# Patient Record
Sex: Female | Born: 1960
Health system: Southern US, Community
[De-identification: ages and names within clinical notes are randomized; demographics above are authoritative.]

## PROBLEM LIST (undated history)

## (undated) DIAGNOSIS — J310 Chronic rhinitis: Secondary | ICD-10-CM

## (undated) DIAGNOSIS — J329 Chronic sinusitis, unspecified: Secondary | ICD-10-CM

## (undated) DIAGNOSIS — R911 Solitary pulmonary nodule: Secondary | ICD-10-CM

## (undated) DIAGNOSIS — Z8719 Personal history of other diseases of the digestive system: Secondary | ICD-10-CM

## (undated) DIAGNOSIS — N201 Calculus of ureter: Secondary | ICD-10-CM

## (undated) DIAGNOSIS — J45909 Unspecified asthma, uncomplicated: Secondary | ICD-10-CM

## (undated) DIAGNOSIS — G4733 Obstructive sleep apnea (adult) (pediatric): Secondary | ICD-10-CM

## (undated) DIAGNOSIS — Z8632 Personal history of gestational diabetes: Secondary | ICD-10-CM

## (undated) DIAGNOSIS — K219 Gastro-esophageal reflux disease without esophagitis: Secondary | ICD-10-CM

## (undated) HISTORY — PX: WISDOM TOOTH EXTRACTION: SHX21

## (undated) HISTORY — PX: TONSILLECTOMY: SUR1361

## (undated) HISTORY — DX: Unspecified asthma, uncomplicated: J45.909

## (undated) HISTORY — DX: Gastro-esophageal reflux disease without esophagitis: K21.9

---

## 2001-05-06 ENCOUNTER — Other Ambulatory Visit: Admission: RE | Admit: 2001-05-06 | Discharge: 2001-05-06 | Payer: Self-pay | Admitting: Family Medicine

## 2005-10-23 ENCOUNTER — Ambulatory Visit: Payer: Self-pay | Admitting: Family Medicine

## 2007-06-15 ENCOUNTER — Ambulatory Visit: Payer: Self-pay | Admitting: Family Medicine

## 2008-11-17 DIAGNOSIS — F419 Anxiety disorder, unspecified: Secondary | ICD-10-CM | POA: Insufficient documentation

## 2008-11-17 DIAGNOSIS — M545 Low back pain, unspecified: Secondary | ICD-10-CM | POA: Insufficient documentation

## 2008-11-17 DIAGNOSIS — K589 Irritable bowel syndrome without diarrhea: Secondary | ICD-10-CM | POA: Insufficient documentation

## 2008-12-06 DIAGNOSIS — E669 Obesity, unspecified: Secondary | ICD-10-CM | POA: Insufficient documentation

## 2008-12-08 DIAGNOSIS — D179 Benign lipomatous neoplasm, unspecified: Secondary | ICD-10-CM | POA: Insufficient documentation

## 2008-12-20 ENCOUNTER — Ambulatory Visit: Payer: Self-pay | Admitting: Family Medicine

## 2009-05-26 ENCOUNTER — Ambulatory Visit: Payer: Self-pay | Admitting: Family Medicine

## 2009-06-30 DIAGNOSIS — J45991 Cough variant asthma: Secondary | ICD-10-CM | POA: Insufficient documentation

## 2010-04-26 DIAGNOSIS — Z86018 Personal history of other benign neoplasm: Secondary | ICD-10-CM

## 2010-04-26 HISTORY — DX: Personal history of other benign neoplasm: Z86.018

## 2010-10-08 ENCOUNTER — Ambulatory Visit: Payer: Self-pay | Admitting: Family Medicine

## 2010-11-19 ENCOUNTER — Ambulatory Visit: Payer: Self-pay | Admitting: Allergy and Immunology

## 2010-11-29 ENCOUNTER — Ambulatory Visit: Payer: Self-pay

## 2011-08-20 ENCOUNTER — Ambulatory Visit: Payer: Self-pay | Admitting: Family Medicine

## 2012-06-03 ENCOUNTER — Ambulatory Visit: Payer: Self-pay | Admitting: Family Medicine

## 2013-03-04 LAB — CBC AND DIFFERENTIAL
HEMATOCRIT: 36 % (ref 36–46)
HEMOGLOBIN: 11 g/dL — AB (ref 12.0–16.0)
NEUTROS ABS: 59 /uL
Platelets: 525 10*3/uL — AB (ref 150–399)
WBC: 11.2 10^3/mL

## 2013-09-03 ENCOUNTER — Other Ambulatory Visit (INDEPENDENT_AMBULATORY_CARE_PROVIDER_SITE_OTHER): Payer: Self-pay | Admitting: Otolaryngology

## 2013-09-03 DIAGNOSIS — J329 Chronic sinusitis, unspecified: Secondary | ICD-10-CM

## 2013-09-08 ENCOUNTER — Encounter: Payer: Self-pay | Admitting: *Deleted

## 2013-09-08 ENCOUNTER — Ambulatory Visit
Admission: RE | Admit: 2013-09-08 | Discharge: 2013-09-08 | Disposition: A | Discharge status: Home / Self Care | Payer: BC Managed Care – PPO | Source: Ambulatory Visit | Attending: Otolaryngology | Admitting: Otolaryngology

## 2013-09-08 DIAGNOSIS — J329 Chronic sinusitis, unspecified: Secondary | ICD-10-CM

## 2013-09-09 ENCOUNTER — Encounter: Payer: Self-pay | Admitting: Internal Medicine

## 2013-09-09 ENCOUNTER — Ambulatory Visit (INDEPENDENT_AMBULATORY_CARE_PROVIDER_SITE_OTHER): Payer: BC Managed Care – PPO | Admitting: Internal Medicine

## 2013-09-09 VITALS — BP 130/84 | HR 74 | Ht 70.0 in | Wt 214.0 lb

## 2013-09-09 DIAGNOSIS — J849 Interstitial pulmonary disease, unspecified: Secondary | ICD-10-CM

## 2013-09-09 DIAGNOSIS — R053 Chronic cough: Secondary | ICD-10-CM

## 2013-09-09 DIAGNOSIS — R05 Cough: Secondary | ICD-10-CM

## 2013-09-09 DIAGNOSIS — R059 Cough, unspecified: Secondary | ICD-10-CM

## 2013-09-09 NOTE — Progress Notes (Signed)
Subjective:    Patient ID: Angela Reyes, female    DOB: 05/10/1961, 52 y.o.   MRN: [id number] PCP No primary provider on file. Referred by Dr Aris Georgia _ allergist HPI   IOV 09/09/2013   Chief Complaint  Patient presents with  . Pulmonary Consult    referred by Dr. Gary Fleet for Asthma    52 year old pleasant female. Referred by Dr. Gary Fleet for recurrent episodes of wheezing. And bronchitis. She reports that 3-4 years ago she presented to Dr. Gary Fleet at that time with chronic cough and shortness of breath. At that time this was in the setting of frequent episodes of bronchitis and wheezing. Apparently spirometry was normal. CT scan of the sinus was normal in 2012. Allergy testing 2-4 years ago at initial intake with Dr. Gary Fleet was normal per her history and per Dr. Gary Fleet e-mail. She started on inhaled corticosteroids but despite this she says that she gets at least 6 episodes of acute bronchitis with wheezing each year requiring antibiotics and prednisone burst.. In between these episodes are not fully clear whether she is fully normal but it appears that she might have some baseline exertional dyspnea and chronic hoarseness of voice. I'm not fully clear if there is chronic cough in between acute episodes because of her variable responses from her. HHowver, looking at RSI cough score between episodes she is having mild cough - COugh score 10 but during episodes cough score si34 and very severe  For  2014 she's had 6 episodes requiring prednisone and antibiotics. Some of this is documented on a piece of paper that she brought along. She gets anywhere from 6-12 days of prednisone and different antibiotics typically Z-Pak and Levaquin. Most recently this is her third episode since August. She is currently finishing a prednisone course.    Other relevant cough history =- Sinus she sees and acid reflux issues present   Relevant investigation history  She has never had a exam nitric oxide because  the machine is in [place] not [place].  She recollects pansinusitis on CT scan of the sinus in 2012. She's had another CT scan of the sinus yesterday in [place] but results are not available  Allergy testing with Dr. Gary Fleet negative a few years ago per history  Normal immunological testing and Dr. Shon Baton office per Dr. Gary Fleet e-mail  Walk test on room air today 185 feet x3 laps. Resting pulse ox 100% with a resting heart rate 71. Peak pulse ox 97% and peak heart rate of 125 suggesting physical deconditioning but no desaturations  Spirometry today: Normal spirometry with FEV1 2.76 L/86%. FEC 3.6 L/89%. Ratio 77/99%.   Dr Gretta Cool Reflux Symptom Index (> 13-15 suggestive of LPR cough) 09/09/2013 bwteen episodes 09/09/2013 During episodes  Hoarseness of problem with voice 2 3  Clearing  Of Throat 2 4  Excess throat mucus or feeling of post nasal drip 1 4  Difficulty swallowing food, liquid or tablets 0 2  Cough after eating or lying down 1 5  Breathing difficulties or choking episodes 1 5  Troublesome or annoying cough 1 5  Sensation of something sticking in throat or lump in throat 1 4  Heartburn, chest pain, indigestion, or stomach acid coming up 1 2  TOTAL 10 34      Past Medical History  Diagnosis Date  . Persistent cough   . GERD (gastroesophageal reflux disease)   . Chronic sinusitis   . Asthma      Family History  Problem Relation Age  of Onset  . Allergies      siblings     History   Social History  . Marital Status: Single    Spouse Name: N/A    Number of Children: N/A  . Years of Education: N/A   Occupational History  . Not on file.   Social History Main Topics  . Smoking status: Never Smoker   . Smokeless tobacco: Not on file  . Alcohol Use: No  . Drug Use: No  . Sexual Activity: Not on file   Other Topics Concern  . Not on file   Social History Narrative  . No narrative on file     No Known Allergies   No outpatient  prescriptions prior to visit.   No facility-administered medications prior to visit.       Review of Systems  Constitutional: Negative for fever, chills, diaphoresis, activity change, appetite change, fatigue and unexpected weight change.  HENT: Positive for congestion and sneezing. Negative for dental problem, ear discharge, ear pain, facial swelling, hearing loss, mouth sores, nosebleeds, postnasal drip, rhinorrhea, sinus pressure, sore throat, tinnitus, trouble swallowing and voice change.   Eyes: Negative for photophobia, discharge, itching and visual disturbance.  Respiratory: Positive for cough and shortness of breath. Negative for apnea, choking, chest tightness, wheezing and stridor.   Cardiovascular: Negative for chest pain, palpitations and leg swelling.  Gastrointestinal: Negative for nausea, vomiting, abdominal pain, constipation, blood in stool and abdominal distention.  Genitourinary: Negative for dysuria, urgency, frequency, hematuria, flank pain, decreased urine volume and difficulty urinating.  Musculoskeletal: Negative for arthralgias, back pain, gait problem, joint swelling, myalgias, neck pain and neck stiffness.  Skin: Negative for color change, pallor and rash.  Neurological: Positive for headaches. Negative for dizziness, tremors, seizures, syncope, speech difficulty, weakness, light-headedness and numbness.  Hematological: Negative for adenopathy. Does not bruise/bleed easily.  Psychiatric/Behavioral: Negative for confusion, sleep disturbance and agitation. The patient is nervous/anxious.        Objective:   Physical Exam  Vitals reviewed. Constitutional: She is oriented to person, place, and time. She appears well-developed and well-nourished. No distress.  Body mass index is 30.71 kg/(m^2).   HENT:  Head: Normocephalic and atraumatic.  Right Ear: External ear normal.  Left Ear: External ear normal.  Mouth/Throat: Oropharynx is clear and moist. No  oropharyngeal exudate.  Chronic hoarse voice + Cleared throat once or twice during exam +  Eyes: Conjunctivae and EOM are normal. Pupils are equal, round, and reactive to light. Right eye exhibits no discharge. Left eye exhibits no discharge. No scleral icterus.  Neck: Normal range of motion. Neck supple. No JVD present. No tracheal deviation present. No thyromegaly present.  Cardiovascular: Normal rate, regular rhythm, normal heart sounds and intact distal pulses.  Exam reveals no gallop and no friction rub.   No murmur heard. Pulmonary/Chest: Effort normal and breath sounds normal. No respiratory distress. She has no wheezes. She has no rales. She exhibits no tenderness.  Abdominal: Soft. Bowel sounds are normal. She exhibits no distension and no mass. There is no tenderness. There is no rebound and no guarding.  Musculoskeletal: Normal range of motion. She exhibits no edema and no tenderness.  Lymphadenopathy:    She has no cervical adenopathy.  Neurological: She is alert and oriented to person, place, and time. She has normal reflexes. No cranial nerve deficit. She exhibits normal muscle tone. Coordination normal.  Skin: Skin is warm and dry. No rash noted. She is not diaphoretic. No erythema. No  pallor.  Psychiatric: She has a normal mood and affect. Her behavior is normal. Judgment and thought content normal.  [place]          Assessment & Plan:

## 2013-09-09 NOTE — Patient Instructions (Addendum)
Unclear cause for your symptoms  I will currently work this up  in frame work of chronic cough and/or recurrent bronchitis/asthma exacerbation  Do full PFT breathing test  Do HRCT chest   High Resolution CT chest without contrast on ILD protocol. Only  Dr Melinda Blietz or Dr. Daniel Entrikin to read  REturn to see me after above mid-dec 2014 

## 2013-09-12 ENCOUNTER — Telehealth: Payer: Self-pay | Admitting: Internal Medicine

## 2013-09-12 DIAGNOSIS — R05 Cough: Secondary | ICD-10-CM | POA: Insufficient documentation

## 2013-09-12 DIAGNOSIS — R053 Chronic cough: Secondary | ICD-10-CM | POA: Insufficient documentation

## 2013-09-12 NOTE — Telephone Encounter (Signed)
Angela Reyes  Please remove ILD as reason for CT chest. REason for CT chest is chronic cough buit it has to be done on HRCT High Resolution CT chest without contrast on ILD protocol. Only  Dr Leanna Battles or Dr. Trudie Reed to read  Indication for test is different from protocol   Thanks  Dr. Kalman Shan, M.D., [facility].C.P Pulmonary and Critical Care Medicine Staff Physician [place] [facility] [place] [facility] Pager: [phone], If no answer or between  15:00h - 7:00h: call [phone]  09/12/2013 2:38 PM

## 2013-09-12 NOTE — Assessment & Plan Note (Signed)
Unclear cause for your symptoms  I will currently work this up  in frame work of chronic cough and/or recurrent bronchitis/asthma exacerbation  Do full PFT breathing test  Do HRCT chest   High Resolution CT chest without contrast on ILD protocol. Only  Dr Leanna Battles or Dr. Trudie Reed to read  REturn to see me after above mid-dec 2014

## 2013-09-14 ENCOUNTER — Ambulatory Visit (INDEPENDENT_AMBULATORY_CARE_PROVIDER_SITE_OTHER)
Admission: RE | Admit: 2013-09-14 | Discharge: 2013-09-14 | Disposition: A | Discharge status: Home / Self Care | Payer: BC Managed Care – PPO | Source: Ambulatory Visit | Attending: Internal Medicine | Admitting: Internal Medicine

## 2013-09-14 ENCOUNTER — Telehealth: Payer: Self-pay | Admitting: Internal Medicine

## 2013-09-14 DIAGNOSIS — J8410 Pulmonary fibrosis, unspecified: Secondary | ICD-10-CM

## 2013-09-14 DIAGNOSIS — J849 Interstitial pulmonary disease, unspecified: Secondary | ICD-10-CM

## 2013-09-14 NOTE — Telephone Encounter (Signed)
Ok np  Dr. Kalman Shan, M.D., F.C.C.P Pulmonary and Critical Care Medicine Staff Physician [place] [facility] [place] [facility] Pager: [phone], If no answer or between  15:00h - 7:00h: call [phone]  09/14/2013 10:41 PM

## 2013-09-14 NOTE — Telephone Encounter (Signed)
Ct without clear cut evidence of reason for her symptoms but good news no fibrosis  One small nodule in RML  reports that she has never smoked. She does not have any smokeless tobacco history on file. Needs fu CT chest in 9 months to rule out cancer but odds are miniscule  Ensure fu to see me aftr PFT; need to see her mid-dec 2014   Dr. Kalman Shan, M.D., [facility].C.P Pulmonary and Critical Care Medicine Staff Physician [place] [facility] [place] [facility] Pager: [phone], If no answer or between  15:00h - 7:00h: call [phone]  09/14/2013 11:30 PM        Ct Chest High Resolution  09/14/2013   CLINICAL DATA:  Newly diagnosed asthma. Recurrent upper respiratory tract infections. Sinusitis. Evaluate for interstitial lung disease.  EXAM: CHEST CT WITHOUT CONTRAST  TECHNIQUE: Multidetector CT imaging of the chest was performed following the standard protocol without intravenous contrast. High resolution imaging of the lungs, as well as inspiratory and expiratory imaging, was performed.  COMPARISON:  No priors.  FINDINGS: Mediastinum: Heart size is normal. There is no significant pericardial fluid, thickening or pericardial calcification. No pathologically enlarged mediastinal or hilar lymph nodes. Please note that accurate exclusion of hilar adenopathy is limited on noncontrast CT scans. Esophagus is unremarkable in appearance.  Lungs/Pleura: 5 mm nodule in the lateral segment of the right middle lobe (image 30 of series 5). 4 mm subpleural nodule in the posterior aspect of the right lower lobe (image 25 of series 5). No other larger more suspicious appearing pulmonary nodules or masses are otherwise noted. No acute consolidative airspace disease. No pleural effusions. High-resolution images demonstrate no areas of significant ground-glass attenuation, subpleural reticulation, parenchymal banding or traction bronchiectasis. Inspiratory and expiratory imaging  demonstrates mild diffuse mosaicism on the expiratory phase, suggesting air trapping related to small airways disease.  Upper Abdomen: Unremarkable.  Musculoskeletal: There are no aggressive appearing lytic or blastic lesions noted in the visualized portions of the skeleton.  IMPRESSION: 1. No findings to suggest an underlying interstitial lung disease. 2. Extensive air trapping indicative of small airways disease. 3. Nonspecific small pulmonary nodules in the right lung, largest of which measures 5 mm in the lateral segment of the right middle lobe. If the patient is at high risk for bronchogenic carcinoma, follow-up chest CT at 6-12 months is recommended. If the patient is at low risk for bronchogenic carcinoma, follow-up chest CT at 12 months is recommended. This recommendation follows the consensus statement: Guidelines for Management of Small Pulmonary Nodules Detected on CT Scans: A Statement from the Fleischner Society as published in Radiology 2005;237:395-400.   Electronically Signed   By: Trudie Reed M.D.   On: 09/14/2013 14:52

## 2013-09-14 NOTE — Telephone Encounter (Signed)
I cannot remove this since CT has already been done. Carron Curie, CMA

## 2013-09-15 NOTE — Telephone Encounter (Signed)
I spoke with the pt and she has an appt for 10-05-13 for PFT at 4pm and then a f/u with MR on 10-11-13 at 10:30. It looks like what happened is the wrong pt was placed in both these slots. A Dr. Maple Hudson pt was scheduled in these slots instead of this pt. I have fixed the schedule and advised the pt to keep the dates she has. Nothing further needed.Carron Curie, CMA

## 2013-09-15 NOTE — Telephone Encounter (Signed)
I spoke with the pt and advised of results. I do not see a PFT scheduled but the pt states she has a date and time for PFT in her calender book. She states she will check this and call me back. The pt needs to be set for a PFT and follow-up with MR after PFT sometime in Mid-Dec 2014, ok to set PFT at [facility] if needed. Will await pt call back. Carron Curie, CMA

## 2013-09-20 ENCOUNTER — Telehealth: Payer: Self-pay | Admitting: Internal Medicine

## 2013-09-20 NOTE — Telephone Encounter (Signed)
Outside spirometry reviewed. Performed 08/24/2013. FEV1 1.9 L/59%. FEC 2.5 L/62% ratio 75/96%. I am not sure that she did a full seconds 6 seconds respiratory maneuver.  Similar restrictive readings on 08/17/2013 with FEV1 1.7 L or 54%. FEC 2.18 L/54%. Pressure of 80/100%. Again it appears that she had her do a whole 6 second maneuver  Dr. Kalman Shan, M.D., [facility].C.P Pulmonary and Critical Care Medicine Staff Physician [place] [facility] [place] [facility] and Critical Care Pager: [phone], If no answer or between  15:00h - 7:00h: call [phone]  09/20/2013 10:22 PM

## 2013-09-20 NOTE — Telephone Encounter (Signed)
This is a self reminder to test the patient for possible vasculitis given findings of CT scan of the sinuses in 09/08/2013 showing acute sinusitis in the sphenoid sinus and sinonasal polyposis in the left maxilla and ethmoid sinus

## 2013-10-05 ENCOUNTER — Ambulatory Visit (INDEPENDENT_AMBULATORY_CARE_PROVIDER_SITE_OTHER): Payer: BC Managed Care – PPO | Admitting: Internal Medicine

## 2013-10-05 DIAGNOSIS — J849 Interstitial pulmonary disease, unspecified: Secondary | ICD-10-CM

## 2013-10-05 DIAGNOSIS — J8410 Pulmonary fibrosis, unspecified: Secondary | ICD-10-CM

## 2013-10-05 LAB — PULMONARY FUNCTION TEST
DL/VA % pred: 91 %
DL/VA: 4.93 ml/min/mmHg/L
FEF 25-75 Pre: 2.75 L/sec
FEF2575-%Change-Post: 16 %
FEF2575-%Pred-Post: 104 %
FEF2575-%Pred-Pre: 89 %
FEV1-%Change-Post: 3 %
FEV1-%Pred-Pre: 83 %
FEV1-Post: 2.9 L
FEV1-Pre: 2.8 L
FEV1FVC-%Change-Post: 3 %
FEV1FVC-%Pred-Pre: 99 %
FEV6-%Change-Post: 0 %
FEV6-%Pred-Post: 84 %
FEV6-%Pred-Pre: 84 %
FVC-Post: 3.5 L
FVC-Pre: 3.51 L
Post FEV1/FVC ratio: 83 %
Post FEV6/FVC ratio: 100 %
Pre FEV6/FVC Ratio: 100 %
RV % pred: 88 %

## 2013-10-05 NOTE — Progress Notes (Signed)
PFT done today. 

## 2013-10-11 ENCOUNTER — Ambulatory Visit (INDEPENDENT_AMBULATORY_CARE_PROVIDER_SITE_OTHER): Payer: BC Managed Care – PPO | Admitting: Internal Medicine

## 2013-10-11 ENCOUNTER — Encounter: Payer: Self-pay | Admitting: Internal Medicine

## 2013-10-11 VITALS — BP 120/80 | HR 75 | Ht 70.0 in | Wt 217.0 lb

## 2013-10-11 DIAGNOSIS — R053 Chronic cough: Secondary | ICD-10-CM

## 2013-10-11 DIAGNOSIS — R05 Cough: Secondary | ICD-10-CM

## 2013-10-11 DIAGNOSIS — R911 Solitary pulmonary nodule: Secondary | ICD-10-CM

## 2013-10-11 DIAGNOSIS — R059 Cough, unspecified: Secondary | ICD-10-CM

## 2013-10-11 NOTE — Patient Instructions (Addendum)
#  Chronic cough  I think you have chronic rhinosinusitis with neurogenic/cyclical cough (also called irritable larynx or LPR cough)  You asthma, depending on control or presence, could be contributing to your cough    For sinus  - continue nasal steroid  - start netti pot nightly with bottled or distilled water  For refulux  - cotninue  nexium  For irritable larynx or cyclical cough  - do voice rest for 2 day  - attend neuro rehab speech therapy by  Mr Sundra Aland  For possible asthma  - come off inhaler advair and qvar  for 2 weeks, singulair for 1week  And then do methacholine challenge test with [place] and Exhaled Nitric Oxide with Dr Gary Fleet  #Lung nodule 5mm RML - very low risk for lung cancer due to small size and non-smokng status  - do CT chest wo contrast in 1 year  #FOllowup   - after methacholine challenge test; approx in 2-4 weeks

## 2013-10-11 NOTE — Progress Notes (Signed)
Subjective:    Patient ID: Angela Reyes, female    DOB: Aug 31, 1961, 52 y.o.   MRN: [id number]  HPI  PCP No primary provider on file. Referred by Dr Hardie Pulley _ allergist HPI   IOV 09/09/2013   Chief Complaint  Patient presents with  . Pulmonary Consult    referred by Dr. Remus Blake for Asthma    52 year old pleasant female. Referred by Dr. Remus Blake for recurrent episodes of wheezing. And bronchitis. She reports that 3-4 years ago she presented to Dr. Remus Blake at that time with chronic cough and shortness of breath. At that time this was in the setting of frequent episodes of bronchitis and wheezing. Apparently spirometry was normal. CT scan of the sinus was normal in 2012. Allergy testing 2-4 years ago at initial intake with Dr. Remus Blake was normal per her history and per Dr. Remus Blake e-mail. She started on inhaled corticosteroids but despite this she says that she gets at least 6 episodes of acute bronchitis with wheezing each year requiring antibiotics and prednisone burst.. In between these episodes are not fully clear whether she is fully normal but it appears that she might have some baseline exertional dyspnea and chronic hoarseness of voice. I'm not fully clear if there is chronic cough in between acute episodes because of her variable responses from her. HHowver, looking at RSI cough score between episodes she is having mild cough - COugh score 10 but during episodes cough score si34 and very severe  For  2014 she's had 6 episodes requiring prednisone and antibiotics. Some of this is documented on a piece of paper that she brought along. She gets anywhere from 6-12 days of prednisone and different antibiotics typically Z-Pak and Levaquin. Most recently this is her third episode since August. She is currently finishing a prednisone course.    Other relevant cough history =- Sinus she sees and acid reflux issues present   Relevant investigation history  She has never had a exhalednitric  oxide because the machine is in [place].  She recollects pansinusitis on CT scan of the sinus in 2012. She's had another CT scan of the sinus yesterday in [place] but results are not available  Allergy testing with Dr. Remus Blake negative a few years ago per history  Normal immunological testing and Dr. Lilli Few office per Dr. Remus Blake e-mail  Walk test on room air today 185 feet x3 laps. Resting pulse ox 100% with a resting heart rate 71. Peak pulse ox 97% and peak heart rate of 125 suggesting physical deconditioning but no desaturations  Spirometry today: Normal spirometry with FEV1 2.76 L/86%. FEC 3.6 L/89%. Ratio 77/99%.   REC Unclear cause for your symptoms I will currently work this up  in frame work of chronic cough and/or recurrent bronchitis/asthma exacerbation Do full PFT breathing test Do HRCT chest   High Resolution CT chest without contrast on ILD protocol. Only  Dr Lorin Picket or Dr. Vinnie Langton to read REturn to see me after above mid-dec 2014  OV 10/11/2013  Chief Complaint  Patient presents with  . Follow-up    to review CT and PFT. Pt states cough is improved.    Ct without clear cut evidence of reason for her symptoms but good news no fibrosis  One small nodule in RML 41mml  reports that she has never smoked. She does not have any smokeless tobacco history on file. Needs fu CT chest in 9 months to rule out cancer but odds are miniscule  Outside spirometry reviewed. Performed 08/24/2013. FEV1 1.9 L/59%. FEC 2.5 L/62% ratio 75/96%. I am not sure that she did a full seconds 6 seconds respiratory maneuver. Similar restrictive readings on 08/17/2013 with FEV1 1.7 L or 54%. FEC 2.18 L/54%. Pressure of 80/100%. Again it appears that she had her do a whole 6 second maneuver  CT scan of the sinuses in 09/08/2013 showing acute sinusitis in the sphenoid sinus and sinonasal polyposis in the left maxilla and ethmoid sinus  Full PFT 10/05/13 -0 Normal  except DLCO borderline low normal at 77%  Dr Gretta Cool Reflux Symptom Index (> 13-15 suggestive of LPR cough)   09/09/2013 bwteen episodes 09/09/2013 During episodes 10/11/2013 Feels baseline and well but sniffing nose  Hoarseness of problem with voice 2 3 3   Clearing  Of Throat 2 4 1   Excess throat mucus or feeling of post nasal drip 1 4 1   Difficulty swallowing food, liquid or tablets 0 2 0  Cough after eating or lying down 1 5 0  Breathing difficulties or choking episodes 1 5 0  Troublesome or annoying cough 1 5 0  Sensation of something sticking in throat or lump in throat 1 4 0  Heartburn, chest pain, indigestion, or stomach acid coming up 1 2 1   TOTAL 10 34 6     Review of Systems  Constitutional: Negative for fever and unexpected weight change.  HENT: Negative for congestion, dental problem, ear pain, nosebleeds, postnasal drip, rhinorrhea, sinus pressure, sneezing, sore throat and trouble swallowing.   Eyes: Negative for redness and itching.  Respiratory: Negative for cough, chest tightness, shortness of breath and wheezing.   Cardiovascular: Negative for palpitations and leg swelling.  Gastrointestinal: Negative for nausea and vomiting.  Genitourinary: Negative for dysuria.  Musculoskeletal: Negative for joint swelling.  Skin: Negative for rash.  Neurological: Negative for headaches.  Hematological: Does not bruise/bleed easily.  Psychiatric/Behavioral: Negative for dysphoric mood. The patient is not nervous/anxious.    Current outpatient prescriptions:albuterol (PROAIR HFA) 108 (90 BASE) MCG/ACT inhaler, Inhale 2 puffs into the lungs every 6 (six) hours as needed for wheezing or shortness of breath., Disp: , Rfl: ;  albuterol (PROVENTIL) (2.5 MG/3ML) 0.083% nebulizer solution, Take 2.5 mg by nebulization every 6 (six) hours as needed for wheezing or shortness of breath., Disp: , Rfl:  beclomethasone (QVAR) 80 MCG/ACT inhaler, Inhale 2 puffs into the lungs 2 (two) times  daily., Disp: , Rfl: ;  cetirizine (ZYRTEC) 10 MG tablet, Take 10 mg by mouth daily., Disp: , Rfl: ;  esomeprazole (NEXIUM) 40 MG capsule, Take 40 mg by mouth daily at 12 noon., Disp: , Rfl: ;  fluticasone-salmeterol (ADVAIR HFA) 230-21 MCG/ACT inhaler, Inhale 2 puffs into the lungs 2 (two) times daily., Disp: , Rfl:  mometasone (NASONEX) 50 MCG/ACT nasal spray, Place 2 sprays into the nose daily., Disp: , Rfl: ;  montelukast (SINGULAIR) 10 MG tablet, Take 10 mg by mouth at bedtime., Disp: , Rfl:      Objective:   Physical Exam  disucssion only visit Filed Vitals:   10/11/13 1024  Height: 5\' 10"  (1.778 m)  Weight: 217 lb (98.431 kg)   Denies chronic cough but constantly sniffiging and occ clearing throat      Assessment & Plan:

## 2013-10-14 DIAGNOSIS — R911 Solitary pulmonary nodule: Secondary | ICD-10-CM | POA: Insufficient documentation

## 2013-10-14 NOTE — Assessment & Plan Note (Signed)
#  Lung nodule 5mm RML - very low risk for lung cancer due to small size and non-smokng status  - do CT chest wo contrast in 1 year

## 2013-10-14 NOTE — Assessment & Plan Note (Addendum)
#  Chronic cough  I think you have chronic rhinosinusitis with neurogenic/cyclical cough (also called irritable larynx or LPR cough)  You asthma, depending on control or presence, could be contributing to your cough    For sinus  - continue nasal steroid  - start netti pot nightly with bottled or distilled water  For refulux  - cotninue  nexium  For irritable larynx or cyclical cough  - do voice rest for 2 day  - attend neuro rehab speech therapy by  Mr Sundra Aland  For possible asthma  - come off inhaler advair and qvar  for 2 weeks, singulair for 1week  And then do methacholine challenge test with [place] and Exhaled Nitric Oxide with Dr Gary Fleet   #FOllowup   - after methacholine challenge test; approx in 2-4 weeks   (> 50% of this 15 min visit spent in face to face counseling)

## 2013-10-26 ENCOUNTER — Encounter (HOSPITAL_COMMUNITY): Payer: BC Managed Care – PPO

## 2013-10-27 ENCOUNTER — Ambulatory Visit (HOSPITAL_COMMUNITY)
Admission: RE | Admit: 2013-10-27 | Discharge: 2013-10-27 | Disposition: A | Discharge status: Home / Self Care | Payer: BC Managed Care – PPO | Source: Ambulatory Visit | Attending: Internal Medicine | Admitting: Internal Medicine

## 2013-10-27 DIAGNOSIS — R059 Cough, unspecified: Secondary | ICD-10-CM | POA: Insufficient documentation

## 2013-10-27 DIAGNOSIS — R053 Chronic cough: Secondary | ICD-10-CM

## 2013-10-27 DIAGNOSIS — R062 Wheezing: Secondary | ICD-10-CM | POA: Insufficient documentation

## 2013-10-27 DIAGNOSIS — R05 Cough: Secondary | ICD-10-CM | POA: Insufficient documentation

## 2013-10-27 LAB — PULMONARY FUNCTION TEST
FEF 25-75 POST: 1.69 L/s
FEF 25-75 Pre: 1.58 L/sec
FEF2575-%CHANGE-POST: 6 %
FEF2575-%PRED-POST: 57 %
FEF2575-%Pred-Pre: 53 %
FEV1-%CHANGE-POST: 2 %
FEV1-%PRED-POST: 71 %
FEV1-%Pred-Pre: 70 %
FEV1-POST: 2.28 L
FEV1-PRE: 2.24 L
FEV1FVC-%CHANGE-POST: 0 %
FEV1FVC-%PRED-PRE: 90 %
FEV6-%Change-Post: 1 %
FEV6-%Pred-Post: 80 %
FEV6-%Pred-Pre: 78 %
FEV6-Post: 3.13 L
FEV6-Pre: 3.07 L
FEV6FVC-%Change-Post: 0 %
FEV6FVC-%PRED-PRE: 101 %
FEV6FVC-%Pred-Post: 102 %
FVC-%Change-Post: 1 %
FVC-%Pred-Post: 78 %
FVC-%Pred-Pre: 77 %
FVC-Post: 3.15 L
FVC-Pre: 3.1 L
POST FEV1/FVC RATIO: 73 %
PRE FEV1/FVC RATIO: 72 %
PRE FEV6/FVC RATIO: 99 %
Post FEV6/FVC ratio: 99 %

## 2013-10-27 MED ORDER — METHACHOLINE 1 MG/ML NEB SOLN
2.0000 mL | Freq: Once | RESPIRATORY_TRACT | Status: AC
  Administered 2013-10-27: 2 mg via RESPIRATORY_TRACT

## 2013-10-27 MED ORDER — METHACHOLINE 0.25 MG/ML NEB SOLN
2.0000 mL | Freq: Once | RESPIRATORY_TRACT | Status: AC
  Administered 2013-10-27: 0.5 mg via RESPIRATORY_TRACT

## 2013-10-27 MED ORDER — METHACHOLINE 4 MG/ML NEB SOLN: 2.0000 mL | Freq: Once | RESPIRATORY_TRACT | Status: DC

## 2013-10-27 MED ORDER — SODIUM CHLORIDE 0.9 % IN NEBU
3.0000 mL | INHALATION_SOLUTION | Freq: Once | RESPIRATORY_TRACT | Status: AC
  Administered 2013-10-27: 3 mL via RESPIRATORY_TRACT

## 2013-10-27 MED ORDER — METHACHOLINE 0.0625 MG/ML NEB SOLN
2.0000 mL | Freq: Once | RESPIRATORY_TRACT | Status: AC
  Administered 2013-10-27: 0.125 mg via RESPIRATORY_TRACT

## 2013-10-27 MED ORDER — ALBUTEROL SULFATE (2.5 MG/3ML) 0.083% IN NEBU
2.5000 mg | INHALATION_SOLUTION | Freq: Once | RESPIRATORY_TRACT | Status: AC
  Administered 2013-10-27: 2.5 mg via RESPIRATORY_TRACT

## 2013-10-27 MED ORDER — METHACHOLINE 16 MG/ML NEB SOLN: 2.0000 mL | Freq: Once | RESPIRATORY_TRACT | Status: DC

## 2013-11-03 ENCOUNTER — Ambulatory Visit: Payer: BC Managed Care – PPO

## 2013-11-09 ENCOUNTER — Encounter: Payer: Self-pay | Admitting: Internal Medicine

## 2013-11-09 ENCOUNTER — Ambulatory Visit (INDEPENDENT_AMBULATORY_CARE_PROVIDER_SITE_OTHER): Payer: BC Managed Care – PPO | Admitting: Internal Medicine

## 2013-11-09 VITALS — BP 132/82 | HR 80 | Ht 70.0 in | Wt 217.2 lb

## 2013-11-09 DIAGNOSIS — R059 Cough, unspecified: Secondary | ICD-10-CM

## 2013-11-09 DIAGNOSIS — R911 Solitary pulmonary nodule: Secondary | ICD-10-CM

## 2013-11-09 DIAGNOSIS — R05 Cough: Secondary | ICD-10-CM

## 2013-11-09 DIAGNOSIS — R053 Chronic cough: Secondary | ICD-10-CM

## 2013-11-09 NOTE — Assessment & Plan Note (Signed)
21mm RML lung nodule end 2014. Will do fu CT end 2015 in this non-smoker

## 2013-11-09 NOTE — Assessment & Plan Note (Signed)
#  Chronic cough /ecurrent flare ups  I think her pprimary problems is non-allergic astha and chronic sinusitis resulting in recurrent flare ups. Interesting question is how well her asthma is controlled on advair + Qvar. She has normal baseline spirometry. Therfore, monitoring her with exhaled nitric oxidie would be most optimal in judging asthma control. If while on inhalers her exhaled NO is high, then she could benefit from a change in inhaler strategy. On other hand, if asthma is well controlled through FeNO measurement, then reducing her inhalers could potentially benefit her from reduced steroid load on the vocal cord. Only Dr Remus Blake has FeNO testing, so she best served by regular followup with her  My instructions to her   - this is due to chronic rhino-sinusitis and definite asthma  -optimal control of both is important in preventing flare ups -  in addition, long terms inhaled steroid use could potentially be contributing to some throat and voice issues - For sinus: follow ENT advice - For asthma: I would highly recommend asthma monitoring and management through monitoring of your Exhaled Nitric Oxide - goal <25 and this will allow you to make sure you are on optimal controller inhalers. This can be done by Dr Remus Blake

## 2013-11-09 NOTE — Progress Notes (Signed)
Subjective:    Patient ID: Angela Reyes, female    DOB: Aug 31, 1961, 53 y.o.   MRN: [id number]  HPI  PCP No primary provider on file. Referred by Dr Hardie Pulley _ allergist HPI   IOV 09/09/2013   Chief Complaint  Patient presents with  . Pulmonary Consult    referred by Dr. Remus Blake for Asthma    53 year old pleasant female. Referred by Dr. Remus Blake for recurrent episodes of wheezing. And bronchitis. She reports that 3-4 years ago she presented to Dr. Remus Blake at that time with chronic cough and shortness of breath. At that time this was in the setting of frequent episodes of bronchitis and wheezing. Apparently spirometry was normal. CT scan of the sinus was normal in 2012. Allergy testing 2-4 years ago at initial intake with Dr. Remus Blake was normal per her history and per Dr. Remus Blake e-mail. She started on inhaled corticosteroids but despite this she says that she gets at least 6 episodes of acute bronchitis with wheezing each year requiring antibiotics and prednisone burst.. In between these episodes are not fully clear whether she is fully normal but it appears that she might have some baseline exertional dyspnea and chronic hoarseness of voice. I'm not fully clear if there is chronic cough in between acute episodes because of her variable responses from her. HHowver, looking at RSI cough score between episodes she is having mild cough - COugh score 10 but during episodes cough score si34 and very severe  For  2014 she's had 6 episodes requiring prednisone and antibiotics. Some of this is documented on a piece of paper that she brought along. She gets anywhere from 6-12 days of prednisone and different antibiotics typically Z-Pak and Levaquin. Most recently this is her third episode since August. She is currently finishing a prednisone course.    Other relevant cough history =- Sinus she sees and acid reflux issues present   Relevant investigation history  She has never had a exhalednitric  oxide because the machine is in [place].  She recollects pansinusitis on CT scan of the sinus in 2012. She's had another CT scan of the sinus yesterday in [place] but results are not available  Allergy testing with Dr. Remus Blake negative a few years ago per history  Normal immunological testing and Dr. Lilli Few office per Dr. Remus Blake e-mail  Walk test on room air today 185 feet x3 laps. Resting pulse ox 100% with a resting heart rate 71. Peak pulse ox 97% and peak heart rate of 125 suggesting physical deconditioning but no desaturations  Spirometry today: Normal spirometry with FEV1 2.76 L/86%. FEC 3.6 L/89%. Ratio 77/99%.   REC Unclear cause for your symptoms I will currently work this up  in frame work of chronic cough and/or recurrent bronchitis/asthma exacerbation Do full PFT breathing test Do HRCT chest   High Resolution CT chest without contrast on ILD protocol. Only  Dr Lorin Picket or Dr. Vinnie Langton to read REturn to see me after above mid-dec 2014  OV 10/11/2013  Chief Complaint  Patient presents with  . Follow-up    to review CT and PFT. Pt states cough is improved.    Ct without clear cut evidence of reason for her symptoms but good news no fibrosis  One small nodule in RML 41mml  reports that she has never smoked. She does not have any smokeless tobacco history on file. Needs fu CT chest in 9 months to rule out cancer but odds are miniscule  Outside spirometry reviewed. Performed 08/24/2013. FEV1 1.9 L/59%. FEC 2.5 L/62% ratio 75/96%. I am not sure that she did a full seconds 6 seconds respiratory maneuver. Similar restrictive readings on 08/17/2013 with FEV1 1.7 L or 54%. FEC 2.18 L/54%. Pressure of 80/100%. Again it appears that she had her do a whole 6 second maneuver  CT scan of the sinuses in 09/08/2013 showing acute sinusitis in the sphenoid sinus and sinonasal polyposis in the left maxilla and ethmoid sinus  Full PFT 10/05/13 -0 Normal  except DLCO borderline low normal at 77%  REC #Chronic cough  I think you have chronic rhinosinusitis with neurogenic/cyclical cough (also called irritable larynx or LPR cough)  You asthma, depending on control or presence, could be contributing to your cough    For sinus  - continue nasal steroid  - start netti pot nightly with bottled or distilled water  For refulux  - cotninue  nexium  For irritable larynx or cyclical cough  - do voice rest for 2 day  - attend neuro rehab speech therapy by  Mr Valentino Saxon  For possible asthma  - come off inhaler advair and qvar  for 2 weeks, singulair for 1week  And then do methacholine challenge test with [place] and Exhaled Nitric Oxide with Dr Remus Blake  #Lung nodule 37mm RML - very low risk for lung cancer due to small size and non-smokng status  - do CT chest wo contrast in 1 year  #FOllowup   - after methacholine challenge test; approx in 2-4 weeks   OV 11/09/2013  Chief Complaint  Patient presents with  . Cough    follow-up after methacholine. Pt states she has a scratchy feeling i nher throat that causes her to cough more.  Nitric Oxide was 104.    FU recurrent episodes of wheezing. To followup tests for asthma: Got email from Dr Remus Blake - her exhaled NO was 104 when off advair, qvar and singular. Methacholine challenge test 10/27/13 when off all these mediations was POSITIVE FOR ASTHMA at < 0.3 dose. She decided not to attend speech RX due to cost after spending lots of money during holidays. Of note, when off mdi she felt her chest to be very tight but now back on it and currently feels well and baseline. No new issues. Reviewing all her data, I am not sure anymore that she has the problem of chronic cough but she does have chronic hoarseness (has been on inhaled steroids for years)  Dr Lorenza Cambridge Reflux Symptom Index (> 13-15 suggestive of LPR cough)   09/09/2013 bwteen episodes 09/09/2013 During episodes 10/11/2013 Feels baseline and well but  sniffing nose  Hoarseness of problem with voice 2 3 3   Clearing  Of Throat 2 4 1   Excess throat mucus or feeling of post nasal drip 1 4 1   Difficulty swallowing food, liquid or tablets 0 2 0  Cough after eating or lying down 1 5 0  Breathing difficulties or choking episodes 1 5 0  Troublesome or annoying cough 1 5 0  Sensation of something sticking in throat or lump in throat 1 4 0  Heartburn, chest pain, indigestion, or stomach acid coming up 1 2 1   TOTAL 10 34 6    Past, Family, Social reviewed: no change since last visit   Review of Systems  Constitutional: Negative for fever and unexpected weight change.  HENT: Negative for congestion, dental problem, ear pain, nosebleeds, postnasal drip, rhinorrhea, sinus pressure, sneezing, sore throat and trouble swallowing.  Eyes: Negative for redness and itching.  Respiratory: Positive for cough. Negative for chest tightness, shortness of breath and wheezing.   Cardiovascular: Negative for palpitations and leg swelling.  Gastrointestinal: Negative for nausea and vomiting.  Genitourinary: Negative for dysuria.  Musculoskeletal: Negative for joint swelling.  Skin: Negative for rash.  Neurological: Negative for headaches.  Hematological: Does not bruise/bleed easily.  Psychiatric/Behavioral: Negative for dysphoric mood. The patient is not nervous/anxious.    Current outpatient prescriptions:albuterol (PROAIR HFA) 108 (90 BASE) MCG/ACT inhaler, Inhale 2 puffs into the lungs every 6 (six) hours as needed for wheezing or shortness of breath., Disp: , Rfl: ;  albuterol (PROVENTIL) (2.5 MG/3ML) 0.083% nebulizer solution, Take 2.5 mg by nebulization every 6 (six) hours as needed for wheezing or shortness of breath., Disp: , Rfl:  beclomethasone (QVAR) 80 MCG/ACT inhaler, Inhale 2 puffs into the lungs 2 (two) times daily., Disp: , Rfl: ;  cetirizine (ZYRTEC) 10 MG tablet, Take 10 mg by mouth daily., Disp: , Rfl: ;  esomeprazole (NEXIUM) 40 MG capsule,  Take 40 mg by mouth daily at 12 noon., Disp: , Rfl: ;  fluticasone-salmeterol (ADVAIR HFA) 230-21 MCG/ACT inhaler, Inhale 2 puffs into the lungs 2 (two) times daily., Disp: , Rfl:  mometasone (NASONEX) 50 MCG/ACT nasal spray, Place 2 sprays into the nose daily., Disp: , Rfl: ;  montelukast (SINGULAIR) 10 MG tablet, Take 10 mg by mouth at bedtime., Disp: , Rfl:      Objective:   Physical Exam  Vitals reviewed. Constitutional: She is oriented to person, place, and time. She appears well-developed and well-nourished. No distress.  HENT:  Head: Normocephalic and atraumatic.  Right Ear: External ear normal.  Left Ear: External ear normal.  Mouth/Throat: Oropharynx is clear and moist. No oropharyngeal exudate.  Eyes: Conjunctivae and EOM are normal. Pupils are equal, round, and reactive to light. Right eye exhibits no discharge. Left eye exhibits no discharge. No scleral icterus.  Neck: Normal range of motion. Neck supple. No JVD present. No tracheal deviation present. No thyromegaly present.  Cardiovascular: Normal rate, regular rhythm, normal heart sounds and intact distal pulses.  Exam reveals no gallop and no friction rub.   No murmur heard. Pulmonary/Chest: Effort normal. No respiratory distress. She has no wheezes. She exhibits no tenderness.  Abdominal: Soft. Bowel sounds are normal. She exhibits no distension and no mass. There is no tenderness. There is no rebound and no guarding.  Musculoskeletal: Normal range of motion. She exhibits no edema and no tenderness.  Lymphadenopathy:    She has no cervical adenopathy.  Neurological: She is alert and oriented to person, place, and time. She has normal reflexes. No cranial nerve deficit. She exhibits normal muscle tone. Coordination normal.  Skin: Skin is warm and dry. No rash noted. She is not diaphoretic. No erythema. No pallor.  Psychiatric: She has a normal mood and affect. Her behavior is normal. Judgment and thought content normal.           Assessment & Plan:  #Chronic cough /ecurrent flare ups  I think her pprimary problems is non-allergic astha and chronic sinusitis resulting in recurrent flare ups. Interesting question is how well her asthma is controlled on advair + Qvar. She has normal baseline spirometry. Therfore, monitoring her with exhaled nitric oxidie would be most optimal in judging asthma control. If while on inhalers her exhaled NO is high, then she could benefit from a change in inhaler strategy. On other hand, if asthma is well controlled through  FeNO measurement, then reducing her inhalers could potentially benefit her from reduced steroid load on the vocal cord. Only Dr Remus Blake has FeNO testing, so she best served by regular followup with her  My instructions to her   - this is due to chronic rhino-sinusitis and definite asthma  -optimal control of both is important in preventing flare ups -  in addition, long terms inhaled steroid use could potentially be contributing to some throat and voice issues - For sinus: follow ENT advice - For asthma: I would highly recommend asthma monitoring and management through monitoring of your Exhaled Nitric Oxide - goal <25 and this will allow you to make sure you are on optimal controller inhalers. This can be done by Dr Remus Blake  #22mm RML lung nodule   - fu CT end 2015 and ROV with Dr Chase Caller  > 50% of this > 25 min visit spent in face to face counseling

## 2013-11-09 NOTE — Patient Instructions (Addendum)
#  Lung nodule 41mm RML - very low risk for lung cancer due to small size and non-smokng status  - do CT chest wo contrast in 1 year; already ordered  #Chronic cough /ecurrent flare ups  - this is due to chronic rhino-sinusitis and definite asthma  -optimal control of both is important in preventing flare ups -  in addition, long terms inhaled steroid use could potentially be contributing to some throat and voice issues - For sinus: follow ENT advice - For asthma: I would highly recommend asthma monitoring and management through monitoring of your Exhaled Nitric Oxide - goal <25 and this will allow you to make sure you are on optimal controller inhalers. This can be done by Dr Remus Blake   #FOllowup  - Dr Remus Blake per her schedule for asthma  - 1 year with me for CT Scan for nodule

## 2014-01-27 DIAGNOSIS — J45909 Unspecified asthma, uncomplicated: Secondary | ICD-10-CM | POA: Insufficient documentation

## 2014-01-27 DIAGNOSIS — J4550 Severe persistent asthma, uncomplicated: Secondary | ICD-10-CM | POA: Insufficient documentation

## 2014-04-21 ENCOUNTER — Ambulatory Visit: Payer: Self-pay | Admitting: Family Medicine

## 2014-04-28 ENCOUNTER — Ambulatory Visit: Payer: Self-pay | Admitting: Family Medicine

## 2014-09-26 ENCOUNTER — Inpatient Hospital Stay: Admission: RE | Admit: 2014-09-26 | Payer: BC Managed Care – PPO | Source: Ambulatory Visit

## 2014-11-21 LAB — HM PAP SMEAR: HM PAP: NORMAL

## 2015-03-06 DIAGNOSIS — D649 Anemia, unspecified: Secondary | ICD-10-CM | POA: Insufficient documentation

## 2015-03-06 DIAGNOSIS — Z7952 Long term (current) use of systemic steroids: Secondary | ICD-10-CM | POA: Insufficient documentation

## 2015-03-06 DIAGNOSIS — N926 Irregular menstruation, unspecified: Secondary | ICD-10-CM | POA: Insufficient documentation

## 2015-03-06 DIAGNOSIS — Z9189 Other specified personal risk factors, not elsewhere classified: Secondary | ICD-10-CM | POA: Insufficient documentation

## 2015-03-06 DIAGNOSIS — K219 Gastro-esophageal reflux disease without esophagitis: Secondary | ICD-10-CM | POA: Insufficient documentation

## 2015-03-06 DIAGNOSIS — K644 Residual hemorrhoidal skin tags: Secondary | ICD-10-CM | POA: Insufficient documentation

## 2015-03-06 DIAGNOSIS — M722 Plantar fascial fibromatosis: Secondary | ICD-10-CM | POA: Insufficient documentation

## 2015-05-01 ENCOUNTER — Other Ambulatory Visit: Payer: Self-pay | Admitting: Family Medicine

## 2015-05-01 DIAGNOSIS — J309 Allergic rhinitis, unspecified: Secondary | ICD-10-CM | POA: Insufficient documentation

## 2015-05-01 MED ORDER — MOMETASONE FUROATE 50 MCG/ACT NA SUSP: 2.0000 | Freq: Every day | NASAL | Status: DC

## 2015-05-01 MED ORDER — MONTELUKAST SODIUM 10 MG PO TABS: 10.0000 mg | ORAL_TABLET | Freq: Every day | ORAL | Status: DC

## 2015-05-01 NOTE — Telephone Encounter (Signed)
Pt contacted office for refill request on the following medications:  esomeprazole (NEXIUM) 40 MG capsule and montelukast (SINGULAIR) 10 MG tablet.  [facility].  CB#[phone]/[id number]

## 2015-05-03 ENCOUNTER — Other Ambulatory Visit: Payer: Self-pay

## 2015-05-03 DIAGNOSIS — K219 Gastro-esophageal reflux disease without esophagitis: Secondary | ICD-10-CM

## 2015-05-03 MED ORDER — ESOMEPRAZOLE MAGNESIUM 40 MG PO CPDR: 40.0000 mg | DELAYED_RELEASE_CAPSULE | Freq: Every day | ORAL | Status: DC

## 2015-05-09 ENCOUNTER — Emergency Department: Payer: BLUE CROSS/BLUE SHIELD

## 2015-05-09 ENCOUNTER — Emergency Department
Admission: EM | Admit: 2015-05-09 | Discharge: 2015-05-09 | Disposition: A | Discharge status: Home / Self Care | Payer: BLUE CROSS/BLUE SHIELD | Attending: Emergency Medicine | Admitting: Emergency Medicine

## 2015-05-09 ENCOUNTER — Encounter: Payer: Self-pay | Admitting: Medical Oncology

## 2015-05-09 DIAGNOSIS — R109 Unspecified abdominal pain: Secondary | ICD-10-CM

## 2015-05-09 DIAGNOSIS — Z79899 Other long term (current) drug therapy: Secondary | ICD-10-CM | POA: Diagnosis not present

## 2015-05-09 DIAGNOSIS — Z7951 Long term (current) use of inhaled steroids: Secondary | ICD-10-CM | POA: Insufficient documentation

## 2015-05-09 DIAGNOSIS — N200 Calculus of kidney: Secondary | ICD-10-CM | POA: Diagnosis not present

## 2015-05-09 LAB — CBC
HCT: 37 % (ref 35.0–47.0)
HEMOGLOBIN: 11.8 g/dL — AB (ref 12.0–16.0)
MCH: 24.6 pg — ABNORMAL LOW (ref 26.0–34.0)
MCHC: 32 g/dL (ref 32.0–36.0)
MCV: 76.8 fL — ABNORMAL LOW (ref 80.0–100.0)
PLATELETS: 309 10*3/uL (ref 150–440)
RBC: 4.82 MIL/uL (ref 3.80–5.20)
RDW: 17.7 % — ABNORMAL HIGH (ref 11.5–14.5)
WBC: 9 10*3/uL (ref 3.6–11.0)

## 2015-05-09 LAB — COMPREHENSIVE METABOLIC PANEL
ALT: 15 U/L (ref 14–54)
AST: 19 U/L (ref 15–41)
Albumin: 3.8 g/dL (ref 3.5–5.0)
Alkaline Phosphatase: 95 U/L (ref 38–126)
Anion gap: 6 (ref 5–15)
BUN: 9 mg/dL (ref 6–20)
CO2: 23 mmol/L (ref 22–32)
CREATININE: 0.75 mg/dL (ref 0.44–1.00)
Calcium: 8.5 mg/dL — ABNORMAL LOW (ref 8.9–10.3)
Chloride: 108 mmol/L (ref 101–111)
GFR calc Af Amer: >60 mL/min (ref >60)
Glucose, Bld: 92 mg/dL (ref 65–99)
POTASSIUM: 3.7 mmol/L (ref 3.5–5.1)
SODIUM: 137 mmol/L (ref 135–145)
Total Bilirubin: 0.3 mg/dL (ref 0.3–1.2)
Total Protein: 7.1 g/dL (ref 6.5–8.1)

## 2015-05-09 LAB — URINALYSIS COMPLETE WITH MICROSCOPIC (ARMC ONLY)
Bilirubin Urine: NEGATIVE
Glucose, UA: NEGATIVE mg/dL
Ketones, ur: NEGATIVE mg/dL
Nitrite: NEGATIVE
Protein, ur: NEGATIVE mg/dL
SPECIFIC GRAVITY, URINE: 1.006 (ref 1.005–1.030)
pH: 7 (ref 5.0–8.0)

## 2015-05-09 LAB — LIPASE, BLOOD: Lipase: 17 U/L — ABNORMAL LOW (ref 22–51)

## 2015-05-09 MED ORDER — SODIUM CHLORIDE 0.9 % IV BOLUS (SEPSIS)
1000.0000 mL | Freq: Once | INTRAVENOUS | Status: AC
  Administered 2015-05-09: 1000 mL via INTRAVENOUS

## 2015-05-09 MED ORDER — TAMSULOSIN HCL 0.4 MG PO CAPS: 0.4000 mg | ORAL_CAPSULE | Freq: Every day | ORAL | Status: DC

## 2015-05-09 MED ORDER — KETOROLAC TROMETHAMINE 30 MG/ML IJ SOLN
15.0000 mg | Freq: Once | INTRAMUSCULAR | Status: AC
  Administered 2015-05-09: 15 mg via INTRAVENOUS
  Filled 2015-05-09: qty 1

## 2015-05-09 MED ORDER — ONDANSETRON 4 MG PO TBDP: 4.0000 mg | ORAL_TABLET | Freq: Four times a day (QID) | ORAL | Status: DC | PRN

## 2015-05-09 MED ORDER — CEFTRIAXONE SODIUM IN DEXTROSE 20 MG/ML IV SOLN: 1.0000 g | Freq: Once | INTRAVENOUS | Status: DC

## 2015-05-09 MED ORDER — HYDROCODONE-ACETAMINOPHEN 5-325 MG PO TABS: 1.0000 | ORAL_TABLET | Freq: Four times a day (QID) | ORAL | Status: DC | PRN

## 2015-05-09 MED ORDER — CEPHALEXIN 500 MG PO CAPS: 500.0000 mg | ORAL_CAPSULE | Freq: Two times a day (BID) | ORAL | Status: DC

## 2015-05-09 MED ORDER — DEXTROSE 5 % IV SOLN
1.0000 g | Freq: Once | INTRAVENOUS | Status: AC
  Administered 2015-05-09: 1 g via INTRAVENOUS
  Filled 2015-05-09: qty 10

## 2015-05-09 MED ORDER — ONDANSETRON HCL 4 MG/2ML IJ SOLN
4.0000 mg | INTRAMUSCULAR | Status: AC
  Administered 2015-05-09: 4 mg via INTRAVENOUS
  Filled 2015-05-09: qty 2

## 2015-05-09 NOTE — ED Notes (Signed)
Pt reports waking up this am around 0430 with severe rt lower abd pain that has now began radiating into rt flank. Pt also reports nausea and vomiting.

## 2015-05-09 NOTE — Discharge Instructions (Signed)
Kidney Stones  These follow-up with urology as we discussed by the end of this week.  Kidney stones (urolithiasis) are deposits that form inside your kidneys. The intense pain is caused by the stone moving through the urinary tract. When the stone moves, the ureter goes into spasm around the stone. The stone is usually passed in the urine.  CAUSES   A disorder that makes certain neck glands produce too much parathyroid hormone (primary hyperparathyroidism).  A buildup of uric acid crystals, similar to gout in your joints.  Narrowing (stricture) of the ureter.  A kidney obstruction present at birth (congenital obstruction).  Previous surgery on the kidney or ureters.  Numerous kidney infections. SYMPTOMS   Feeling sick to your stomach (nauseous).  Throwing up (vomiting).  Blood in the urine (hematuria).  Pain that usually spreads (radiates) to the groin.  Frequency or urgency of urination. DIAGNOSIS   Taking a history and physical exam.  Blood or urine tests.  CT scan.  Occasionally, an examination of the inside of the urinary bladder (cystoscopy) is performed. TREATMENT   Observation.  Increasing your fluid intake.  Extracorporeal shock wave lithotripsy--This is a noninvasive procedure that uses shock waves to break up kidney stones.  Surgery may be needed if you have severe pain or persistent obstruction. There are various surgical procedures. Most of the procedures are performed with the use of small instruments. Only small incisions are needed to accommodate these instruments, so recovery time is minimized. The size, location, and chemical composition are all important variables that will determine the proper choice of action for you. Talk to your health care provider to better understand your situation so that you will minimize the risk of injury to yourself and your kidney.  HOME CARE INSTRUCTIONS   Drink enough water and fluids to keep your urine clear or pale  yellow. This will help you to pass the stone or stone fragments.  Strain all urine through the provided strainer. Keep all particulate matter and stones for your health care provider to see. The stone causing the pain may be as small as a grain of salt. It is very important to use the strainer each and every time you pass your urine. The collection of your stone will allow your health care provider to analyze it and verify that a stone has actually passed. The stone analysis will often identify what you can do to reduce the incidence of recurrences.  Only take over-the-counter or prescription medicines for pain, discomfort, or fever as directed by your health care provider.  Make a follow-up appointment with your health care provider as directed.  Get follow-up X-rays if required. The absence of pain does not always mean that the stone has passed. It may have only stopped moving. If the urine remains completely obstructed, it can cause loss of kidney function or even complete destruction of the kidney. It is your responsibility to make sure X-rays and follow-ups are completed. Ultrasounds of the kidney can show blockages and the status of the kidney. Ultrasounds are not associated with any radiation and can be performed easily in a matter of minutes. SEEK MEDICAL CARE IF:  You experience pain that is progressive and unresponsive to any pain medicine you have been prescribed. SEEK IMMEDIATE MEDICAL CARE IF:   Pain cannot be controlled with the prescribed medicine.  You have a fever or shaking chills.  The severity or intensity of pain increases over 18 hours and is not relieved by pain medicine.  You develop a new onset of abdominal pain.  You feel faint or pass out.  You are unable to urinate. MAKE SURE YOU:   Understand these instructions.  Will watch your condition.  Will get help right away if you are not doing well or get worse. Document Released: 10/07/2005 Document Revised:  06/09/2013 Document Reviewed: 03/10/2013 [facility] Patient Information 2015 [place], [place]. This information is not intended to replace advice given to you by your health care provider. Make sure you discuss any questions you have with your health care provider.

## 2015-05-09 NOTE — ED Provider Notes (Signed)
[facility] Emergency Department Provider Note  ____________________________________________  Time seen: Approximately 8:33 AM  I have reviewed the triage vital signs and the nursing notes.   HISTORY  Chief Complaint Flank Pain and Abdominal Pain    HPI Angela Reyes is a 54 y.o. female with a history of asthma who woke up at about 4:30 this morning with severe right flank pain. This pain then seemed to move a little bit down towards her right lower abdomen and is now shooting from the right lower flank into her right "kidney". She describes a severe pain that was so bad it made her throw up once. She denies any fevers or chills. She was in her normal state of health yesterday. No chest pain or trouble breathing.  She notes that she is going through menopause presently. She has not had a bleeding.   Past Medical History  Diagnosis Date  . Persistent cough   . GERD (gastroesophageal reflux disease)   . Chronic sinusitis   . Asthma     Patient Active Problem List   Diagnosis Date Noted  . Allergic rhinitis 05/01/2015  . Absolute anemia 03/06/2015  . At risk for osteoporosis 03/06/2015  . Long term current use of systemic steroids 03/06/2015  . Acid reflux 03/06/2015  . External hemorrhoid 03/06/2015  . Irregular bleeding 03/06/2015  . Plantar fasciitis 03/06/2015  . Lung nodule 10/14/2013  . Chronic cough 09/12/2013  . Asthma, cough variant 06/30/2009  . Fatty tumor 12/08/2008  . Adiposity 12/06/2008  . Anxiety 11/17/2008  . Adaptive colitis 11/17/2008  . LBP (low back pain) 11/17/2008    Past Surgical History  Procedure Laterality Date  . No past surgeries    . Spot removed off back in 2011  2011    Current Outpatient Rx  Name  Route  Sig  Dispense  Refill  . cetirizine (ZYRTEC) 10 MG tablet   Oral   Take 10 mg by mouth daily.         Marland Kitchen esomeprazole (NEXIUM) 40 MG capsule   Oral   Take 1 capsule (40 mg total) by mouth daily at  12 noon.   30 capsule   5   . Fluticasone Furoate-Vilanterol 200-25 MCG/INH AEPB   Inhalation   Inhale 1 puff into the lungs daily.         . mometasone (NASONEX) 50 MCG/ACT nasal spray   Nasal   Place 2 sprays into the nose daily.   51 g   5   . montelukast (SINGULAIR) 10 MG tablet   Oral   Take 1 tablet (10 mg total) by mouth at bedtime.   90 tablet   3   . cephALEXin (KEFLEX) 500 MG capsule   Oral   Take 1 capsule (500 mg total) by mouth 2 (two) times daily.   20 capsule   0   . HYDROcodone-acetaminophen (NORCO/VICODIN) 5-325 MG per tablet   Oral   Take 1 tablet by mouth every 6 (six) hours as needed for moderate pain.   15 tablet   0   . ondansetron (ZOFRAN ODT) 4 MG disintegrating tablet   Oral   Take 1 tablet (4 mg total) by mouth every 6 (six) hours as needed for nausea or vomiting.   20 tablet   0   . tamsulosin (FLOMAX) 0.4 MG CAPS capsule   Oral   Take 1 capsule (0.4 mg total) by mouth daily.   7 capsule   0  Allergies Review of patient's allergies indicates no known allergies.  Family History  Problem Relation Age of Onset  . Allergies      siblings  . Hypertension Mother   . Hypertension Father   . Kidney disease Brother   . Hypertension Maternal Grandmother   . Cancer Paternal Grandfather     Social History History  Substance Use Topics  . Smoking status: Never Smoker   . Smokeless tobacco: Not on file  . Alcohol Use: No    Review of Systems Constitutional: No fever/chills Eyes: No visual changes. ENT: No sore throat. Cardiovascular: Denies chest pain. Respiratory: Denies shortness of breath. Gastrointestinal: See history of present illness No diarrhea.  No constipation. Genitourinary: Negative for dysuria. Musculoskeletal: Negative for back pain. Skin: Negative for rash. Neurological: Negative for headaches, focal weakness or numbness.  10-point ROS otherwise  negative.  ____________________________________________   PHYSICAL EXAM:  VITAL SIGNS: ED Triage Vitals  Enc Vitals Group     BP 05/09/15 0806 179/96 mmHg     Pulse Rate 05/09/15 0806 77     Resp 05/09/15 0806 18     Temp 05/09/15 0806 97.7 F (36.5 C)     Temp Source 05/09/15 0806 Oral     SpO2 05/09/15 0806 97 %     Weight 05/09/15 0806 190 lb (86.183 kg)     Height 05/09/15 0806 5\' 10"  (1.778 m)     Head Cir --      Peak Flow --      Pain Score 05/09/15 0807 10     Pain Loc --      Pain Edu? --      Excl. in [place]? --     Constitutional: Alert and oriented. Well appearing and very amicable, but does appear to be in pain was sharp stabbing pain in the right back. Eyes: Conjunctivae are normal. PERRL. EOMI. Head: Atraumatic. Nose: No congestion/rhinnorhea. Mouth/Throat: Mucous membranes are moist.  Oropharynx non-erythematous. Neck: No stridor.   Cardiovascular: Normal rate, regular rhythm. Grossly normal heart sounds.  Good peripheral circulation. Respiratory: Normal respiratory effort.  No retractions. Lungs CTAB. Gastrointestinal: Patient has mild tenderness in the right flank and right mid abdomen. No distention. No abdominal bruits. Severe right CVA tenderness. Musculoskeletal: No lower extremity tenderness nor edema.  No joint effusions. Neurologic:  Normal speech and language. No gross focal neurologic deficits are appreciated.  Skin:  Skin is warm, dry and intact. No rash noted. Psychiatric: Mood and affect are normal. Speech and behavior are normal.  ____________________________________________   LABS (all labs ordered are listed, but only abnormal results are displayed)  Labs Reviewed  URINALYSIS COMPLETEWITH MICROSCOPIC ([facility] ONLY) - Abnormal; Notable for the following:    Color, Urine YELLOW (*)    APPearance HAZY (*)    Hgb urine dipstick 3+ (*)    Leukocytes, UA TRACE (*)    Bacteria, UA RARE (*)    Squamous Epithelial / LPF 6-30 (*)    All other  components within normal limits  CBC - Abnormal; Notable for the following:    Hemoglobin 11.8 (*)    MCV 76.8 (*)    MCH 24.6 (*)    RDW 17.7 (*)    All other components within normal limits  COMPREHENSIVE METABOLIC PANEL - Abnormal; Notable for the following:    Calcium 8.5 (*)    All other components within normal limits  LIPASE, BLOOD - Abnormal; Notable for the following:    Lipase 17 (*)  All other components within normal limits  URINE CULTURE   ____________________________________________  EKG   ____________________________________________  RADIOLOGY  CLINICAL DATA: Right flank pain, hematuria starting 4:30 a.m.  EXAM: CT ABDOMEN AND PELVIS WITHOUT CONTRAST  TECHNIQUE: Multidetector CT imaging of the abdomen and pelvis was performed following the standard protocol without IV contrast.  COMPARISON: None.  FINDINGS: Lung bases shows patchy atelectasis or infiltrate in lingula. Sagittal images of the spine are unremarkable.  Unenhanced liver shows no biliary ductal dilatation. Pancreas, spleen and adrenal glands are unremarkable. There is mild right hydronephrosis. Axial image 39 there is 6.8 mm calcified obstructive calculus in right UPJ. Bilateral distal ureter is unremarkable. No calcified calculi are noted within under distended urinary bladder.  Retroflexed uterus. No adnexal mass.  Moderate stool noted in right colon. No pericecal inflammation. Normal appendix.  No small bowel obstruction. No ascites or free air. No adenopathy.  IMPRESSION: 1. There is mild right hydronephrosis. There is 6.8 mm calcified obstructive calculus in right UPJ. 2. Normal appendix. No pericecal inflammation. 3. Moderate stool in right colon. 4. No small bowel obstruction. 5. Retroflexed uterus. ____________________________________________   PROCEDURES  Procedure(s) performed: None  Critical Care performed:  No  ____________________________________________   INITIAL IMPRESSION / ASSESSMENT AND PLAN / ED COURSE  Pertinent labs & imaging results that were available during my care of the patient were reviewed by me and considered in my medical decision making (see chart for details).  Sudden severe right lower abdominal and back pain. Sister he is most suspicious for possible kidney stone, but other considerations would include appendicitis, perforation, torsion though he shouldn't is in menopause making this less likely, cholecystitis/cholelithiasis, choledocho or other acute intra-abdominal process.  ____________________________________________ ----------------------------------------- 9:36 AM on 05/09/2015 -----------------------------------------  Patient reports pain is much improved. Appears comfortable. Await urinalysis, should there be significant hematuria then I recommend CT for stone if no blood then likely CT with contrast.   CT shows moderate sized R sided stone with hydro, explains symptoms.  Discussed with Dr. Noah Delaine of urology who has agreed to follow up the patient this week in clinic. He reviewed CT scan imaging with me as well as labs and urinalysis.  Discussed and reviewed return precautions and treatment plan as well as follow-up this week with urology with the patient who is agreeable.  I will prescribe the patient a narcotic pain medicine due to their condition which I anticipate will cause at least moderate pain short term. I discussed with the patient safe use of narcotic pain medicines, and that they are not to drive, work in dangerous areas, or ever take more than prescribed (no more than 1 pill every 6 hours). We discussed that this is the type of medication that "Alfonse Spruce" may have overdosed on and the risks of this type of medicine. Patient is very agreeable to only use as prescribed and to never use more than prescribed.   FINAL CLINICAL IMPRESSION(S) / ED  DIAGNOSES  Final diagnoses:  Acute right flank pain  Right kidney stone      Delman Kitten, MD 05/09/15 1318

## 2015-05-09 NOTE — ED Notes (Signed)
Patient transported to CT 

## 2015-05-09 NOTE — ED Notes (Signed)
Early am developed right flank pain with some n/v

## 2015-05-11 ENCOUNTER — Other Ambulatory Visit: Payer: Self-pay | Admitting: Urology

## 2015-05-11 LAB — URINE CULTURE

## 2015-05-12 ENCOUNTER — Encounter (HOSPITAL_COMMUNITY): Payer: Self-pay | Admitting: *Deleted

## 2015-05-12 NOTE — H&P (Signed)
  Chief Complaint Chief Complaints   1. Flank Pain  History of Present Illness  Angela Reyes is a 54yo seen in consultation for ureteral calculi. She is referred by Dr. 2 days ago she had right flank pain.   Past Medical History Problems   1. History of Anxiety (F41.9)  2. History of asthma (Z87.09)  3. History of esophageal reflux (Z87.19)  4. History of gestational diabetes mellitus (GDM) (Z86.32)  5. History of sleep apnea (Z87.09)  Surgical History Problems   1. History of Dental Surgery  2. History of Tonsillectomy  Current Meds  1. Breo Ellipta 100-25 MCG/INH Inhalation Aerosol Powder Breath Activated;  Therapy: (Recorded:21Jul2016) to Recorded  2. Cephalexin 500 MG Oral Capsule;  Therapy: (Recorded:21Jul2016) to Recorded  3. Fluticasone Propionate 50 MCG/ACT Nasal Suspension;  Therapy: (Recorded:21Jul2016) to Recorded  4. Hydrocodone-Acetaminophen 5-325 MG Oral Tablet;  Therapy: (Recorded:21Jul2016) to Recorded  5. NexIUM 40 MG Oral Capsule Delayed Release;  Therapy: (Recorded:21Jul2016) to Recorded  6. Ondansetron HCl - 4 MG Oral Tablet;  Therapy: (Recorded:21Jul2016) to Recorded  7. ProAir HFA 108 (90 Base) MCG/ACT Inhalation Aerosol Solution;  Therapy: (Recorded:21Jul2016) to Recorded  8. Singulair 10 MG Oral Tablet;  Therapy: (Recorded:21Jul2016) to Recorded  9. Tamsulosin HCl - 0.4 MG Oral Capsule;  Therapy: (Recorded:21Jul2016) to Recorded  10. Zyrtec TABS;   Therapy: (Recorded:21Jul2016) to Recorded  Allergies Medication   1. No Known Drug Allergies  Family History Problems   1. Family history of hypertension (Z82.49) : Mother, Father  2. Family history of kidney stones (Z84.1) : Father, Sister  Social History Problems    Denied: History of Alcohol use   Caffeine use (F15.90)   Divorced   Never a smoker   Number of children   Occupation  Vitals Vital Signs [Data Includes: Last 1 Day]  Recorded: 21Jul2016 11:24AM  Height: 5 ft 10  in Weight: 190 lb  BMI Calculated: 27.26 BSA Calculated: 2.04 Blood Pressure: 132 / 86 Temperature: 97.6 F Heart Rate: 73  Results/Data Urine [Data Includes: Last 1 Day]   [id number]  COLOR YELLOW   APPEARANCE CLEAR   SPECIFIC GRAVITY 1.030   pH 5.5   GLUCOSE NEG mg/dL  BILIRUBIN NEG   KETONE TRACE mg/dL  BLOOD LARGE   PROTEIN 30 mg/dL  UROBILINOGEN 0.2 mg/dL  NITRITE NEG   LEUKOCYTE ESTERASE NEG   SQUAMOUS EPITHELIAL/HPF MODERATE   WBC NONE SEEN WBC/hpf  RBC 11-20 RBC/hpf  BACTERIA NONE SEEN   CRYSTALS NONE SEEN   CASTS NONE SEEN    Assessment Assessed   1. Ureteral stone (N20.1)  Plan Health Maintenance   1. UA With REFLEX; [Do Not Release]; Status:Resulted - Requires Verification;   Done:  [id number] 10:47AM Ureteral stone   2. Start: Tamsulosin HCl - 0.4 MG Oral Capsule (Flomax); TAKE 1 CAPSULE Daily  3. KUB; Status:Resulted - Requires Verification;   Done: [id number] 11:49AM   1. schedule for R ESWL

## 2015-05-15 ENCOUNTER — Encounter (HOSPITAL_COMMUNITY): Payer: Self-pay | Admitting: General Practice

## 2015-05-15 ENCOUNTER — Emergency Department
Admission: EM | Admit: 2015-05-15 | Discharge: 2015-05-16 | Disposition: A | Discharge status: Home / Self Care | Payer: BLUE CROSS/BLUE SHIELD | Attending: Emergency Medicine | Admitting: Emergency Medicine

## 2015-05-15 ENCOUNTER — Encounter (HOSPITAL_COMMUNITY)
Admission: RE | Disposition: A | Discharge status: Home / Self Care | Payer: Self-pay | Source: Ambulatory Visit | Attending: Urology

## 2015-05-15 ENCOUNTER — Ambulatory Visit (HOSPITAL_COMMUNITY): Payer: BLUE CROSS/BLUE SHIELD

## 2015-05-15 ENCOUNTER — Ambulatory Visit (HOSPITAL_COMMUNITY)
Admission: RE | Admit: 2015-05-15 | Discharge: 2015-05-15 | Disposition: A | Discharge status: Home / Self Care | Payer: BLUE CROSS/BLUE SHIELD | Source: Ambulatory Visit | Attending: Urology | Admitting: Urology

## 2015-05-15 ENCOUNTER — Other Ambulatory Visit (HOSPITAL_COMMUNITY): Payer: Self-pay | Admitting: Urology

## 2015-05-15 ENCOUNTER — Encounter: Payer: Self-pay | Admitting: Emergency Medicine

## 2015-05-15 DIAGNOSIS — J45909 Unspecified asthma, uncomplicated: Secondary | ICD-10-CM | POA: Diagnosis not present

## 2015-05-15 DIAGNOSIS — G4730 Sleep apnea, unspecified: Secondary | ICD-10-CM | POA: Insufficient documentation

## 2015-05-15 DIAGNOSIS — Z792 Long term (current) use of antibiotics: Secondary | ICD-10-CM | POA: Diagnosis not present

## 2015-05-15 DIAGNOSIS — R109 Unspecified abdominal pain: Secondary | ICD-10-CM

## 2015-05-15 DIAGNOSIS — Z79891 Long term (current) use of opiate analgesic: Secondary | ICD-10-CM | POA: Insufficient documentation

## 2015-05-15 DIAGNOSIS — N200 Calculus of kidney: Secondary | ICD-10-CM | POA: Insufficient documentation

## 2015-05-15 DIAGNOSIS — K219 Gastro-esophageal reflux disease without esophagitis: Secondary | ICD-10-CM | POA: Diagnosis not present

## 2015-05-15 DIAGNOSIS — Z79899 Other long term (current) drug therapy: Secondary | ICD-10-CM | POA: Insufficient documentation

## 2015-05-15 DIAGNOSIS — N201 Calculus of ureter: Secondary | ICD-10-CM

## 2015-05-15 DIAGNOSIS — Z841 Family history of disorders of kidney and ureter: Secondary | ICD-10-CM | POA: Diagnosis not present

## 2015-05-15 DIAGNOSIS — Z7951 Long term (current) use of inhaled steroids: Secondary | ICD-10-CM | POA: Insufficient documentation

## 2015-05-15 HISTORY — PX: EXTRACORPOREAL SHOCK WAVE LITHOTRIPSY: SHX1557

## 2015-05-15 LAB — COMPREHENSIVE METABOLIC PANEL
ALK PHOS: 86 U/L (ref 38–126)
ALT: 24 U/L (ref 14–54)
ANION GAP: 9 (ref 5–15)
AST: 24 U/L (ref 15–41)
Albumin: 3.9 g/dL (ref 3.5–5.0)
BUN: 9 mg/dL (ref 6–20)
CO2: 23 mmol/L (ref 22–32)
Calcium: 8.7 mg/dL — ABNORMAL LOW (ref 8.9–10.3)
Chloride: 105 mmol/L (ref 101–111)
Creatinine, Ser: 0.89 mg/dL (ref 0.44–1.00)
GLUCOSE: 121 mg/dL — AB (ref 65–99)
Potassium: 4.2 mmol/L (ref 3.5–5.1)
Sodium: 137 mmol/L (ref 135–145)
TOTAL PROTEIN: 7.5 g/dL (ref 6.5–8.1)
Total Bilirubin: 0.3 mg/dL (ref 0.3–1.2)

## 2015-05-15 LAB — URINALYSIS COMPLETE WITH MICROSCOPIC (ARMC ONLY)
BILIRUBIN URINE: NEGATIVE
Glucose, UA: NEGATIVE mg/dL
Nitrite: NEGATIVE
Protein, ur: 100 mg/dL — AB
SPECIFIC GRAVITY, URINE: 1.021 (ref 1.005–1.030)
pH: 6 (ref 5.0–8.0)

## 2015-05-15 LAB — CBC WITH DIFFERENTIAL/PLATELET
Basophils Absolute: 0.1 10*3/uL (ref 0–0.1)
Basophils Relative: 1 %
EOS ABS: 0.3 10*3/uL (ref 0–0.7)
EOS PCT: 2 %
HCT: 38.8 % (ref 35.0–47.0)
Hemoglobin: 12.1 g/dL (ref 12.0–16.0)
LYMPHS PCT: 8 %
Lymphs Abs: 1.1 10*3/uL (ref 1.0–3.6)
MCH: 24.1 pg — ABNORMAL LOW (ref 26.0–34.0)
MCHC: 31.2 g/dL — AB (ref 32.0–36.0)
MCV: 77.3 fL — ABNORMAL LOW (ref 80.0–100.0)
MONOS PCT: 7 %
Monocytes Absolute: 1 10*3/uL — ABNORMAL HIGH (ref 0.2–0.9)
NEUTROS PCT: 82 %
Neutro Abs: 12 10*3/uL — ABNORMAL HIGH (ref 1.4–6.5)
Platelets: 329 10*3/uL (ref 150–440)
RBC: 5.02 MIL/uL (ref 3.80–5.20)
RDW: 17.6 % — ABNORMAL HIGH (ref 11.5–14.5)
WBC: 14.5 10*3/uL — ABNORMAL HIGH (ref 3.6–11.0)

## 2015-05-15 LAB — LIPASE, BLOOD: LIPASE: 13 U/L — AB (ref 22–51)

## 2015-05-15 LAB — PREGNANCY, URINE: PREG TEST UR: NEGATIVE

## 2015-05-15 SURGERY — LITHOTRIPSY, ESWL
Anesthesia: LOCAL | Laterality: Right

## 2015-05-15 MED ORDER — CIPROFLOXACIN HCL 500 MG PO TABS
500.0000 mg | ORAL_TABLET | ORAL | Status: AC
  Administered 2015-05-15: 500 mg via ORAL
  Filled 2015-05-15: qty 1

## 2015-05-15 MED ORDER — OXYCODONE HCL 5 MG PO TABS
5.0000 mg | ORAL_TABLET | ORAL | Status: DC | PRN
  Administered 2015-05-15: 5 mg via ORAL
  Filled 2015-05-15: qty 1

## 2015-05-15 MED ORDER — SODIUM CHLORIDE 0.9 % IV SOLN: 250.0000 mL | INTRAVENOUS | Status: DC | PRN

## 2015-05-15 MED ORDER — DIPHENHYDRAMINE HCL 25 MG PO CAPS
25.0000 mg | ORAL_CAPSULE | ORAL | Status: AC
  Administered 2015-05-15: 25 mg via ORAL
  Filled 2015-05-15: qty 1

## 2015-05-15 MED ORDER — MORPHINE SULFATE 4 MG/ML IJ SOLN
4.0000 mg | Freq: Once | INTRAMUSCULAR | Status: AC
  Administered 2015-05-15: 4 mg via INTRAVENOUS

## 2015-05-15 MED ORDER — MORPHINE SULFATE 4 MG/ML IJ SOLN
4.0000 mg | Freq: Once | INTRAMUSCULAR | Status: AC
Start: 2015-05-15 — End: 2015-05-15
  Administered 2015-05-15: 4 mg via INTRAVENOUS

## 2015-05-15 MED ORDER — TAMSULOSIN HCL 0.4 MG PO CAPS: 0.4000 mg | ORAL_CAPSULE | Freq: Every day | ORAL | Status: DC

## 2015-05-15 MED ORDER — MORPHINE SULFATE 4 MG/ML IJ SOLN
INTRAMUSCULAR | Status: AC
  Administered 2015-05-15: 4 mg via INTRAVENOUS
  Filled 2015-05-15: qty 1

## 2015-05-15 MED ORDER — FENTANYL CITRATE (PF) 100 MCG/2ML IJ SOLN: 25.0000 ug | INTRAMUSCULAR | Status: DC | PRN

## 2015-05-15 MED ORDER — SODIUM CHLORIDE 0.9 % IJ SOLN: 3.0000 mL | INTRAMUSCULAR | Status: DC | PRN

## 2015-05-15 MED ORDER — SODIUM CHLORIDE 0.9 % IV SOLN
INTRAVENOUS | Status: DC
  Administered 2015-05-15: 09:00:00 via INTRAVENOUS

## 2015-05-15 MED ORDER — SODIUM CHLORIDE 0.9 % IV BOLUS (SEPSIS)
1000.0000 mL | Freq: Once | INTRAVENOUS | Status: AC
  Administered 2015-05-15: 1000 mL via INTRAVENOUS

## 2015-05-15 MED ORDER — HYDROCODONE-ACETAMINOPHEN 5-325 MG PO TABS
1.0000 | ORAL_TABLET | Freq: Four times a day (QID) | ORAL | Status: DC | PRN
Start: 2015-05-15 — End: 2015-10-13

## 2015-05-15 MED ORDER — SODIUM CHLORIDE 0.9 % IJ SOLN: 3.0000 mL | Freq: Two times a day (BID) | INTRAMUSCULAR | Status: DC

## 2015-05-15 MED ORDER — ONDANSETRON HCL 4 MG/2ML IJ SOLN
4.0000 mg | Freq: Once | INTRAMUSCULAR | Status: AC
  Administered 2015-05-15: 4 mg via INTRAVENOUS

## 2015-05-15 MED ORDER — ACETAMINOPHEN 325 MG PO TABS: 650.0000 mg | ORAL_TABLET | ORAL | Status: DC | PRN

## 2015-05-15 MED ORDER — ACETAMINOPHEN 650 MG RE SUPP
650.0000 mg | RECTAL | Status: DC | PRN
  Filled 2015-05-15: qty 1

## 2015-05-15 MED ORDER — DIAZEPAM 5 MG PO TABS
10.0000 mg | ORAL_TABLET | ORAL | Status: AC
  Administered 2015-05-15: 10 mg via ORAL
  Filled 2015-05-15: qty 2

## 2015-05-15 MED ORDER — ONDANSETRON HCL 4 MG/2ML IJ SOLN
INTRAMUSCULAR | Status: AC
  Administered 2015-05-15: 4 mg via INTRAVENOUS
  Filled 2015-05-15: qty 2

## 2015-05-15 NOTE — Discharge Instructions (Signed)

## 2015-05-15 NOTE — ED Notes (Signed)
Pt to triage via w/c, appears uncomfortable, grimacing; Pt had at lithotripsy at [facility]; now with right flank pain radiating into lower abdomen unrelieved by hydrocodone accomp by nausea

## 2015-05-15 NOTE — Interval H&P Note (Signed)
History and Physical Interval Note: Stone without change.  Needs pain med and tamsulosin  05/15/2015 9:50 AM  Angela Reyes  has presented today for surgery, with the diagnosis of RIGHT URETERAL STONE  The various methods of treatment have been discussed with the patient and family. After consideration of risks, benefits and other options for treatment, the patient has consented to  Procedure(s): RIGHT EXTRACORPOREAL SHOCK WAVE LITHOTRIPSY (ESWL) (Right) as a surgical intervention .  The patient's history has been reviewed, patient examined, no change in status, stable for surgery.  I have reviewed the patient's chart and labs.  Questions were answered to the patient's satisfaction.     Keshon Markovitz J

## 2015-05-16 ENCOUNTER — Emergency Department: Payer: BLUE CROSS/BLUE SHIELD

## 2015-05-16 MED ORDER — MORPHINE SULFATE 4 MG/ML IJ SOLN
4.0000 mg | Freq: Once | INTRAMUSCULAR | Status: AC
  Administered 2015-05-16: 4 mg via INTRAVENOUS
  Filled 2015-05-16: qty 1

## 2015-05-16 MED ORDER — ONDANSETRON HCL 4 MG/2ML IJ SOLN
4.0000 mg | Freq: Once | INTRAMUSCULAR | Status: AC
  Administered 2015-05-16: 4 mg via INTRAVENOUS
  Filled 2015-05-16: qty 2

## 2015-05-16 MED ORDER — OXYCODONE-ACETAMINOPHEN 5-325 MG PO TABS
1.0000 | ORAL_TABLET | Freq: Once | ORAL | Status: AC
  Administered 2015-05-16: 1 via ORAL
  Filled 2015-05-16: qty 1

## 2015-05-16 MED ORDER — OXYCODONE-ACETAMINOPHEN 5-325 MG PO TABS: 1.0000 | ORAL_TABLET | Freq: Four times a day (QID) | ORAL | Status: DC | PRN

## 2015-05-16 NOTE — Discharge Instructions (Signed)
Flank Pain Flank pain refers to pain that is located on the side of the body between the upper abdomen and the back. The pain may occur over a short period of time (acute) or may be long-term or reoccurring (chronic). It may be mild or severe. Flank pain can be caused by many things. CAUSES  Some of the more common causes of flank pain include:  Muscle strains.   Muscle spasms.   A disease of your spine (vertebral disk disease).   A lung infection (pneumonia).   Fluid around your lungs (pulmonary edema).   A kidney infection.   Kidney stones.   A very painful skin rash caused by the chickenpox virus (shingles).   Gallbladder disease.  [place] care will depend on the cause of your pain. In general,  Rest as directed by your caregiver.  Drink enough fluids to keep your urine clear or pale yellow.  Only take over-the-counter or prescription medicines as directed by your caregiver. Some medicines may help relieve the pain.  Tell your caregiver about any changes in your pain.  Follow up with your caregiver as directed. SEEK IMMEDIATE MEDICAL CARE IF:   Your pain is not controlled with medicine.   You have new or worsening symptoms.  Your pain increases.   You have abdominal pain.   You have shortness of breath.   You have persistent nausea or vomiting.   You have swelling in your abdomen.   You feel faint or pass out.   You have blood in your urine.  You have a fever or persistent symptoms for more than 2-3 days.  You have a fever and your symptoms suddenly get worse. MAKE SURE YOU:   Understand these instructions.  Will watch your condition.  Will get help right away if you are not doing well or get worse. Document Released: 11/28/2005 Document Revised: 07/01/2012 Document Reviewed: 05/21/2012 [facility] Patient Information 2015 [place], [place]. This information is not intended to replace advice given to you by your  health care provider. Make sure you discuss any questions you have with your health care provider.  Kidney Stones Kidney stones (urolithiasis) are deposits that form inside your kidneys. The intense pain is caused by the stone moving through the urinary tract. When the stone moves, the ureter goes into spasm around the stone. The stone is usually passed in the urine.  CAUSES   A disorder that makes certain neck glands produce too much parathyroid hormone (primary hyperparathyroidism).  A buildup of uric acid crystals, similar to gout in your joints.  Narrowing (stricture) of the ureter.  A kidney obstruction present at birth (congenital obstruction).  Previous surgery on the kidney or ureters.  Numerous kidney infections. SYMPTOMS   Feeling sick to your stomach (nauseous).  Throwing up (vomiting).  Blood in the urine (hematuria).  Pain that usually spreads (radiates) to the groin.  Frequency or urgency of urination. DIAGNOSIS   Taking a history and physical exam.  Blood or urine tests.  CT scan.  Occasionally, an examination of the inside of the urinary bladder (cystoscopy) is performed. TREATMENT   Observation.  Increasing your fluid intake.  Extracorporeal shock wave lithotripsy--This is a noninvasive procedure that uses shock waves to break up kidney stones.  Surgery may be needed if you have severe pain or persistent obstruction. There are various surgical procedures. Most of the procedures are performed with the use of small instruments. Only small incisions are needed to accommodate these instruments,  so recovery time is minimized. The size, location, and chemical composition are all important variables that will determine the proper choice of action for you. Talk to your health care provider to better understand your situation so that you will minimize the risk of injury to yourself and your kidney.  HOME CARE INSTRUCTIONS   Drink enough water and fluids to  keep your urine clear or pale yellow. This will help you to pass the stone or stone fragments.  Strain all urine through the provided strainer. Keep all particulate matter and stones for your health care provider to see. The stone causing the pain may be as small as a grain of salt. It is very important to use the strainer each and every time you pass your urine. The collection of your stone will allow your health care provider to analyze it and verify that a stone has actually passed. The stone analysis will often identify what you can do to reduce the incidence of recurrences.  Only take over-the-counter or prescription medicines for pain, discomfort, or fever as directed by your health care provider.  Make a follow-up appointment with your health care provider as directed.  Get follow-up X-rays if required. The absence of pain does not always mean that the stone has passed. It may have only stopped moving. If the urine remains completely obstructed, it can cause loss of kidney function or even complete destruction of the kidney. It is your responsibility to make sure X-rays and follow-ups are completed. Ultrasounds of the kidney can show blockages and the status of the kidney. Ultrasounds are not associated with any radiation and can be performed easily in a matter of minutes. SEEK MEDICAL CARE IF:  You experience pain that is progressive and unresponsive to any pain medicine you have been prescribed. SEEK IMMEDIATE MEDICAL CARE IF:   Pain cannot be controlled with the prescribed medicine.  You have a fever or shaking chills.  The severity or intensity of pain increases over 18 hours and is not relieved by pain medicine.  You develop a new onset of abdominal pain.  You feel faint or pass out.  You are unable to urinate. MAKE SURE YOU:   Understand these instructions.  Will watch your condition.  Will get help right away if you are not doing well or get worse. Document Released:  10/07/2005 Document Revised: 06/09/2013 Document Reviewed: 03/10/2013 [facility] Patient Information 2015 [place], [place]. This information is not intended to replace advice given to you by your health care provider. Make sure you discuss any questions you have with your health care provider.

## 2015-05-16 NOTE — ED Notes (Signed)
Patient transported to Ultrasound 

## 2015-05-16 NOTE — ED Provider Notes (Signed)
[facility] Emergency Department Provider Note  ____________________________________________  Time seen: Approximately 2302 PM  I have reviewed the triage vital signs and the nursing notes.   HISTORY  Chief Complaint Flank Pain   HPI Angela Reyes is a 54 y.o. female who had a lithotripsy done today. The patient reports that she was diagnosed with a 7 millimeter kidney stone on the right. She reports that the lithotripsy was done today and she was given hydrocodone for pain. She reports that her pain has increased tremendously since the lithotripsy. The pain started to worsen at approximately 1700 and increased even more around 2030. She reports that it doesn't seem to be getting any better. She has not yet passed any stones but has continued to have blood in the urine. The patient reports that the hydrocodone has not been helping. She took one at 1:30 and another at 7:30. She's had some nausea with no vomiting. It is right-sided pain that is shooting down. She reports that she also took a medication for nausea. Her pain is a10 out of 10 in intensity.   Past Medical History  Diagnosis Date  . Persistent cough   . GERD (gastroesophageal reflux disease)   . Chronic sinusitis   . Asthma   . Renal stone     Patient Active Problem List   Diagnosis Date Noted  . Allergic rhinitis 05/01/2015  . Absolute anemia 03/06/2015  . At risk for osteoporosis 03/06/2015  . Long term current use of systemic steroids 03/06/2015  . Acid reflux 03/06/2015  . External hemorrhoid 03/06/2015  . Irregular bleeding 03/06/2015  . Plantar fasciitis 03/06/2015  . Lung nodule 10/14/2013  . Chronic cough 09/12/2013  . Asthma, cough variant 06/30/2009  . Fatty tumor 12/08/2008  . Adiposity 12/06/2008  . Anxiety 11/17/2008  . Adaptive colitis 11/17/2008  . LBP (low back pain) 11/17/2008    Past Surgical History  Procedure Laterality Date  . No past surgeries    . Spot  removed off back in 2011  2011  . Lithotripsy  05/15/2015    Current Outpatient Rx  Name  Route  Sig  Dispense  Refill  . cephALEXin (KEFLEX) 500 MG capsule   Oral   Take 1 capsule (500 mg total) by mouth 2 (two) times daily.   20 capsule   0   . cetirizine (ZYRTEC) 10 MG tablet   Oral   Take 10 mg by mouth daily.         Marland Kitchen esomeprazole (NEXIUM) 40 MG capsule   Oral   Take 1 capsule (40 mg total) by mouth daily at 12 noon.   30 capsule   5   . Fluticasone Furoate-Vilanterol 200-25 MCG/INH AEPB   Inhalation   Inhale 1 puff into the lungs daily.         Marland Kitchen HYDROcodone-acetaminophen (NORCO/VICODIN) 5-325 MG per tablet   Oral   Take 1 tablet by mouth every 6 (six) hours as needed for moderate pain.   30 tablet   0   . mometasone (NASONEX) 50 MCG/ACT nasal spray   Nasal   Place 2 sprays into the nose daily.   51 g   5   . montelukast (SINGULAIR) 10 MG tablet   Oral   Take 1 tablet (10 mg total) by mouth at bedtime.   90 tablet   3   . ondansetron (ZOFRAN ODT) 4 MG disintegrating tablet   Oral   Take 1 tablet (4 mg total)  by mouth every 6 (six) hours as needed for nausea or vomiting.   20 tablet   0   . tamsulosin (FLOMAX) 0.4 MG CAPS capsule   Oral   Take 1 capsule (0.4 mg total) by mouth daily.   30 capsule   0   . oxyCODONE-acetaminophen (ROXICET) 5-325 MG per tablet   Oral   Take 1 tablet by mouth every 6 (six) hours as needed.   12 tablet   0     Allergies Review of patient's allergies indicates no known allergies.  Family History  Problem Relation Age of Onset  . Allergies      siblings  . Hypertension Mother   . Hypertension Father   . Kidney disease Brother   . Hypertension Maternal Grandmother   . Cancer Paternal Grandfather     Social History History  Substance Use Topics  . Smoking status: Never Smoker   . Smokeless tobacco: Not on file  . Alcohol Use: No    Review of Systems Constitutional: No fever/chills Eyes: No  visual changes. ENT: No sore throat. Cardiovascular: Denies chest pain. Respiratory: Denies shortness of breath. Gastrointestinal: Right flank and side pain, nausea with no vomiting Genitourinary: Hematuria Musculoskeletal: Right flank pain Skin: Negative for rash. Neurological: Negative for headaches, focal weakness or numbness.  10-point ROS otherwise negative.  ____________________________________________   PHYSICAL EXAM:  VITAL SIGNS: ED Triage Vitals  Enc Vitals Group     BP 05/15/15 2211 147/74 mmHg     Pulse Rate 05/15/15 2211 63     Resp 05/15/15 2211 18     Temp 05/15/15 2211 97.9 F (36.6 C)     Temp Source 05/15/15 2211 Oral     SpO2 05/15/15 2211 99 %     Weight 05/15/15 2211 188 lb (85.276 kg)     Height 05/15/15 2211 5\' 10"  (1.778 m)     Head Cir --      Peak Flow --      Pain Score 05/15/15 2212 10     Pain Loc --      Pain Edu? --      Excl. in [place]? --     Constitutional: Alert and oriented. Well appearing and in moderate distress. Eyes: Conjunctivae are normal. PERRL. EOMI. Head: Atraumatic. Nose: No congestion/rhinnorhea. Mouth/Throat: Mucous membranes are moist.  Oropharynx non-erythematous. Cardiovascular: Normal rate, regular rhythm. Grossly normal heart sounds.  Good peripheral circulation. Respiratory: Normal respiratory effort.  No retractions. Lungs CTAB. Gastrointestinal: Soft right sided tenderness to palpation. No distention. Right CVA tenderness to palpation Genitourinary: Deferred Musculoskeletal: No lower extremity tenderness nor edema.   Neurologic:  Normal speech and language. No gross focal neurologic deficits are appreciated.  Skin:  Skin is warm, dry and intact.  Psychiatric: Mood and affect are normal.  ____________________________________________   LABS (all labs ordered are listed, but only abnormal results are displayed)  Labs Reviewed  CBC WITH DIFFERENTIAL/PLATELET - Abnormal; Notable for the following:    WBC 14.5 (*)     MCV 77.3 (*)    MCH 24.1 (*)    MCHC 31.2 (*)    RDW 17.6 (*)    Neutro Abs 12.0 (*)    Monocytes Absolute 1.0 (*)    All other components within normal limits  COMPREHENSIVE METABOLIC PANEL - Abnormal; Notable for the following:    Glucose, Bld 121 (*)    Calcium 8.7 (*)    All other components within normal limits  LIPASE, BLOOD - Abnormal;  Notable for the following:    Lipase 13 (*)    All other components within normal limits  URINALYSIS COMPLETEWITH MICROSCOPIC ([facility] ONLY) - Abnormal; Notable for the following:    Color, Urine YELLOW (*)    APPearance CLOUDY (*)    Ketones, ur TRACE (*)    Hgb urine dipstick 3+ (*)    Protein, ur 100 (*)    Leukocytes, UA TRACE (*)    Bacteria, UA FEW (*)    Squamous Epithelial / LPF 0-5 (*)    All other components within normal limits   ____________________________________________  EKG  None ____________________________________________  RADIOLOGY  Renal ultrasound: Moderate right hydronephrosis, lower pole right nephrolithiasis ____________________________________________   PROCEDURES  Procedure(s) performed: None  Critical Care performed: No  ____________________________________________   INITIAL IMPRESSION / ASSESSMENT AND PLAN / ED COURSE  Pertinent labs & imaging results that were available during my care of the patient were reviewed by me and considered in my medical decision making (see chart for details).  This is a 54 year old female who comes in today with right-sided pain after lithotripsy. The patient reports that she has not yet passed any stones but is having increasing pain. I did give the patient 2 doses of morphine a liter of normal saline as well as Zofran. I will reassess the patient once he received the results of the ultrasound.  After the 2 doses of morphine the patient reports her pain was improved but worsened after her ultrasound. I did give her a third dose of morphine as well as an oral dose  of Percocet. The patient received a liter of normal saline. The patient's pain seems to be under better control at this time. I will discharge the patient home and have her follow-up with her urologist who did her procedure. The patient at this time is comfortable and not having more severe pain. I informed her that she does still have to pass her kidney stones which is why she is having continued pain. The patient will be discharged home her and her husband do understand the plan as stated. ____________________________________________   FINAL CLINICAL IMPRESSION(S) / ED DIAGNOSES  Final diagnoses:  Kidney stones  Flank pain      Loney Hering, MD 05/16/15 [phone]

## 2015-05-19 ENCOUNTER — Other Ambulatory Visit: Payer: Self-pay | Admitting: Urology

## 2015-05-19 ENCOUNTER — Encounter (HOSPITAL_COMMUNITY): Payer: Self-pay | Admitting: *Deleted

## 2015-05-19 NOTE — Progress Notes (Signed)
Called requested orders be released in Epic to sign and held for Same day surgery 05-22-15 Thanks

## 2015-05-22 ENCOUNTER — Ambulatory Visit (HOSPITAL_COMMUNITY): Admission: RE | Admit: 2015-05-22 | Payer: BLUE CROSS/BLUE SHIELD | Source: Ambulatory Visit | Admitting: Urology

## 2015-05-22 SURGERY — CYSTOSCOPY, WITH RETROGRADE PYELOGRAM AND URETERAL STENT INSERTION
Anesthesia: General | Laterality: Right

## 2015-06-05 ENCOUNTER — Other Ambulatory Visit: Payer: Self-pay | Admitting: Urology

## 2015-06-05 ENCOUNTER — Encounter (HOSPITAL_BASED_OUTPATIENT_CLINIC_OR_DEPARTMENT_OTHER): Payer: Self-pay | Admitting: *Deleted

## 2015-06-05 NOTE — Progress Notes (Signed)
NPO AFTER MN.  ARRIVE AT 1000.  CURRENT LAB RESULTS IN CHART AND EPIC .  MAY TAKE PAIN/ NAUSEA RX IF NEEDED AM DOS W/ SIPS OF WATER.

## 2015-06-07 ENCOUNTER — Ambulatory Visit (HOSPITAL_BASED_OUTPATIENT_CLINIC_OR_DEPARTMENT_OTHER): Payer: BLUE CROSS/BLUE SHIELD

## 2015-06-07 ENCOUNTER — Encounter (HOSPITAL_BASED_OUTPATIENT_CLINIC_OR_DEPARTMENT_OTHER): Payer: Self-pay

## 2015-06-07 ENCOUNTER — Ambulatory Visit (HOSPITAL_BASED_OUTPATIENT_CLINIC_OR_DEPARTMENT_OTHER)
Admission: RE | Admit: 2015-06-07 | Discharge: 2015-06-07 | Disposition: A | Discharge status: Home / Self Care | Payer: BLUE CROSS/BLUE SHIELD | Source: Ambulatory Visit | Attending: Urology | Admitting: Urology

## 2015-06-07 ENCOUNTER — Encounter (HOSPITAL_BASED_OUTPATIENT_CLINIC_OR_DEPARTMENT_OTHER)
Admission: RE | Disposition: A | Discharge status: Home / Self Care | Payer: Self-pay | Source: Ambulatory Visit | Attending: Urology

## 2015-06-07 DIAGNOSIS — K219 Gastro-esophageal reflux disease without esophagitis: Secondary | ICD-10-CM | POA: Insufficient documentation

## 2015-06-07 DIAGNOSIS — N132 Hydronephrosis with renal and ureteral calculous obstruction: Secondary | ICD-10-CM | POA: Diagnosis not present

## 2015-06-07 DIAGNOSIS — Z79899 Other long term (current) drug therapy: Secondary | ICD-10-CM | POA: Insufficient documentation

## 2015-06-07 DIAGNOSIS — J45909 Unspecified asthma, uncomplicated: Secondary | ICD-10-CM | POA: Diagnosis not present

## 2015-06-07 DIAGNOSIS — G4733 Obstructive sleep apnea (adult) (pediatric): Secondary | ICD-10-CM | POA: Diagnosis not present

## 2015-06-07 DIAGNOSIS — N362 Urethral caruncle: Secondary | ICD-10-CM | POA: Insufficient documentation

## 2015-06-07 DIAGNOSIS — R109 Unspecified abdominal pain: Secondary | ICD-10-CM | POA: Diagnosis present

## 2015-06-07 HISTORY — PX: CYSTOSCOPY W/ URETERAL STENT PLACEMENT: SHX1429

## 2015-06-07 HISTORY — DX: Personal history of gestational diabetes: Z86.32

## 2015-06-07 HISTORY — DX: Solitary pulmonary nodule: R91.1

## 2015-06-07 HISTORY — DX: Chronic rhinitis: J31.0

## 2015-06-07 HISTORY — DX: Personal history of other diseases of the digestive system: Z87.19

## 2015-06-07 HISTORY — DX: Obstructive sleep apnea (adult) (pediatric): G47.33

## 2015-06-07 HISTORY — DX: Chronic sinusitis, unspecified: J32.9

## 2015-06-07 HISTORY — DX: Calculus of ureter: N20.1

## 2015-06-07 HISTORY — PX: HOLMIUM LASER APPLICATION: SHX5852

## 2015-06-07 SURGERY — CYSTOSCOPY, WITH RETROGRADE PYELOGRAM AND URETERAL STENT INSERTION
Anesthesia: General | Site: Ureter | Laterality: Right

## 2015-06-07 MED ORDER — DIAZEPAM 5 MG PO TABS: 5.0000 mg | ORAL_TABLET | Freq: Three times a day (TID) | ORAL | Status: DC | PRN

## 2015-06-07 MED ORDER — FENTANYL CITRATE (PF) 100 MCG/2ML IJ SOLN
INTRAMUSCULAR | Status: DC | PRN
  Administered 2015-06-07: 50 ug via INTRAVENOUS
  Administered 2015-06-07: 100 ug via INTRAVENOUS

## 2015-06-07 MED ORDER — PROMETHAZINE HCL 25 MG/ML IJ SOLN
6.2500 mg | INTRAMUSCULAR | Status: DC | PRN
  Filled 2015-06-07: qty 1

## 2015-06-07 MED ORDER — LACTATED RINGERS IV SOLN
INTRAVENOUS | Status: DC
  Filled 2015-06-07: qty 1000

## 2015-06-07 MED ORDER — MIDAZOLAM HCL 2 MG/2ML IJ SOLN
INTRAMUSCULAR | Status: AC
  Filled 2015-06-07: qty 2

## 2015-06-07 MED ORDER — BELLADONNA ALKALOIDS-OPIUM 16.2-60 MG RE SUPP
RECTAL | Status: AC
  Filled 2015-06-07: qty 1

## 2015-06-07 MED ORDER — TAMSULOSIN HCL 0.4 MG PO CAPS: 0.4000 mg | ORAL_CAPSULE | Freq: Every day | ORAL | Status: DC

## 2015-06-07 MED ORDER — MIDAZOLAM HCL 2 MG/2ML IJ SOLN
INTRAMUSCULAR | Status: DC | PRN
  Administered 2015-06-07: 1 mg via INTRAVENOUS

## 2015-06-07 MED ORDER — OXYBUTYNIN CHLORIDE ER 10 MG PO TB24: 10.0000 mg | ORAL_TABLET | Freq: Every day | ORAL | Status: DC

## 2015-06-07 MED ORDER — LACTATED RINGERS IV SOLN
INTRAVENOUS | Status: DC
  Administered 2015-06-07: 11:00:00 via INTRAVENOUS
  Filled 2015-06-07: qty 1000

## 2015-06-07 MED ORDER — FENTANYL CITRATE (PF) 100 MCG/2ML IJ SOLN
25.0000 ug | INTRAMUSCULAR | Status: DC | PRN
  Filled 2015-06-07: qty 1

## 2015-06-07 MED ORDER — GLYCOPYRROLATE 0.2 MG/ML IJ SOLN
INTRAMUSCULAR | Status: DC | PRN
  Administered 2015-06-07: 0.2 mg via INTRAVENOUS

## 2015-06-07 MED ORDER — SODIUM CHLORIDE 0.9 % IR SOLN
Status: DC | PRN
  Administered 2015-06-07: 4000 mL

## 2015-06-07 MED ORDER — DEXAMETHASONE SODIUM PHOSPHATE 4 MG/ML IJ SOLN
INTRAMUSCULAR | Status: DC | PRN
  Administered 2015-06-07: 8 mg via INTRAVENOUS

## 2015-06-07 MED ORDER — LIDOCAINE HCL (CARDIAC) 20 MG/ML IV SOLN
INTRAVENOUS | Status: DC | PRN
  Administered 2015-06-07: 80 mg via INTRAVENOUS

## 2015-06-07 MED ORDER — PROPOFOL 10 MG/ML IV BOLUS
INTRAVENOUS | Status: DC | PRN
  Administered 2015-06-07: 180 mg via INTRAVENOUS

## 2015-06-07 MED ORDER — CEFAZOLIN SODIUM-DEXTROSE 2-3 GM-% IV SOLR
INTRAVENOUS | Status: AC
  Filled 2015-06-07: qty 50

## 2015-06-07 MED ORDER — MEPERIDINE HCL 25 MG/ML IJ SOLN
6.2500 mg | INTRAMUSCULAR | Status: DC | PRN
  Filled 2015-06-07: qty 1

## 2015-06-07 MED ORDER — IOHEXOL 350 MG/ML SOLN
INTRAVENOUS | Status: DC | PRN
  Administered 2015-06-07: 10 mL

## 2015-06-07 MED ORDER — CEFAZOLIN SODIUM-DEXTROSE 2-3 GM-% IV SOLR
2.0000 g | INTRAVENOUS | Status: AC
  Administered 2015-06-07: 2 g via INTRAVENOUS
  Filled 2015-06-07: qty 50

## 2015-06-07 MED ORDER — OXYCODONE-ACETAMINOPHEN 5-325 MG PO TABS: 1.0000 | ORAL_TABLET | Freq: Four times a day (QID) | ORAL | Status: DC | PRN

## 2015-06-07 MED ORDER — FENTANYL CITRATE (PF) 100 MCG/2ML IJ SOLN
INTRAMUSCULAR | Status: AC
  Filled 2015-06-07: qty 4

## 2015-06-07 MED ORDER — CEFAZOLIN SODIUM 1-5 GM-% IV SOLN
1.0000 g | INTRAVENOUS | Status: DC
  Filled 2015-06-07: qty 50

## 2015-06-07 MED ORDER — ONDANSETRON HCL 4 MG/2ML IJ SOLN
INTRAMUSCULAR | Status: DC | PRN
  Administered 2015-06-07: 4 mg via INTRAVENOUS

## 2015-06-07 SURGICAL SUPPLY — 25 items
BAG URO CATCHER STRL LF (DRAPE) ×2 IMPLANT
BASKET LASER NITINOL 1.9FR (BASKET) IMPLANT
BASKET STONE 1.7 NGAGE (UROLOGICAL SUPPLIES) ×2 IMPLANT
BASKET ZERO TIP NITINOL 2.4FR (BASKET) IMPLANT
CANISTER SUCT LVC 12 LTR MEDI- (MISCELLANEOUS) IMPLANT
CATH INTERMIT  6FR 70CM (CATHETERS) IMPLANT
CLOTH BEACON ORANGE TIMEOUT ST (SAFETY) ×2 IMPLANT
FIBER LASER FLEXIVA 365 (UROLOGICAL SUPPLIES) IMPLANT
FIBER LASER TRAC TIP (UROLOGICAL SUPPLIES) ×2 IMPLANT
GLOVE BIO SURGEON STRL SZ 6.5 (GLOVE) ×2 IMPLANT
GLOVE BIO SURGEON STRL SZ8 (GLOVE) ×2 IMPLANT
GLOVE BIOGEL PI IND STRL 6.5 (GLOVE) ×2 IMPLANT
GLOVE BIOGEL PI INDICATOR 6.5 (GLOVE) ×2
GOWN STRL REUS W/ TWL LRG LVL3 (GOWN DISPOSABLE) ×1 IMPLANT
GOWN STRL REUS W/ TWL XL LVL3 (GOWN DISPOSABLE) ×1 IMPLANT
GOWN STRL REUS W/TWL LRG LVL3 (GOWN DISPOSABLE) ×1
GOWN STRL REUS W/TWL XL LVL3 (GOWN DISPOSABLE) ×3 IMPLANT
GUIDEWIRE ANG ZIPWIRE 038X150 (WIRE) ×2 IMPLANT
GUIDEWIRE STR DUAL SENSOR (WIRE) ×2 IMPLANT
IV NS IRRIG 3000ML ARTHROMATIC (IV SOLUTION) ×2 IMPLANT
MANIFOLD NEPTUNE II (INSTRUMENTS) ×2 IMPLANT
PACK CYSTO (CUSTOM PROCEDURE TRAY) ×2 IMPLANT
STENT URET 6FRX26 CONTOUR (STENTS) ×2 IMPLANT
SYRINGE 10CC LL (SYRINGE) ×2 IMPLANT
TUBE FEEDING 8FR 16IN STR KANG (MISCELLANEOUS) IMPLANT

## 2015-06-07 NOTE — Brief Op Note (Signed)
06/07/2015  12:17 PM  PATIENT:  Angela Reyes  54 y.o. female  PRE-OPERATIVE DIAGNOSIS:  RIGHT URETERAL STONE  POST-OPERATIVE DIAGNOSIS:  RIGHT URETERAL STONE  PROCEDURE:  Procedure(s): CYSTOSCOPY WITH RETROGRADE PYELOGRAM/ STONE EXTRACTION AND URETERAL STENT PLACEMENT (Right) HOLMIUM LASER APPLICATION (Right)  SURGEON:  Surgeon(s) and Role:    * Cleon Gustin, MD - Primary  PHYSICIAN ASSISTANT:   ASSISTANTS: none   ANESTHESIA:   general  EBL:  Total I/O In: -  Out: 10 [Blood:10]  BLOOD ADMINISTERED:none  DRAINS: 6x26 JJ stent without tether  LOCAL MEDICATIONS USED:  NONE  SPECIMEN:  Source of Specimen:  stone  DISPOSITION OF SPECIMEN:  PATHOLOGY  COUNTS:  YES  TOURNIQUET:  * No tourniquets in log *  DICTATION: .Note written in EPIC  PLAN OF CARE: Discharge to home after PACU  PATIENT DISPOSITION:  PACU - hemodynamically stable.   Delay start of Pharmacological VTE agent (>24hrs) due to surgical blood loss or risk of bleeding: not applicable

## 2015-06-07 NOTE — Op Note (Signed)
Preoperative diagnosis: Right ureteral stone  Postoperative diagnosis: Same  Procedure: 1 cystoscopy 2 right retrograde pyelography 3.  Intraoperative fluoroscopy, under one hour, with interpretation 4.  Right ureteroscopic stone manipulation with laser lithotripsy 5.  Right 6 x 26 JJ stent placement  Attending: Rosie Fate  Anesthesia: General  Estimated blood loss: None  Drains: Right 6 x 26 JJ ureteral stent without tether  Specimens: stone for analysis  Antibiotics: ancef  Findings: numerous distal ureteral stones. Mild right hydroureteronephrosis. Urethral caruncle.  Indications: Patient is a 54 year old female with a history of ureteral stone status post ESWL and who has failed medical expulsive therapy.  After discussing treatment options, she decided proceed with right ureteroscopic stone manipulation.  Procedure her in detail: The patient was brought to the operating room and a brief timeout was done to ensure correct patient, correct procedure, correct site.  General anesthesia was administered patient was placed in dorsal lithotomy position.  Her genitalia was then prepped and draped in usual sterile fashion.  A rigid 27 French cystoscope was passed in the urethra and the bladder.  Bladder was inspected free masses or lesions.  the right ureteral orifices were in the normal orthotopic locations.  a 6 french ureteral catheter was then instilled into the right ureter orifice.  a gentle retrograde was obtained and findings noted above.  we then placed a zip wire through the ureteral catheter and advanced up to the renal pelvis.  we then removed the cystoscope and cannulated the right ureteral orifice with a semirigid ureteroscope.  we then encountered the multiple stones in the distal ureter.  using using a 200 nm laser fiber and fragmented the stone into smaller pieces.  the pieces were then removed with a Ngage basket.  Once all stone fragments were removed we then placed a  6 x 26 double-j ureteral stent over the original zip wire. We then removed the wire and good coil was noted in the the renal pelvis under fluoroscopy and the bladder under direct vision.     the stone fragments were then removed from the bladder and sent for analysis.   the bladder was then drained and this concluded the procedure which was well tolerated by patient.  Complications: None  Condition: Stable, extubated, transferred to PACU  Plan: Patient is to be discharged home as to follow-up in one week for stent removal.

## 2015-06-07 NOTE — Anesthesia Procedure Notes (Signed)
Procedure Name: LMA Insertion Date/Time: 06/07/2015 11:41 AM Performed by: Nicolette Bang L Pre-anesthesia Checklist: Patient identified, Emergency Drugs available, Suction available, Patient being monitored and Timeout performed Patient Re-evaluated:Patient Re-evaluated prior to inductionOxygen Delivery Method: Circle system utilized Preoxygenation: Pre-oxygenation with 100% oxygen Intubation Type: IV induction Ventilation: Mask ventilation without difficulty LMA: LMA inserted LMA Size: 3.0 Number of attempts: 1

## 2015-06-07 NOTE — Discharge Instructions (Signed)
Kidney Stones Kidney stones (urolithiasis) are deposits that form inside your kidneys. The intense pain is caused by the stone moving through the urinary tract. When the stone moves, the ureter goes into spasm around the stone. The stone is usually passed in the urine.  CAUSES  1. A disorder that makes certain neck glands produce too much parathyroid hormone (primary hyperparathyroidism). 2. A buildup of uric acid crystals, similar to gout in your joints. 3. Narrowing (stricture) of the ureter. 4. A kidney obstruction present at birth (congenital obstruction). 5. Previous surgery on the kidney or ureters. 6. Numerous kidney infections. SYMPTOMS   Feeling sick to your stomach (nauseous).  Throwing up (vomiting).  Blood in the urine (hematuria).  Pain that usually spreads (radiates) to the groin.  Frequency or urgency of urination. DIAGNOSIS   Taking a history and physical exam.  Blood or urine tests.  CT scan.  Occasionally, an examination of the inside of the urinary bladder (cystoscopy) is performed. TREATMENT   Observation.  Increasing your fluid intake.  Extracorporeal shock wave lithotripsy--This is a noninvasive procedure that uses shock waves to break up kidney stones.  Surgery may be needed if you have severe pain or persistent obstruction. There are various surgical procedures. Most of the procedures are performed with the use of small instruments. Only small incisions are needed to accommodate these instruments, so recovery time is minimized. The size, location, and chemical composition are all important variables that will determine the proper choice of action for you. Talk to your health care provider to better understand your situation so that you will minimize the risk of injury to yourself and your kidney.  HOME CARE INSTRUCTIONS   Drink enough water and fluids to keep your urine clear or pale yellow. This will help you to pass the stone or stone  fragments.  Strain all urine through the provided strainer. Keep all particulate matter and stones for your health care provider to see. The stone causing the pain may be as small as a grain of salt. It is very important to use the strainer each and every time you pass your urine. The collection of your stone will allow your health care provider to analyze it and verify that a stone has actually passed. The stone analysis will often identify what you can do to reduce the incidence of recurrences.  Only take over-the-counter or prescription medicines for pain, discomfort, or fever as directed by your health care provider.  Make a follow-up appointment with your health care provider as directed.  Get follow-up X-rays if required. The absence of pain does not always mean that the stone has passed. It may have only stopped moving. If the urine remains completely obstructed, it can cause loss of kidney function or even complete destruction of the kidney. It is your responsibility to make sure X-rays and follow-ups are completed. Ultrasounds of the kidney can show blockages and the status of the kidney. Ultrasounds are not associated with any radiation and can be performed easily in a matter of minutes. SEEK MEDICAL CARE IF:  You experience pain that is progressive and unresponsive to any pain medicine you have been prescribed. SEEK IMMEDIATE MEDICAL CARE IF:   Pain cannot be controlled with the prescribed medicine.  You have a fever or shaking chills.  The severity or intensity of pain increases over 18 hours and is not relieved by pain medicine.  You develop a new onset of abdominal pain.  You feel faint or pass out.  You are unable to urinate. MAKE SURE YOU:   Understand these instructions.  Will watch your condition.  Will get help right away if you are not doing well or get worse. Document Released: 10/07/2005 Document Revised: 06/09/2013 Document Reviewed: 03/10/2013 [facility]  Patient Information 2015 [place], [place]. This information is not intended to replace advice given to you by your health care provider. Make sure you discuss any questions you have with your health care provider.   [facility] [phone] Post Ureteroscopy With or Without Stent Instructions  Definitions:  Ureter: The duct that transports urine from the kidney to the bladder. Stent:   A plastic hollow tube that is placed into the ureter, from the kidney to the bladder to prevent the ureter from swelling shut.  GENERAL INSTRUCTIONS:  Despite the fact that no skin incisions were used, the area around the ureter and bladder is raw and irritated. The stent is a foreign body which will further irritate the bladder wall. This irritation is manifested by increased frequency of urination, both day and night, and by an increase in the urge to urinate. In some, the urge to urinate is present almost always. Sometimes the urge is strong enough that you may not be able to stop yourself from urinating. The only real cure is to remove the stent and then give time for the bladder wall to heal which can't be done until the danger of the ureter swelling shut has passed, which varies.  You may see some blood in your urine while the stent is in place and a few days afterwards. Do not be alarmed, even if the urine was clear for a while. Get off your feet and drink lots of fluids until clearing occurs. If you start to pass clots or don't improve, call us.  DIET: You may return to your normal diet immediately. Because of the raw surface of your bladder, alcohol, spicy foods, acid type foods and drinks with caffeine may cause irritation or frequency and should be used in moderation. To keep your urine flowing freely and to avoid constipation, drink plenty of fluids during the day ( 8-10 glasses ). Tip: Avoid cranberry juice because it is very acidic.  ACTIVITY: Your physical activity doesn't need to  be restricted. However, if you are very active, you may see some blood in your urine. We suggest that you reduce your activity under these circumstances until the bleeding has stopped.  BOWELS: It is important to keep your bowels regular during the postoperative period. Straining with bowel movements can cause bleeding. A bowel movement every other day is reasonable. Use a mild laxative if needed, such as Milk of Magnesia 2-3 tablespoons, or 2 Dulcolax tablets. Call if you continue to have problems. If you have been taking narcotics for pain, before, during or after your surgery, you may be constipated. Take a laxative if necessary.   MEDICATION: You should resume your pre-surgery medications unless told not to. In addition you will often be given an antibiotic to prevent infection. These should be taken as prescribed until the bottles are finished unless you are having an unusual reaction to one of the drugs.  PROBLEMS YOU SHOULD REPORT TO [place]:  Fevers over 100.5 Fahrenheit.  Heavy bleeding, or clots ( See above notes about blood in urine ).  Inability to urinate.  Drug reactions ( hives, rash, nausea, vomiting, diarrhea ).  Severe burning or pain with urination that is not improving.  FOLLOW-UP: You will need a follow-up  appointment to monitor your progress. Call for this appointment at the number listed above. Usually the first appointment will be about three to fourteen days after your surgery.      Post Anesthesia Home Care Instructions  Activity: Get plenty of rest for the remainder of the day. A responsible adult should stay with you for 24 hours following the procedure.  For the next 24 hours, DO NOT: -Drive a car -Paediatric nurse -Drink alcoholic beverages -Take any medication unless instructed by your physician -Make any legal decisions or sign important papers.  Meals: Start with liquid foods such as gelatin or soup. Progress to regular foods as tolerated. Avoid  greasy, spicy, heavy foods. If nausea and/or vomiting occur, drink only clear liquids until the nausea and/or vomiting subsides. Call your physician if vomiting continues.  Special Instructions/Symptoms: Your throat may feel dry or sore from the anesthesia or the breathing tube placed in your throat during surgery. If this causes discomfort, gargle with warm salt water. The discomfort should disappear within 24 hours.  If you had a scopolamine patch placed behind your ear for the management of post- operative nausea and/or vomiting:  1. The medication in the patch is effective for 72 hours, after which it should be removed.  Wrap patch in a tissue and discard in the trash. Wash hands thoroughly with soap and water. 2. You may remove the patch earlier than 72 hours if you experience unpleasant side effects which may include dry mouth, dizziness or visual disturbances. 3. Avoid touching the patch. Wash your hands with soap and water after contact with the patch.

## 2015-06-07 NOTE — Anesthesia Preprocedure Evaluation (Signed)
Anesthesia Evaluation  Patient identified by MRN, date of birth, ID band Patient awake    Reviewed: Allergy & Precautions, NPO status , Patient's Chart, lab work & pertinent test results  Airway Mallampati: II  TM Distance: >3 FB Neck ROM: Full    Dental no notable dental hx.    Pulmonary asthma , sleep apnea ,  breath sounds clear to auscultation  Pulmonary exam normal       Cardiovascular negative cardio ROS Normal cardiovascular examRhythm:Regular Rate:Normal     Neuro/Psych negative neurological ROS  negative psych ROS   GI/Hepatic Neg liver ROS, GERD-  Medicated and Controlled,  Endo/Other  negative endocrine ROS  Renal/GU negative Renal ROS  negative genitourinary   Musculoskeletal negative musculoskeletal ROS (+)   Abdominal   Peds negative pediatric ROS (+)  Hematology negative hematology ROS (+)   Anesthesia Other Findings   Reproductive/Obstetrics negative OB ROS                             Anesthesia Physical Anesthesia Plan  ASA: II  Anesthesia Plan: General   Post-op Pain Management:    Induction: Intravenous  Airway Management Planned: LMA  Additional Equipment:   Intra-op Plan:   Post-operative Plan: Extubation in OR  Informed Consent: I have reviewed the patients History and Physical, chart, labs and discussed the procedure including the risks, benefits and alternatives for the proposed anesthesia with the patient or authorized representative who has indicated his/her understanding and acceptance.   Dental advisory given  Plan Discussed with: CRNA  Anesthesia Plan Comments:         Anesthesia Quick Evaluation

## 2015-06-07 NOTE — Transfer of Care (Signed)
Immediate Anesthesia Transfer of Care Note  Patient: Angela Reyes  Procedure(s) Performed: Procedure(s): CYSTOSCOPY WITH RETROGRADE PYELOGRAM/ STONE EXTRACTION AND URETERAL STENT PLACEMENT (Right) HOLMIUM LASER APPLICATION (Right)  Patient Location: PACU  Anesthesia Type:General  Level of Consciousness: awake, alert  and oriented  Airway & Oxygen Therapy: Patient Spontanous Breathing and Patient connected to nasal cannula oxygen  Post-op Assessment: Report given to RN and Post -op Vital signs reviewed and stable  Post vital signs: Reviewed and stable  Last Vitals:  Filed Vitals:   06/07/15 0949  BP: 147/77  Pulse: 72  Temp: 36.6 C  Resp: 16    Complications: No apparent anesthesia complications

## 2015-06-08 ENCOUNTER — Encounter (HOSPITAL_BASED_OUTPATIENT_CLINIC_OR_DEPARTMENT_OTHER): Payer: Self-pay | Admitting: Urology

## 2015-06-08 NOTE — H&P (Signed)
Urology Admission H&P  Chief Complaint: right flank pain  History of Present Illness: Angela Reyes is a 54yo with persistent right flank pain and numerous distal ureteral stones. Angela Reyes is s/p R ESWL and has failed to pass the stone fragments. Angela Reyes denies fevers/chills/sweats  Past Medical History  Diagnosis Date  . GERD (gastroesophageal reflux disease)   . Asthma   . Right ureteral stone   . Chronic rhinosinusitis   . History of gestational diabetes   . Pulmonary nodule, right     middle lobe-- stable low risk--  pulmologist,  dr Chase Caller  . History of hiatal hernia   . OSA (obstructive sleep apnea)     non-compliant cpap   Past Surgical History  Procedure Laterality Date  . Extracorporeal shock wave lithotripsy Right 05-15-2015  . Tonsillectomy  age 26  . Wisdom tooth extraction  age 32  . Cystoscopy w/ ureteral stent placement Right 06/07/2015    Procedure: CYSTOSCOPY WITH RETROGRADE PYELOGRAM/ STONE EXTRACTION AND URETERAL STENT PLACEMENT;  Surgeon: Cleon Gustin, MD;  Location: [facility];  Service: Urology;  Laterality: Right;  . Holmium laser application Right 6/72/0947    Procedure: HOLMIUM LASER APPLICATION;  Surgeon: Cleon Gustin, MD;  Location: [facility];  Service: Urology;  Laterality: Right;    Home Medications:  No prescriptions prior to admission   Allergies: No Known Allergies  Family History  Problem Relation Age of Onset  . Allergies      siblings  . Hypertension Mother   . Hypertension Father   . Kidney disease Brother   . Hypertension Maternal Grandmother   . Cancer Paternal Grandfather    Social History:  reports that Angela Reyes has never smoked. Angela Reyes has never used smokeless tobacco. Angela Reyes reports that Angela Reyes does not drink alcohol or use illicit drugs.  Review of Systems  Genitourinary: Positive for flank pain.  All other systems reviewed and are negative.   Physical Exam:  Vital signs in last 24 hours:   Physical  Exam  Constitutional: Angela Reyes is oriented to person, place, and time. Angela Reyes appears well-developed and well-nourished.  HENT:  Head: Normocephalic and atraumatic.  Eyes: EOM are normal. Pupils are equal, round, and reactive to light.  Neck: Normal range of motion. Neck supple.  Cardiovascular: Normal rate and regular rhythm.   Respiratory: Breath sounds normal.  GI: Soft. Angela Reyes exhibits no distension.  Musculoskeletal: Normal range of motion.  Neurological: Angela Reyes is alert and oriented to person, place, and time.  Skin: Skin is warm and dry.  Psychiatric: Angela Reyes has a normal mood and affect. Her behavior is normal. Judgment and thought content normal.    Laboratory Data:  No results found for this or any previous visit (from the past 24 hour(s)). No results found for this or any previous visit (from the past 240 hour(s)). Creatinine: No results for input(s): CREATININE in the last 168 hours.   Impression/Assessment:  Right ureteral stone  Plan:  Risks/benefits/alternatives to right stone extraction was explained to the patient and Angela Reyes understands and wishes to proceed with surgery  Trent Gabler L 06/08/2015, 7:28 PM

## 2015-06-08 NOTE — Anesthesia Postprocedure Evaluation (Signed)
  Anesthesia Post-op Note  Patient: Angela Reyes  Procedure(s) Performed: Procedure(s) (LRB): CYSTOSCOPY WITH RETROGRADE PYELOGRAM/ STONE EXTRACTION AND URETERAL STENT PLACEMENT (Right) HOLMIUM LASER APPLICATION (Right)  Patient Location: PACU  Anesthesia Type: General  Level of Consciousness: awake and alert   Airway and Oxygen Therapy: Patient Spontanous Breathing  Post-op Pain: mild  Post-op Assessment: Post-op Vital signs reviewed, Patient's Cardiovascular Status Stable, Respiratory Function Stable, Patent Airway and No signs of Nausea or vomiting  Last Vitals:  Filed Vitals:   06/07/15 1334  BP: 155/78  Pulse: 61  Temp: 36.3 C  Resp: 14    Post-op Vital Signs: stable   Complications: No apparent anesthesia complications

## 2015-10-13 ENCOUNTER — Ambulatory Visit (INDEPENDENT_AMBULATORY_CARE_PROVIDER_SITE_OTHER): Payer: BLUE CROSS/BLUE SHIELD | Admitting: Family Medicine

## 2015-10-13 ENCOUNTER — Encounter: Payer: Self-pay | Admitting: Family Medicine

## 2015-10-13 VITALS — BP 106/82 | HR 65 | Temp 97.7°F | Resp 16 | Wt 188.2 lb

## 2015-10-13 DIAGNOSIS — J0100 Acute maxillary sinusitis, unspecified: Secondary | ICD-10-CM | POA: Diagnosis not present

## 2015-10-13 MED ORDER — AMOXICILLIN-POT CLAVULANATE 875-125 MG PO TABS: 1.0000 | ORAL_TABLET | Freq: Two times a day (BID) | ORAL | Status: DC

## 2015-10-13 MED ORDER — HYDROCODONE-HOMATROPINE 5-1.5 MG/5ML PO SYRP: ORAL_SOLUTION | ORAL | Status: DC

## 2015-10-13 NOTE — Patient Instructions (Signed)
Continue Mucinex D and Proair inhaler as needed.

## 2015-10-13 NOTE — Progress Notes (Addendum)
Subjective:     Patient ID: Angela Reyes, female   DOB: 10-09-1961, 54 y.o.   MRN: [id number]  HPI  Chief Complaint  Patient presents with  . Cough    Paitent comes in office today with concerns of cough and congestion for the past 3 weeks. Patient states that she is coughing up green phlegm, post nasal drip, chest congestion and shortness of breath. Patient has used her rescue inhaler Pro-Air and otc: Mucinex D and cough syrup.   Reports compliance with maintenance inhaler and states asthma is under control.   Review of Systems  Constitutional: Negative for fever and chills.       Objective:   Physical Exam  Constitutional: She appears well-developed and well-nourished. No distress.  Ears: T.M's intact without inflammation Sinuses: mild maxillary sinus tenderness Throat: tonsils absent, no erythema Neck: no cervical adenopathy Lungs: left basilar inspiratory wheeze     Assessment:    1. Acute maxillary sinusitis, recurrence not specified - amoxicillin-clavulanate (AUGMENTIN) 875-125 MG tablet; Take 1 tablet by mouth 2 (two) times daily.  Dispense: 20 tablet; Refill: 0 - HYDROcodone-homatropine (HYCODAN) 5-1.5 MG/5ML syrup; 5 ml 4-6 hours as needed for cough  Dispense: 240 mL; Refill: 0    Plan:    Continue Mucinex D

## 2016-01-15 ENCOUNTER — Ambulatory Visit (INDEPENDENT_AMBULATORY_CARE_PROVIDER_SITE_OTHER): Payer: BLUE CROSS/BLUE SHIELD | Admitting: Physician Assistant

## 2016-01-15 ENCOUNTER — Encounter: Payer: Self-pay | Admitting: Physician Assistant

## 2016-01-15 VITALS — BP 106/76 | HR 84 | Temp 97.7°F | Resp 16 | Wt 185.0 lb

## 2016-01-15 DIAGNOSIS — J0100 Acute maxillary sinusitis, unspecified: Secondary | ICD-10-CM

## 2016-01-15 DIAGNOSIS — J4551 Severe persistent asthma with (acute) exacerbation: Secondary | ICD-10-CM

## 2016-01-15 DIAGNOSIS — J40 Bronchitis, not specified as acute or chronic: Secondary | ICD-10-CM | POA: Diagnosis not present

## 2016-01-15 MED ORDER — PREDNISONE 10 MG PO TABS: ORAL_TABLET | ORAL | Status: DC

## 2016-01-15 MED ORDER — FLUTICASONE FUROATE-VILANTEROL 200-25 MCG/INH IN AEPB: 1.0000 | INHALATION_SPRAY | Freq: Every evening | RESPIRATORY_TRACT | Status: DC

## 2016-01-15 MED ORDER — AMOXICILLIN-POT CLAVULANATE 875-125 MG PO TABS
1.0000 | ORAL_TABLET | Freq: Two times a day (BID) | ORAL | Status: DC
Start: 2016-01-15 — End: 2016-02-22

## 2016-01-15 NOTE — Patient Instructions (Signed)
Acute Bronchitis Bronchitis is inflammation of the airways that extend from the windpipe into the lungs (bronchi). The inflammation often causes mucus to develop. This leads to a cough, which is the most common symptom of bronchitis.  In acute bronchitis, the condition usually develops suddenly and goes away over time, usually in a couple weeks. Smoking, allergies, and asthma can make bronchitis worse. Repeated episodes of bronchitis may cause further lung problems.  CAUSES Acute bronchitis is most often caused by the same virus that causes a cold. The virus can spread from person to person (contagious) through coughing, sneezing, and touching contaminated objects. SIGNS AND SYMPTOMS   Cough.   Fever.   Coughing up mucus.   Body aches.   Chest congestion.   Chills.   Shortness of breath.   Sore throat.  DIAGNOSIS  Acute bronchitis is usually diagnosed through a physical exam. Your health care provider will also ask you questions about your medical history. Tests, such as chest X-rays, are sometimes done to rule out other conditions.  TREATMENT  Acute bronchitis usually goes away in a couple weeks. Oftentimes, no medical treatment is necessary. Medicines are sometimes given for relief of fever or cough. Antibiotic medicines are usually not needed but may be prescribed in certain situations. In some cases, an inhaler may be recommended to help reduce shortness of breath and control the cough. A cool mist vaporizer may also be used to help thin bronchial secretions and make it easier to clear the chest.  HOME CARE INSTRUCTIONS  Get plenty of rest.   Drink enough fluids to keep your urine clear or pale yellow (unless you have a medical condition that requires fluid restriction). Increasing fluids may help thin your respiratory secretions (sputum) and reduce chest congestion, and it will prevent dehydration.   Take medicines only as directed by your health care provider.  If  you were prescribed an antibiotic medicine, finish it all even if you start to feel better.  Avoid smoking and secondhand smoke. Exposure to cigarette smoke or irritating chemicals will make bronchitis worse. If you are a smoker, consider using nicotine gum or skin patches to help control withdrawal symptoms. Quitting smoking will help your lungs heal faster.   Reduce the chances of another bout of acute bronchitis by washing your hands frequently, avoiding people with cold symptoms, and trying not to touch your hands to your mouth, nose, or eyes.   Keep all follow-up visits as directed by your health care provider.  SEEK MEDICAL CARE IF: Your symptoms do not improve after 1 week of treatment.  SEEK IMMEDIATE MEDICAL CARE IF:  You develop an increased fever or chills.   You have chest pain.   You have severe shortness of breath.  You have bloody sputum.   You develop dehydration.  You faint or repeatedly feel like you are going to pass out.  You develop repeated vomiting.  You develop a severe headache. MAKE SURE YOU:   Understand these instructions.  Will watch your condition.  Will get help right away if you are not doing well or get worse.   This information is not intended to replace advice given to you by your health care provider. Make sure you discuss any questions you have with your health care provider.   Document Released: 11/14/2004 Document Revised: 10/28/2014 Document Reviewed: 03/30/2013 Elsevier Interactive Patient Education 2016 Elsevier Inc[employer] Sinusitis, Adult Sinusitis is redness, soreness, and inflammation of the paranasal sinuses. Paranasal sinuses are air pockets within the   bones of your face. They are located beneath your eyes, in the middle of your forehead, and above your eyes. In healthy paranasal sinuses, mucus is able to drain out, and air is able to circulate through them by way of your nose. However, when your paranasal sinuses are inflamed,  mucus and air can become trapped. This can allow bacteria and other germs to grow and cause infection. Sinusitis can develop quickly and last only a short time (acute) or continue over a long period (chronic). Sinusitis that lasts for more than 12 weeks is considered chronic. CAUSES Causes of sinusitis include:  Allergies.  Structural abnormalities, such as displacement of the cartilage that separates your nostrils (deviated septum), which can decrease the air flow through your nose and sinuses and affect sinus drainage.  Functional abnormalities, such as when the small hairs (cilia) that line your sinuses and help remove mucus do not work properly or are not present. SIGNS AND SYMPTOMS Symptoms of acute and chronic sinusitis are the same. The primary symptoms are pain and pressure around the affected sinuses. Other symptoms include:  Upper toothache.  Earache.  Headache.  Bad breath.  Decreased sense of smell and taste.  A cough, which worsens when you are lying flat.  Fatigue.  Fever.  Thick drainage from your nose, which often is green and may contain pus (purulent).  Swelling and warmth over the affected sinuses. DIAGNOSIS Your health care provider will perform a physical exam. During your exam, your health care provider may perform any of the following to help determine if you have acute sinusitis or chronic sinusitis:  Look in your nose for signs of abnormal growths in your nostrils (nasal polyps).  Tap over the affected sinus to check for signs of infection.  View the inside of your sinuses using an imaging device that has a light attached (endoscope). If your health care provider suspects that you have chronic sinusitis, one or more of the following tests may be recommended:  Allergy tests.  Nasal culture. A sample of mucus is taken from your nose, sent to a lab, and screened for bacteria.  Nasal cytology. A sample of mucus is taken from your nose and examined by  your health care provider to determine if your sinusitis is related to an allergy. TREATMENT Most cases of acute sinusitis are related to a viral infection and will resolve on their own within 10 days. Sometimes, medicines are prescribed to help relieve symptoms of both acute and chronic sinusitis. These may include pain medicines, decongestants, nasal steroid sprays, or saline sprays. However, for sinusitis related to a bacterial infection, your health care provider will prescribe antibiotic medicines. These are medicines that will help kill the bacteria causing the infection. Rarely, sinusitis is caused by a fungal infection. In these cases, your health care provider will prescribe antifungal medicine. For some cases of chronic sinusitis, surgery is needed. Generally, these are cases in which sinusitis recurs more than 3 times per year, despite other treatments. HOME CARE INSTRUCTIONS  Drink plenty of water. Water helps thin the mucus so your sinuses can drain more easily.  Use a humidifier.  Inhale steam 3-4 times a day (for example, sit in the bathroom with the shower running).  Apply a warm, moist washcloth to your face 3-4 times a day, or as directed by your health care provider.  Use saline nasal sprays to help moisten and clean your sinuses.  Take medicines only as directed by your health care provider.  If   you were prescribed either an antibiotic or antifungal medicine, finish it all even if you start to feel better. SEEK IMMEDIATE MEDICAL CARE IF:  You have increasing pain or severe headaches.  You have nausea, vomiting, or drowsiness.  You have swelling around your face.  You have vision problems.  You have a stiff neck.  You have difficulty breathing.   This information is not intended to replace advice given to you by your health care provider. Make sure you discuss any questions you have with your health care provider.   Document Released: 10/07/2005 Document  Revised: 10/28/2014 Document Reviewed: 10/22/2011 Elsevier Interactive Patient Education 2016 Elsevier Inc.  

## 2016-01-15 NOTE — Progress Notes (Signed)
Patient ID: Angela Reyes, female   DOB: 1961-06-17, 55 y.o.   MRN: [id number]       Patient: Angela Reyes Female    DOB: 1961/03/03   55 y.o.   MRN: [id number] Visit Date: 01/15/2016  Today's Provider: Mar Daring, PA-C   Chief Complaint  Patient presents with  . URI   Subjective:    HPI Upper Respiratory Infection: Patient complains of symptoms of a URI. Symptoms include nasal congestion, cough and bilateral ear pain. Onset of symptoms was 3 weeks ago, gradually worsening since that time. She also c/o cough described as nonproductive and chest tightness for the past 1 week .  She is drinking plenty of fluids. Evaluation to date: none. Treatment to date: antihistamines and albuterol inhaler. Patient reports she has been using her albuterol 2-3 times a day for 1 week.      No Known Allergies Previous Medications   ALBUTEROL (PROAIR HFA) 108 (90 BASE) MCG/ACT INHALER    Inhale into the lungs.   CETIRIZINE (ZYRTEC) 10 MG TABLET    Take 10 mg by mouth at bedtime. Reported on 10/13/2015   FLUTICASONE FUROATE-VILANTEROL 200-25 MCG/INH AEPB    Inhale 1 puff into the lungs every evening.    MOMETASONE (NASONEX) 50 MCG/ACT NASAL SPRAY    Place 2 sprays into the nose daily.   MONTELUKAST (SINGULAIR) 10 MG TABLET    Take 1 tablet (10 mg total) by mouth at bedtime.    Review of Systems  Constitutional: Negative.   HENT: Positive for congestion, ear pain, postnasal drip and sinus pressure.   Respiratory: Positive for cough and chest tightness. Negative for shortness of breath and wheezing.   Cardiovascular: Negative for chest pain.  Gastrointestinal: Negative for nausea, vomiting and abdominal pain.  Neurological: Negative for dizziness and headaches.    Social History  Substance Use Topics  . Smoking status: Never Smoker   . Smokeless tobacco: Never Used  . Alcohol Use: No   Objective:   BP 106/76 mmHg  Pulse 84  Temp(Src) 97.7 F (36.5 C) (Oral)  Resp 16  Wt 185 lb  (83.915 kg)  SpO2 97%  LMP 02/10/2015  Physical Exam  Constitutional: She appears well-developed and well-nourished. No distress.  HENT:  Head: Normocephalic and atraumatic.  Right Ear: Hearing, external ear and ear canal normal. Tympanic membrane is not erythematous and not bulging. A middle ear effusion is present.  Left Ear: Hearing, external ear and ear canal normal. Tympanic membrane is not erythematous and not bulging. A middle ear effusion is present.  Nose: Mucosal edema and rhinorrhea present. Right sinus exhibits maxillary sinus tenderness. Right sinus exhibits no frontal sinus tenderness. Left sinus exhibits maxillary sinus tenderness. Left sinus exhibits no frontal sinus tenderness.  Mouth/Throat: Uvula is midline, oropharynx is clear and moist and mucous membranes are normal. No oropharyngeal exudate, posterior oropharyngeal edema or posterior oropharyngeal erythema.  Eyes: Conjunctivae are normal. Pupils are equal, round, and reactive to light. Right eye exhibits no discharge. Left eye exhibits no discharge. No scleral icterus.  Neck: Normal range of motion. Neck supple. No tracheal deviation present. No thyromegaly present.  Cardiovascular: Normal rate, regular rhythm and normal heart sounds.  Exam reveals no gallop and no friction rub.   No murmur heard. Pulmonary/Chest: Effort normal. No stridor. No respiratory distress. She has no decreased breath sounds. She has wheezes in the right lower field and the left lower field. She has no rhonchi. She has no rales.  Lymphadenopathy:    She has no cervical adenopathy.  Skin: Skin is warm and dry. She is not diaphoretic.  Vitals reviewed.       Assessment & Plan:     1. Acute maxillary sinusitis, recurrence not specified Worsening symptoms that have not responded to over-the-counter medications. I will give Augmentin as below. She is to continue her antihistamine and Flonase. She may take Mucinex DM for congestion. She needs to  make sure to stay well-hydrated and get plenty of rest. She is to call the office if symptoms fail to improve or worsen. - amoxicillin-clavulanate (AUGMENTIN) 875-125 MG tablet; Take 1 tablet by mouth 2 (two) times daily.  Dispense: 20 tablet; Refill: 0  2. Bronchitis She did have wheezing in bilateral lower lung fields. She has been increasing use of her albuterol inhaler without relief. I will give a 12 day prednisone taper as below. She is to call the office if symptoms fail to improve or worsen. - predniSONE (DELTASONE) 10 MG tablet; Take 6 tabs PO on day 1&2, 5 tabs PO on day 3&4, 4 tabs PO on day 5&6, 3 tabs PO on day 7&8, 2 tabs PO on day 9&10, 1 tab PO on day 11&12.  Dispense: 42 tablet; Refill: 0  3. Asthma, severe persistent, with acute exacerbation Stable. Diagnosis was pulled to refill medication as below. Continue current medical treatment plan. - fluticasone furoate-vilanterol (BREO ELLIPTA) 200-25 MCG/INH AEPB; Inhale 1 puff into the lungs every evening.  Dispense: 60 each; Refill: Jennette, PA-C  [facility]

## 2016-01-19 ENCOUNTER — Other Ambulatory Visit: Payer: Self-pay

## 2016-01-19 DIAGNOSIS — J45991 Cough variant asthma: Secondary | ICD-10-CM

## 2016-01-19 MED ORDER — ALBUTEROL SULFATE HFA 108 (90 BASE) MCG/ACT IN AERS: 2.0000 | INHALATION_SPRAY | Freq: Four times a day (QID) | RESPIRATORY_TRACT | Status: DC | PRN

## 2016-01-30 DIAGNOSIS — H00014 Hordeolum externum left upper eyelid: Secondary | ICD-10-CM | POA: Diagnosis not present

## 2016-02-22 ENCOUNTER — Ambulatory Visit (INDEPENDENT_AMBULATORY_CARE_PROVIDER_SITE_OTHER): Payer: BLUE CROSS/BLUE SHIELD | Admitting: Physician Assistant

## 2016-02-22 ENCOUNTER — Encounter: Payer: Self-pay | Admitting: Physician Assistant

## 2016-02-22 VITALS — BP 130/82 | HR 80 | Temp 98.0°F | Resp 16 | Wt 189.8 lb

## 2016-02-22 DIAGNOSIS — J4551 Severe persistent asthma with (acute) exacerbation: Secondary | ICD-10-CM

## 2016-02-22 DIAGNOSIS — J302 Other seasonal allergic rhinitis: Secondary | ICD-10-CM

## 2016-02-22 DIAGNOSIS — R911 Solitary pulmonary nodule: Secondary | ICD-10-CM | POA: Diagnosis not present

## 2016-02-22 MED ORDER — PREDNISONE 10 MG PO TABS: ORAL_TABLET | ORAL | Status: DC

## 2016-02-22 MED ORDER — AZITHROMYCIN 250 MG PO TABS: ORAL_TABLET | ORAL | Status: DC

## 2016-02-22 NOTE — Patient Instructions (Signed)
Asthma, Adult Asthma is a recurring condition in which the airways tighten and narrow. Asthma can make it difficult to breathe. It can cause coughing, wheezing, and shortness of breath. Asthma episodes, also called asthma attacks, range from minor to life-threatening. Asthma cannot be cured, but medicines and lifestyle changes can help control it. CAUSES Asthma is believed to be caused by inherited (genetic) and environmental factors, but its exact cause is unknown. Asthma may be triggered by allergens, lung infections, or irritants in the air. Asthma triggers are different for each person. Common triggers include:   Animal dander.  Dust mites.  Cockroaches.  Pollen from trees or grass.  Mold.  Smoke.  Air pollutants such as dust, household cleaners, hair sprays, aerosol sprays, paint fumes, strong chemicals, or strong odors.  Cold air, weather changes, and winds (which increase molds and pollens in the air).  Strong emotional expressions such as crying or laughing hard.  Stress.  Certain medicines (such as aspirin) or types of drugs (such as beta-blockers).  Sulfites in foods and drinks. Foods and drinks that may contain sulfites include dried fruit, potato chips, and sparkling grape juice.  Infections or inflammatory conditions such as the flu, a cold, or an inflammation of the nasal membranes (rhinitis).  Gastroesophageal reflux disease (GERD).  Exercise or strenuous activity. SYMPTOMS Symptoms may occur immediately after asthma is triggered or many hours later. Symptoms include:  Wheezing.  Excessive nighttime or early morning coughing.  Frequent or severe coughing with a common cold.  Chest tightness.  Shortness of breath. DIAGNOSIS  The diagnosis of asthma is made by a review of your medical history and a physical exam. Tests may also be performed. These may include:  Lung function studies. These tests show how much air you breathe in and out.  Allergy  tests.  Imaging tests such as X-rays. TREATMENT  Asthma cannot be cured, but it can usually be controlled. Treatment involves identifying and avoiding your asthma triggers. It also involves medicines. There are 2 classes of medicine used for asthma treatment:   Controller medicines. These prevent asthma symptoms from occurring. They are usually taken every day.  Reliever or rescue medicines. These quickly relieve asthma symptoms. They are used as needed and provide short-term relief. Your health care provider will help you create an asthma action plan. An asthma action plan is a written plan for managing and treating your asthma attacks. It includes a list of your asthma triggers and how they may be avoided. It also includes information on when medicines should be taken and when their dosage should be changed. An action plan may also involve the use of a device called a peak flow meter. A peak flow meter measures how well the lungs are working. It helps you monitor your condition. HOME CARE INSTRUCTIONS   Take medicines only as directed by your health care provider. Speak with your health care provider if you have questions about how or when to take the medicines.  Use a peak flow meter as directed by your health care provider. Record and keep track of readings.  Understand and use the action plan to help minimize or stop an asthma attack without needing to seek medical care.  Control your home environment in the following ways to help prevent asthma attacks:  Do not smoke. Avoid being exposed to secondhand smoke.  Change your heating and air conditioning filter regularly.  Limit your use of fireplaces and wood stoves.  Get rid of pests (such as roaches   and mice) and their droppings.  Throw away plants if you see mold on them.  Clean your floors and dust regularly. Use unscented cleaning products.  Try to have someone else vacuum for you regularly. Stay out of rooms while they are  being vacuumed and for a short while afterward. If you vacuum, use a dust mask from a hardware store, a double-layered or microfilter vacuum cleaner bag, or a vacuum cleaner with a HEPA filter.  Replace carpet with wood, tile, or vinyl flooring. Carpet can trap dander and dust.  Use allergy-proof pillows, mattress covers, and box spring covers.  Wash bed sheets and blankets every week in hot water and dry them in a dryer.  Use blankets that are made of polyester or cotton.  Clean bathrooms and kitchens with bleach. If possible, have someone repaint the walls in these rooms with mold-resistant paint. Keep out of the rooms that are being cleaned and painted.  Wash hands frequently. SEEK MEDICAL CARE IF:   You have wheezing, shortness of breath, or a cough even if taking medicine to prevent attacks.  The colored mucus you cough up (sputum) is thicker than usual.  Your sputum changes from clear or white to yellow, green, gray, or bloody.  You have any problems that may be related to the medicines you are taking (such as a rash, itching, swelling, or trouble breathing).  You are using a reliever medicine more than 2-3 times per week.  Your peak flow is still at 50-79% of your personal best after following your action plan for 1 hour.  You have a fever. SEEK IMMEDIATE MEDICAL CARE IF:   You seem to be getting worse and are unresponsive to treatment during an asthma attack.  You are short of breath even at rest.  You get short of breath when doing very little physical activity.  You have difficulty eating, drinking, or talking due to asthma symptoms.  You develop chest pain.  You develop a fast heartbeat.  You have a bluish color to your lips or fingernails.  You are light-headed, dizzy, or faint.  Your peak flow is less than 50% of your personal best.   This information is not intended to replace advice given to you by your health care provider. Make sure you discuss any  questions you have with your health care provider.   Document Released: 10/07/2005 Document Revised: 06/28/2015 Document Reviewed: 05/06/2013 Elsevier Interactive Patient Education 2016 [employer] Inc.  

## 2016-02-22 NOTE — Addendum Note (Signed)
Addended by: Mar Daring on: 02/22/2016 12:34 PM   Modules accepted: Orders

## 2016-02-22 NOTE — Progress Notes (Signed)
Patient: Angela Reyes Female    DOB: [birth year]   55 y.o.   MRN: [id number] Visit Date: 02/22/2016  Today's Provider: Mar Daring, PA-C   Chief Complaint  Patient presents with  . Asthma   Subjective:    HPI  Asthma: Patient presents for evaluation of chest tightness, productive cough and wheezing.  The patient has been previously diagnosed with asthma. Symptoms currently include chest tightness, productive cough and wheezing and occur in the past week to get worst.Current limitations in activity from asthma: by the end of the day she feels drained and with walking. Increased porair use to 3-4 times per day and has also had to use her albuterol nebulizer as well.   Does she do nebulizer treatments? yes Does she use an inhaler? yes Does she use a spacer w/MDIs? no Does she monitor peak flow rates? no  What is her personal best peak flow rate: n/a  She does feel that her seasonal allergies have exacerbated her asthma currently as she is having increased frontal sinus pressure, itchy, watery eyes, and sneezing. She is currently using Zyrtec, nasonex and singulair for her allergies. She has previously been seen by Dr. Remus Blake for her allergies, but has not been seen by her in a while.   Of review of her chart she had 2 small lung nodules noted in the right lung for which she was to have re-imaged. This was to have happened in 2015 and never did. Dr. Chase Caller had been her pulmonologist was who had found this and ordered follow up.    No Known Allergies Previous Medications   ALBUTEROL (PROAIR HFA) 108 (90 BASE) MCG/ACT INHALER    Inhale 2 puffs into the lungs every 6 (six) hours as needed for wheezing or shortness of breath.   CETIRIZINE (ZYRTEC) 10 MG TABLET    Take 10 mg by mouth at bedtime. Reported on 10/13/2015   ESOMEPRAZOLE (NEXIUM) 40 MG CAPSULE    TAKE 1 CAPSULE (40 MG TOTAL) BY MOUTH DAILY AT 12 NOON.   FLUTICASONE FUROATE-VILANTEROL (BREO ELLIPTA) 200-25 MCG/INH  AEPB    Inhale 1 puff into the lungs every evening.   MOMETASONE (NASONEX) 50 MCG/ACT NASAL SPRAY    Place 2 sprays into the nose daily.   MONTELUKAST (SINGULAIR) 10 MG TABLET    Take 1 tablet (10 mg total) by mouth at bedtime.   PREDNISONE (DELTASONE) 10 MG TABLET    Take 6 tabs PO on day 1&2, 5 tabs PO on day 3&4, 4 tabs PO on day 5&6, 3 tabs PO on day 7&8, 2 tabs PO on day 9&10, 1 tab PO on day 11&12.    Review of Systems  Constitutional: Positive for activity change. Negative for appetite change.  HENT: Positive for postnasal drip, sinus pressure and sneezing. Negative for ear pain, rhinorrhea and sore throat.   Eyes: Positive for discharge (watery) and itching.  Respiratory: Positive for cough, chest tightness, shortness of breath and wheezing.   Cardiovascular: Negative for chest pain, palpitations and leg swelling.  Gastrointestinal: Negative for nausea, vomiting and abdominal pain.  Neurological: Negative for dizziness and headaches.    Social History  Substance Use Topics  . Smoking status: Never Smoker   . Smokeless tobacco: Never Used  . Alcohol Use: No   Objective:   BP 130/82 mmHg  Pulse 80  Temp(Src) 98 F (36.7 C) (Oral)  Resp 16  Wt 189 lb 12.8 oz (86.093 kg)  SpO2  95%  LMP 02/10/2015  Physical Exam  Constitutional: She appears well-developed and well-nourished. No distress.  HENT:  Head: Normocephalic and atraumatic.  Right Ear: Hearing, external ear and ear canal normal. Tympanic membrane is not erythematous and not bulging. A middle ear effusion is present.  Left Ear: Hearing, external ear and ear canal normal. Tympanic membrane is not erythematous and not bulging. A middle ear effusion is present.  Nose: No mucosal edema or rhinorrhea. Right sinus exhibits frontal sinus tenderness. Right sinus exhibits no maxillary sinus tenderness. Left sinus exhibits frontal sinus tenderness. Left sinus exhibits no maxillary sinus tenderness.  Mouth/Throat: Uvula is  midline, oropharynx is clear and moist and mucous membranes are normal. No oropharyngeal exudate, posterior oropharyngeal edema or posterior oropharyngeal erythema.  Eyes: Conjunctivae are normal. Pupils are equal, round, and reactive to light. Right eye exhibits no discharge. Left eye exhibits no discharge. No scleral icterus.  Neck: Normal range of motion. Neck supple. No tracheal deviation present. No thyromegaly present.  Cardiovascular: Normal rate, regular rhythm and normal heart sounds.  Exam reveals no gallop and no friction rub.   No murmur heard. Pulmonary/Chest: Effort normal. No stridor. No respiratory distress. She has no decreased breath sounds. She has wheezes (more faint in mid lung fields) in the right upper field, the right middle field, the left upper field and the left middle field. She has no rhonchi. She has no rales.  Lymphadenopathy:    She has no cervical adenopathy.  Skin: Skin is warm and dry. She is not diaphoretic.  Vitals reviewed.       Assessment & Plan:     1. Asthma, severe persistent, with acute exacerbation Worsening symptoms with increased rescue inhaler and nebulizer use. Will give oral prednisone and z-pak as below. She is to stay well hydrated and get plenty of rest. Call if symptoms fail to improve or worsen. - predniSONE (DELTASONE) 10 MG tablet; Take 6 tabs PO on day 1&2, 5 tabs PO on day 3&4, 4 tabs PO on day 5&6, 3 tabs PO on day 7&8, 2 tabs PO on day 9&10, 1 tab PO on day 11&12.  Dispense: 42 tablet; Refill: 0 - azithromycin (ZITHROMAX) 250 MG tablet; Take 2 tablets PO on day one, and one tablet PO daily thereafter until completed.  Dispense: 6 tablet; Refill: 0  2. Seasonal allergies Continue singulair, zyrtrec and nasonex.  3. Lung nodule 59mm and 27mm lung nodule noted on high resolution CT from 2014. Was to have had follow up in 2015. It had been ordered but was never scheduled per patient. Will attempt to get follow up CT to make sure no  growth or changes. - CT CHEST NODULE FOLLOW UP LOW DOSE W/O; Future       Mar Daring, PA-C  [facility]

## 2016-02-28 ENCOUNTER — Ambulatory Visit: Payer: BLUE CROSS/BLUE SHIELD

## 2016-03-08 ENCOUNTER — Other Ambulatory Visit: Payer: Self-pay | Admitting: Family Medicine

## 2016-03-08 DIAGNOSIS — J4551 Severe persistent asthma with (acute) exacerbation: Secondary | ICD-10-CM

## 2016-03-08 MED ORDER — PREDNISONE 10 MG PO TABS: ORAL_TABLET | ORAL | Status: DC

## 2016-03-08 NOTE — Telephone Encounter (Signed)
Patient advised. She states that she thought you told her if she is not 100% after the first round to call back and request another refill without been seen. She states she feels better just not all the way, I told her i would double check with you and then will let her know for sure.-aa

## 2016-03-08 NOTE — Addendum Note (Signed)
Addended by: Mar Daring on: 03/08/2016 03:06 PM   Modules accepted: Orders

## 2016-03-08 NOTE — Telephone Encounter (Signed)
Pt advised, pt [place]

## 2016-03-08 NOTE — Telephone Encounter (Signed)
Pt contacted office for refill request on the following medications:  predniSONE (DELTASONE) 10 MG tablet.  [facility].  CB#[phone]/MW

## 2016-03-08 NOTE — Telephone Encounter (Signed)
[id number] 

## 2016-03-08 NOTE — Telephone Encounter (Signed)
Will give a refill and if still no improvement she needs to be seen.

## 2016-03-08 NOTE — Telephone Encounter (Signed)
Patient was here for asthma flare up on 05/4

## 2016-03-26 ENCOUNTER — Ambulatory Visit
Admission: RE | Admit: 2016-03-26 | Discharge: 2016-03-26 | Disposition: A | Discharge status: Home / Self Care | Payer: BLUE CROSS/BLUE SHIELD | Source: Ambulatory Visit | Attending: Physician Assistant | Admitting: Physician Assistant

## 2016-03-26 DIAGNOSIS — R911 Solitary pulmonary nodule: Secondary | ICD-10-CM | POA: Insufficient documentation

## 2016-03-26 DIAGNOSIS — R918 Other nonspecific abnormal finding of lung field: Secondary | ICD-10-CM | POA: Insufficient documentation

## 2016-03-27 ENCOUNTER — Other Ambulatory Visit: Payer: Self-pay | Admitting: Physician Assistant

## 2016-03-27 ENCOUNTER — Other Ambulatory Visit: Payer: Self-pay

## 2016-03-27 DIAGNOSIS — J4551 Severe persistent asthma with (acute) exacerbation: Secondary | ICD-10-CM

## 2016-03-27 DIAGNOSIS — R918 Other nonspecific abnormal finding of lung field: Secondary | ICD-10-CM

## 2016-03-27 MED ORDER — ALBUTEROL SULFATE (2.5 MG/3ML) 0.083% IN NEBU: 2.5000 mg | INHALATION_SOLUTION | Freq: Four times a day (QID) | RESPIRATORY_TRACT | Status: DC | PRN

## 2016-03-28 ENCOUNTER — Encounter: Payer: Self-pay | Admitting: Internal Medicine

## 2016-04-08 ENCOUNTER — Ambulatory Visit (INDEPENDENT_AMBULATORY_CARE_PROVIDER_SITE_OTHER): Payer: BLUE CROSS/BLUE SHIELD | Admitting: Internal Medicine

## 2016-04-08 ENCOUNTER — Encounter: Payer: Self-pay | Admitting: Internal Medicine

## 2016-04-08 ENCOUNTER — Encounter (INDEPENDENT_AMBULATORY_CARE_PROVIDER_SITE_OTHER): Payer: Self-pay

## 2016-04-08 VITALS — BP 122/74 | HR 94 | Ht 69.5 in | Wt 190.0 lb

## 2016-04-08 DIAGNOSIS — J4540 Moderate persistent asthma, uncomplicated: Secondary | ICD-10-CM | POA: Diagnosis not present

## 2016-04-08 NOTE — Progress Notes (Signed)
[facility]     Assessment and Plan:  55 year old female with moderately severe asthma, and lung nodules.  Asthma. -He appears recovering from her recent exacerbation. -We'll continue Breo, Singulair. -Recommend that she remove her dog from the bedroom. -Continue using nasal steroid for chronic rhinitis. -Continue using Nexium for GERD. -We'll have her follow-up in 6 months time.  Lung nodules. -There are numerous stable nodule seen on 03/26/16, minimally changed since previous exam on 09/14/13, the chances of any of these out as being malignant is very low. -Lingular nodule, seen in early form on 09/14/13, now appears to be more prominent. This appears to have a slight tail and thus represent an area of rounded atelectasis. We will continue to monitor. -CT chest without contrast in one year.  Sleep Apnea.  -Remote history of obstructive sleep apnea, the patient was intolerant of CPAP, and therefore stopped using it. Continues to have snoring.  Date: 04/08/2016  MRN# [id number] Angela Reyes [birth year]  Referring Physician:   CLEA CAINE is a 55 y.o. old female seen in consultation for chief complaint of:    Chief Complaint  Patient presents with  . Follow-up    pt ref by Fenton Malling. former RM pt last seen in 2015. last CT 03/26/16. pt c/o occ sob,occ wheezing, prod cough clear in colorX2-3d. denies cp/tightness     HPI:   The patient is a 55 year old female with asthma, recently had poor control of her asthma with exacerbation. She was seen by her primary care physician and was given a course of oral prednisone and Z-Pak. She was also noted to have lung nodules in 2014, she was to follow-up with Dr. Chase Caller. Review of low-dose CT chest images from 03/26/16, comparison with previous from 09/14/13. There are numerous tiny unchanged lung nodules including the right upper lobe nodule, which the radiologist erroneously states is new. There was an early  groundglass change in the left lingula, this is now become a nodular opacity which may represent rounded atelectasis.  She has just completed a course of prednisone. She has been using breo recently which has helped. She has been using her nebs once in the past week. She has no recent night time symptoms. She has used her rescue inhaler about 4 times in past week.  She takes zyrtec and singulair daily, which she thinks helps. She has reflux controlled by nexium.  She has had allergy testing int he past, and was negative. Has a dog that is in the bedroom but not in the bed.   Her last flare up that required prednisone was 9 months ago.  She has a diagnosis of OSA, she used it for about a year but then stopped because she could not adjust to it.   PMHX:   Past Medical History  Diagnosis Date  . GERD (gastroesophageal reflux disease)   . Asthma   . Right ureteral stone   . Chronic rhinosinusitis   . History of gestational diabetes   . Pulmonary nodule, right     middle lobe-- stable low risk--  pulmologist,  dr Chase Caller  . History of hiatal hernia   . OSA (obstructive sleep apnea)     non-compliant cpap   Surgical Hx:  Past Surgical History  Procedure Laterality Date  . Extracorporeal shock wave lithotripsy Right 05-15-2015  . Tonsillectomy  age 39  . Wisdom tooth extraction  age 31  . Cystoscopy w/ ureteral stent placement Right 06/07/2015    Procedure:  CYSTOSCOPY WITH RETROGRADE PYELOGRAM/ STONE EXTRACTION AND URETERAL STENT PLACEMENT;  Surgeon: Cleon Gustin, MD;  Location: [facility];  Service: Urology;  Laterality: Right;  . Holmium laser application Right [id number]    Procedure: HOLMIUM LASER APPLICATION;  Surgeon: Cleon Gustin, MD;  Location[facility];  Service: Urology;  Laterality: Right;   Family Hx:  Family History  Problem Relation Age of Onset  . Allergies      siblings  . Hypertension Mother   . Hypertension Father   .  Kidney disease Brother   . Hypertension Maternal Grandmother   . Cancer Paternal Grandfather    Social Hx:   Social History  Substance Use Topics  . Smoking status: Never Smoker   . Smokeless tobacco: Never Used  . Alcohol Use: No   Medication:   Current Outpatient Rx  Name  Route  Sig  Dispense  Refill  . albuterol (PROAIR HFA) 108 (90 Base) MCG/ACT inhaler   Inhalation   Inhale 2 puffs into the lungs every 6 (six) hours as needed for wheezing or shortness of breath.   1 Inhaler   5   . albuterol (PROVENTIL) (2.5 MG/3ML) 0.083% nebulizer solution   Nebulization   Take 3 mLs (2.5 mg total) by nebulization every 6 (six) hours as needed for wheezing or shortness of breath.   75 mL   1   . azithromycin (ZITHROMAX) 250 MG tablet      Take 2 tablets PO on day one, and one tablet PO daily thereafter until completed.   6 tablet   0   . cetirizine (ZYRTEC) 10 MG tablet   Oral   Take 10 mg by mouth at bedtime. Reported on 10/13/2015         . esomeprazole (NEXIUM) 40 MG capsule      TAKE 1 CAPSULE (40 MG TOTAL) BY MOUTH DAILY AT 12 NOON.      5   . fluticasone furoate-vilanterol (BREO ELLIPTA) 200-25 MCG/INH AEPB   Inhalation   Inhale 1 puff into the lungs every evening.   60 each   3   . mometasone (NASONEX) 50 MCG/ACT nasal spray   Nasal   Place 2 sprays into the nose daily. Patient taking differently: Place 2 sprays into the nose daily as needed.    51 g   5   . montelukast (SINGULAIR) 10 MG tablet   Oral   Take 1 tablet (10 mg total) by mouth at bedtime.   90 tablet   3   . predniSONE (DELTASONE) 10 MG tablet      Take 6 tabs PO on day 1&2, 5 tabs PO on day 3&4, 4 tabs PO on day 5&6, 3 tabs PO on day 7&8, 2 tabs PO on day 9&10, 1 tab PO on day 11&12.   42 tablet   0       Allergies:  Review of patient's allergies indicates no known allergies.  Review of Systems: Gen:  Denies  fever, sweats, chills HEENT: Denies blurred vision, double  vision. Cvc:  No dizziness, chest pain. Resp:   Denies cough or sputum production, Gi: Denies swallowing difficulty, stomach pain. Gu:  Denies bladder incontinence, burning urine Ext:   No Joint pain, stiffness. Skin: No skin rash,  hives  Endoc:  No polyuria, polydipsia. Psych: No depression, insomnia. Other:  All other systems were reviewed with the patient and were negative other that what is mentioned in the HPI.  Physical Examination:   VS: BP 122/74 mmHg  Pulse 94  Ht 5' 9.5" (1.765 m)  Wt 190 lb (86.183 kg)  BMI 27.67 kg/m2  SpO2 95%  LMP 02/10/2015  General Appearance: No distress  Neuro:without focal findings,  speech normal,  HEENT: PERRLA, EOM intact.   Pulmonary: normal breath sounds, No wheezing.  CardiovascularNormal S1,S2.  No m/r/g.   Abdomen: Benign, Soft, non-tender. Renal:  No costovertebral tenderness  GU:  No performed at this time. Endoc: No evident thyromegaly, no signs of acromegaly. Skin:   warm, no rashes, no ecchymosis  Extremities: normal, no cyanosis, clubbing.  Other findings:    LABORATORY PANEL:   CBC No results for input(s): WBC, HGB, HCT, PLT in the last 168 hours. ------------------------------------------------------------------------------------------------------------------  Chemistries  No results for input(s): NA, K, CL, CO2, GLUCOSE, BUN, CREATININE, CALCIUM, MG, AST, ALT, ALKPHOS, BILITOT in the last 168 hours.  Invalid input(s): GFRCGP ------------------------------------------------------------------------------------------------------------------  Cardiac Enzymes No results for input(s): TROPONINI in the last 168 hours. ------------------------------------------------------------  RADIOLOGY:  No results found.     Thank  you for the consultation and for allowing [facility] to assist in the care of your patient. Our recommendations are noted above.  Please contact us if we can be of  further service.   Marda Stalker, MD.  Board Certified in Internal Medicine, Pulmonary Medicine, [facility], and Sleep Medicine.  [facility] Office Number: [phone]  Patricia Pesa, M.D.  Vilinda Boehringer, M.D.  Merton Border, M.D  04/08/2016

## 2016-04-08 NOTE — Patient Instructions (Addendum)
Continue advair and singulair.   Recommend that you remove pets from the bedroom.   CT chest without contrast in 1 year.

## 2016-04-18 ENCOUNTER — Ambulatory Visit (INDEPENDENT_AMBULATORY_CARE_PROVIDER_SITE_OTHER): Payer: BLUE CROSS/BLUE SHIELD | Admitting: Physician Assistant

## 2016-04-18 ENCOUNTER — Encounter: Payer: Self-pay | Admitting: Physician Assistant

## 2016-04-18 VITALS — BP 148/90 | HR 79 | Temp 97.9°F | Resp 16 | Wt 191.0 lb

## 2016-04-18 DIAGNOSIS — J0141 Acute recurrent pansinusitis: Secondary | ICD-10-CM | POA: Diagnosis not present

## 2016-04-18 MED ORDER — DOXYCYCLINE HYCLATE 100 MG PO TABS: 100.0000 mg | ORAL_TABLET | Freq: Two times a day (BID) | ORAL | Status: DC

## 2016-04-18 NOTE — Progress Notes (Signed)
Patient: Angela Reyes Female    DOB: [birth year]   55 y.o.   MRN: [id number] Visit Date: 04/18/2016  Today's Provider: Mar Daring, PA-C   Chief Complaint  Patient presents with  . Sinusitis   Subjective:    Sinusitis The current episode started 1 to 4 weeks ago. The problem has been gradually worsening since onset. There has been no fever. She is experiencing no pain. Associated symptoms include congestion, coughing, ear pain (Right ear), headaches, a hoarse voice, shortness of breath, sinus pressure, sneezing and swollen glands. Pertinent negatives include no chills or sore throat. Past treatments include antibiotics, oral decongestants, spray decongestants and acetaminophen (She had some of the amox left and took some on Friday last week.). The treatment provided no relief.      No Known Allergies Current Meds  Medication Sig  . albuterol (PROAIR HFA) 108 (90 Base) MCG/ACT inhaler Inhale 2 puffs into the lungs every 6 (six) hours as needed for wheezing or shortness of breath.  Marland Kitchen albuterol (PROVENTIL) (2.5 MG/3ML) 0.083% nebulizer solution Take 3 mLs (2.5 mg total) by nebulization every 6 (six) hours as needed for wheezing or shortness of breath.  . cetirizine (ZYRTEC) 10 MG tablet Take 10 mg by mouth at bedtime. Reported on 10/13/2015  . esomeprazole (NEXIUM) 40 MG capsule TAKE 1 CAPSULE (40 MG TOTAL) BY MOUTH DAILY AT 12 NOON.  . fluticasone furoate-vilanterol (BREO ELLIPTA) 200-25 MCG/INH AEPB Inhale 1 puff into the lungs every evening.  . mometasone (NASONEX) 50 MCG/ACT nasal spray Place 2 sprays into the nose daily. (Patient taking differently: Place 2 sprays into the nose daily as needed. )  . montelukast (SINGULAIR) 10 MG tablet Take 1 tablet (10 mg total) by mouth at bedtime.    Review of Systems  Constitutional: Positive for fatigue. Negative for fever and chills.  HENT: Positive for congestion, ear pain (Right ear), hoarse voice, postnasal drip,  rhinorrhea, sinus pressure and sneezing. Negative for sore throat and trouble swallowing.   Respiratory: Positive for cough, chest tightness, shortness of breath and wheezing.   Cardiovascular: Negative for chest pain, palpitations and leg swelling.  Gastrointestinal: Negative for nausea, vomiting and abdominal pain.  Neurological: Positive for headaches. Negative for dizziness.    Social History  Substance Use Topics  . Smoking status: Never Smoker   . Smokeless tobacco: Never Used  . Alcohol Use: No   Objective:   BP 148/90 mmHg  Pulse 79  Temp(Src) 97.9 F (36.6 C) (Oral)  Resp 16  Wt 191 lb (86.637 kg)  SpO2 93%  LMP 02/10/2015  Physical Exam  Constitutional: She appears well-developed and well-nourished. She appears ill. No distress.  HENT:  Head: Normocephalic and atraumatic.  Right Ear: Hearing, external ear and ear canal normal. Tympanic membrane is not erythematous and not bulging. A middle ear effusion is present.  Left Ear: Hearing, external ear and ear canal normal. Tympanic membrane is not erythematous and not bulging. A middle ear effusion is present.  Nose: Mucosal edema and rhinorrhea present. Right sinus exhibits maxillary sinus tenderness and frontal sinus tenderness. Left sinus exhibits maxillary sinus tenderness and frontal sinus tenderness.  Mouth/Throat: Uvula is midline, oropharynx is clear and moist and mucous membranes are normal. No oropharyngeal exudate, posterior oropharyngeal edema or posterior oropharyngeal erythema.  Neck: Normal range of motion. Neck supple. No tracheal deviation present. No thyromegaly present.  Cardiovascular: Normal rate, regular rhythm and normal heart sounds.  Exam reveals no gallop  and no friction rub.   No murmur heard. Pulmonary/Chest: Effort normal. No stridor. No respiratory distress. She has wheezes in the right lower field and the left lower field. She has no rales.  Lymphadenopathy:    She has no cervical adenopathy.    Skin: She is not diaphoretic.  Vitals reviewed.     Assessment & Plan:     1. Acute recurrent pansinusitis Will treat with doxycycline as below. Continue inhalers and nebulizer as needed. Stay well hydrated and try to get plenty of rest. Advised to avoid direct sun with doxycycline to avoid burn. Call the office if symptoms fail to improve or worsen. - doxycycline (VIBRA-TABS) 100 MG tablet; Take 1 tablet (100 mg total) by mouth 2 (two) times daily.  Dispense: 20 tablet; Refill: 0  The entirety of the information documented in the History of Present Illness, Review of Systems and Physical Exam were personally obtained by me. Portions of this information were initially documented by Lyndel Pleasure, CMA and reviewed by me for thoroughness and accuracy.      Mar Daring, PA-C  [facility]

## 2016-04-18 NOTE — Patient Instructions (Signed)

## 2016-04-26 DIAGNOSIS — N1330 Unspecified hydronephrosis: Secondary | ICD-10-CM | POA: Diagnosis not present

## 2016-05-01 ENCOUNTER — Encounter: Payer: Self-pay | Admitting: Physician Assistant

## 2016-05-01 ENCOUNTER — Ambulatory Visit (INDEPENDENT_AMBULATORY_CARE_PROVIDER_SITE_OTHER): Payer: BLUE CROSS/BLUE SHIELD | Admitting: Physician Assistant

## 2016-05-01 ENCOUNTER — Telehealth: Payer: Self-pay | Admitting: Emergency Medicine

## 2016-05-01 VITALS — BP 132/80 | HR 88 | Temp 98.1°F | Resp 20 | Wt 188.0 lb

## 2016-05-01 DIAGNOSIS — R059 Cough, unspecified: Secondary | ICD-10-CM

## 2016-05-01 DIAGNOSIS — R05 Cough: Secondary | ICD-10-CM | POA: Diagnosis not present

## 2016-05-01 DIAGNOSIS — J40 Bronchitis, not specified as acute or chronic: Secondary | ICD-10-CM

## 2016-05-01 MED ORDER — HYDROCOD POLST-CPM POLST ER 10-8 MG/5ML PO SUER: 5.0000 mL | Freq: Two times a day (BID) | ORAL | Status: DC | PRN

## 2016-05-01 MED ORDER — LEVOFLOXACIN 750 MG PO TABS: 750.0000 mg | ORAL_TABLET | Freq: Every day | ORAL | Status: DC

## 2016-05-01 MED ORDER — PREDNISONE 10 MG PO TABS: ORAL_TABLET | ORAL | Status: DC

## 2016-05-01 NOTE — Patient Instructions (Signed)
Levofloxacin tablets °What is this medicine? °LEVOFLOXACIN (lee voe FLOX a sin) is a quinolone antibiotic. It is used to treat certain kinds of bacterial infections. It will not work for colds, flu, or other viral infections. °This medicine may be used for other purposes; ask your health care provider or pharmacist if you have questions. °What should I tell my health care provider before I take this medicine? °They need to know if you have any of these conditions: °-bone problems °-cerebral disease °-history of low levels of potassium in the blood °-irregular heartbeat °-joint problems °-kidney disease °-myasthenia gravis °-seizures °-tendon problems °-tingling of the fingers or toes, or other nerve disorder °-an unusual or allergic reaction to levofloxacin, other quinolone antibiotics, foods, dyes, or preservatives °-pregnant or trying to get pregnant °-breast-feeding °How should I use this medicine? °Take this medicine by mouth with a full glass of water. Follow the directions on the prescription label. This medicine can be taken with or without food. Take your medicine at regular intervals. Do not take your medicine more often than directed. Do not skip doses or stop your medicine early even if you feel better. Do not stop taking except on your doctor's advice. °A special MedGuide will be given to you by the pharmacist with each prescription and refill. Be sure to read this information carefully each time. °Talk to your pediatrician regarding the use of this medicine in children. While this drug may be prescribed for children as young as 6 months for selected conditions, precautions do apply. °Overdosage: If you think you have taken too much of this medicine contact a poison control center or emergency room at once. °NOTE: This medicine is only for you. Do not share this medicine with others. °What if I miss a dose? °If you miss a dose, take it as soon as you remember. If it is almost time for your next dose,  take only that dose. Do not take double or extra doses. °What may interact with this medicine? °Do not take this medicine with any of the following medications: °-arsenic trioxide °-chloroquine °-droperidol °-medicines for irregular heart rhythm like amiodarone, disopyramide, dofetilide, flecainide, quinidine, procainamide, sotalol °-some medicines for depression or mental problems like phenothiazines, pimozide, and ziprasidone °This medicine may also interact with the following medications: °-amoxapine °-antacids °-birth control pills °-cisapride °-dairy products °-didanosine (ddI) buffered tablets or powder °-haloperidol °-multivitamins °-NSAIDS, medicines for pain and inflammation, like ibuprofen or naproxen °-retinoid products like tretinoin or isotretinoin °-risperidone °-some other antibiotics like clarithromycin or erythromycin °-sucralfate °-theophylline °-warfarin °This list may not describe all possible interactions. Give your health care provider a list of all the medicines, herbs, non-prescription drugs, or dietary supplements you use. Also tell them if you smoke, drink alcohol, or use illegal drugs. Some items may interact with your medicine. °What should I watch for while using this medicine? °Tell your doctor or health care professional if your symptoms do not improve or if they get worse. Drink several glasses of water a day and cut down on drinks that contain caffeine. You must not get dehydrated while taking this medicine. °You may get drowsy or dizzy. Do not drive, use machinery, or do anything that needs mental alertness until you know how this medicine affects you. Do not sit or stand up quickly, especially if you are an older patient. This reduces the risk of dizzy or fainting spells. °This medicine can make you more sensitive to the sun. Keep out of the sun. If you cannot avoid   being in the sun, wear protective clothing and use a sunscreen. Do not use sun lamps or tanning beds/booths. Contact  your doctor if you get a sunburn. °If you are a diabetic monitor your blood glucose carefully. If you get an unusual reading stop taking this medicine and call your doctor right away. °Do not treat diarrhea with over-the-counter products. Contact your doctor if you have diarrhea that lasts more than 2 days or if the diarrhea is severe and watery. °Avoid antacids, calcium, iron, and zinc products for 2 hours before and 2 hours after taking a dose of this medicine. °What side effects may I notice from receiving this medicine? °Side effects that you should report to your doctor or health care professional as soon as possible: °-allergic reactions like skin rash or hives, swelling of the face, lips, or tongue °-anxious °-confusion °-depressed mood °-diarrhea °-fast, irregular heartbeat °-hallucination, loss of contact with reality °-joint, muscle, or tendon pain or swelling °-pain, tingling, numbness in the hands or feet °-suicidal thoughts or other mood changes °-sunburn °-unusually weak or tired °Side effects that usually do not require medical attention (report to your doctor or health care professional if they continue or are bothersome): °-dry mouth °-headache °-nausea °-trouble sleeping °This list may not describe all possible side effects. Call your doctor for medical advice about side effects. You may report side effects to FDA at 1-800-FDA-1088. °Where should I keep my medicine? °Keep out of the reach of children. °Store at room temperature between 15 and 30 degrees C (59 and 86 degrees F). Keep in a tightly closed container. Throw away any unused medicine after the expiration date. °NOTE: This sheet is a summary. It may not cover all possible information. If you have questions about this medicine, talk to your doctor, pharmacist, or health care provider. °  °© 2016, [employer]. (2015-05-18 12:40:18) ° °

## 2016-05-01 NOTE — Telephone Encounter (Signed)
Pt was seen on 04/18/16 for sinusitis and treated with Doxy. She called to tell you that she is not feeling any better and is now wheezing and having trouble breathing. She reports that she was told to call if she was not better. Please advise. Thanks.   [facility]

## 2016-05-01 NOTE — Telephone Encounter (Signed)
Patient scheduled for an appointment today.   Thanks,  -Joseline

## 2016-05-01 NOTE — Progress Notes (Signed)
Patient: Angela Reyes Female    DOB: 06-May-1961   55 y.o.   MRN: [id number] Visit Date: 05/01/2016  Today's Provider: Mar Daring, PA-C   Chief Complaint  Patient presents with  . Cough   Subjective:    HPI Patient comes in today c/o cough. She was seen in the office on 04/18/16 with similar symptoms. Patient reports that her cough has gotten worse, and she is now starting to wheeze. Patient reports that she has had to use her rescue inhaler at least 1-2 times daily along with her nebulizer. Patient denies any fever. She does have sinus pressure and plugged ear sensation. On last OV, patient was treated with Doxycycline which she completed today.     No Known Allergies No outpatient prescriptions have been marked as taking for the 05/01/16 encounter (Office Visit) with Mar Daring, PA-C.    Review of Systems  Constitutional: Positive for fatigue. Negative for fever and chills.  HENT: Positive for congestion, ear pain, postnasal drip, rhinorrhea, sinus pressure, sneezing and voice change.   Respiratory: Positive for cough, shortness of breath and wheezing.        Mild wheezing. More in the afternoon.   Cardiovascular: Negative.   Gastrointestinal: Positive for nausea. Negative for vomiting and abdominal pain.  Neurological: Positive for headaches. Negative for dizziness.    Social History  Substance Use Topics  . Smoking status: Never Smoker   . Smokeless tobacco: Never Used  . Alcohol Use: No   Objective:   LMP 02/10/2015  Physical Exam  Constitutional: She appears well-developed and well-nourished. No distress.  HENT:  Head: Normocephalic and atraumatic.  Right Ear: Hearing, external ear and ear canal normal. Tympanic membrane is not erythematous and not bulging. A middle ear effusion is present.  Left Ear: Hearing, external ear and ear canal normal. Tympanic membrane is not erythematous and not bulging. A middle ear effusion is present.  Nose:  Mucosal edema and rhinorrhea present. Right sinus exhibits frontal sinus tenderness. Right sinus exhibits no maxillary sinus tenderness. Left sinus exhibits frontal sinus tenderness. Left sinus exhibits no maxillary sinus tenderness.  Mouth/Throat: Uvula is midline, oropharynx is clear and moist and mucous membranes are normal. No oropharyngeal exudate, posterior oropharyngeal edema or posterior oropharyngeal erythema.  Ethmoid tenderness as well  Neck: Normal range of motion. Neck supple. No tracheal deviation present. No thyromegaly present.  Cardiovascular: Normal rate, regular rhythm and normal heart sounds.  Exam reveals no gallop and no friction rub.   No murmur heard. Pulmonary/Chest: Effort normal. No stridor. No respiratory distress. She has wheezes (throughout). She has rales (throughout).  Lymphadenopathy:    She has no cervical adenopathy.  Skin: She is not diaphoretic.  Vitals reviewed.     Assessment & Plan:     1. Bronchitis Worsening symptoms. Will treat with levaquin and oral prednisone as below. She may use nebulizer if needed for severe SOB. If symptoms worsen or fail to improve we will get a CXR and PFT if not done by her pulmonologist. Tussionex given for cough suppression. She is to call if no improvement. - levofloxacin (LEVAQUIN) 750 MG tablet; Take 1 tablet (750 mg total) by mouth daily.  Dispense: 10 tablet; Refill: 0 - predniSONE (DELTASONE) 10 MG tablet; Take 6 tabs PO on day 1&2, 5 tabs PO on day 3&4, 4 tabs PO on day 5&6, 3 tabs PO on day 7&8, 2 tabs PO on day 9&10, 1 tab PO on  day 11&12.  Dispense: 42 tablet; Refill: 0  2. Cough See above medical treatment plan. - chlorpheniramine-HYDROcodone (TUSSIONEX PENNKINETIC ER) 10-8 MG/5ML SUER; Take 5 mLs by mouth every 12 (twelve) hours as needed for cough.  Dispense: 140 mL; Refill: 0       Mar Daring, PA-C  [facility]

## 2016-05-02 ENCOUNTER — Other Ambulatory Visit: Payer: Self-pay | Admitting: Physician Assistant

## 2016-05-02 ENCOUNTER — Ambulatory Visit: Payer: Self-pay | Admitting: Physician Assistant

## 2016-05-02 DIAGNOSIS — K219 Gastro-esophageal reflux disease without esophagitis: Secondary | ICD-10-CM

## 2016-05-02 MED ORDER — ESOMEPRAZOLE MAGNESIUM 40 MG PO CPDR: DELAYED_RELEASE_CAPSULE | ORAL | Status: DC

## 2016-06-10 ENCOUNTER — Ambulatory Visit (INDEPENDENT_AMBULATORY_CARE_PROVIDER_SITE_OTHER): Payer: BLUE CROSS/BLUE SHIELD | Admitting: Family Medicine

## 2016-06-10 ENCOUNTER — Encounter: Payer: Self-pay | Admitting: Family Medicine

## 2016-06-10 VITALS — BP 130/78 | HR 103 | Temp 97.8°F | Resp 20 | Wt 191.0 lb

## 2016-06-10 DIAGNOSIS — J45901 Unspecified asthma with (acute) exacerbation: Secondary | ICD-10-CM | POA: Diagnosis not present

## 2016-06-10 DIAGNOSIS — J0100 Acute maxillary sinusitis, unspecified: Secondary | ICD-10-CM | POA: Diagnosis not present

## 2016-06-10 MED ORDER — PREDNISONE 20 MG PO TABS: ORAL_TABLET | ORAL | 0 refills | Status: DC

## 2016-06-10 MED ORDER — AMOXICILLIN-POT CLAVULANATE 875-125 MG PO TABS: 1.0000 | ORAL_TABLET | Freq: Two times a day (BID) | ORAL | 0 refills | Status: DC

## 2016-06-10 NOTE — Patient Instructions (Signed)
Continue to schedule Proair every 4-6 hours.

## 2016-06-10 NOTE — Progress Notes (Signed)
Subjective:     Patient ID: Angela Reyes, female   DOB: 1961-06-11, 55 y.o.   MRN: [id number]  HPI  Chief Complaint  Patient presents with  . Asthma    Exacerbation. Pt reports she has asthma exacerbations yearly from July to September. Has tried neb treatments, rescue inhalers. Pt takes Zyrtec, singulair, Breo Ellipta and Nasonex daily.  States she developed purulent sinus congestion with increased sinus pressure > one week ago. Now states her asthma has also flared despite using her albuterol 4 x day. Reports she has removed her dog from her bedroom as recommended by her pulmonary doctor.   Review of Systems     Objective:   Physical Exam  Constitutional: She appears well-developed and well-nourished. No distress.  Ears: T.M's intact without inflammation Sinuses: mild frontal and maxillary sinus tenderness Throat: no tonsillar enlargement or exudate Neck: no cervical adenopathy Lungs: inspiratory and expiratory wheezes in bilateral posterior lung fields.     Assessment:    1. Acute maxillary sinusitis, recurrence not specified - amoxicillin-clavulanate (AUGMENTIN) 875-125 MG tablet; Take 1 tablet by mouth 2 (two) times daily.  Dispense: 20 tablet; Refill: 0  2. Asthma exacerbation - predniSONE (DELTASONE) 20 MG tablet; Taper as follows: 3 pills for 4 days, two pills for 4 days, one pill for four days  Dispense: 24 tablet; Refill: 0    Plan:    Continue scheduled albuterol.

## 2016-07-19 ENCOUNTER — Other Ambulatory Visit: Payer: Self-pay

## 2016-07-19 DIAGNOSIS — J4551 Severe persistent asthma with (acute) exacerbation: Secondary | ICD-10-CM

## 2016-07-19 MED ORDER — FLUTICASONE FUROATE-VILANTEROL 200-25 MCG/INH IN AEPB: 1.0000 | INHALATION_SPRAY | Freq: Every evening | RESPIRATORY_TRACT | 1 refills | Status: DC

## 2016-07-23 ENCOUNTER — Telehealth: Payer: Self-pay

## 2016-07-23 DIAGNOSIS — J4540 Moderate persistent asthma, uncomplicated: Secondary | ICD-10-CM

## 2016-07-23 MED ORDER — FLUTICASONE FUROATE-VILANTEROL 200-25 MCG/INH IN AEPB: 1.0000 | INHALATION_SPRAY | Freq: Every evening | RESPIRATORY_TRACT | 1 refills | Status: DC

## 2016-07-23 NOTE — Telephone Encounter (Signed)
Changed to 90 day

## 2016-07-23 NOTE — Telephone Encounter (Signed)
[facility]  90 days supply for BREO ELLIPTA.  Thanks,  -Jayce Boyko

## 2016-08-14 IMAGING — CT CT CHEST W/O CM
1 series · 15 of 34 positions shown, 19 images · non-contrast
Comparison: None.

CLINICAL DATA: Follow-up to chest CT from 09/14/2013

EXAM:
CT CHEST WITHOUT CONTRAST
TECHNIQUE: Multidetector CT imaging of the chest was performed following the
standard protocol without IV contrast.

[Series 2: thorax · axial · 0.71mm/px · z∈[-718,-428]mm · 15 of 171 slices shown, 19 images]
[im 13/171  mediastinal]
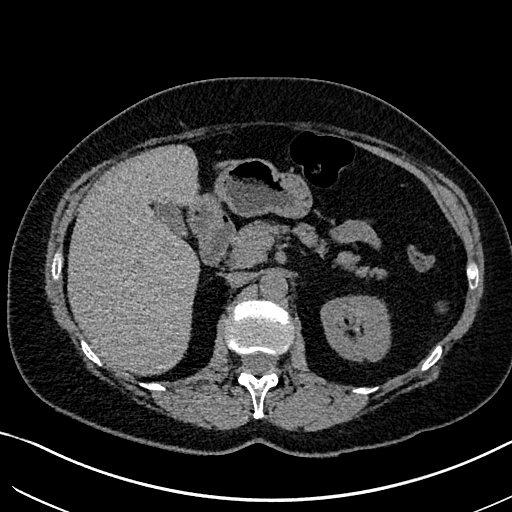
[im 13/171  lung]
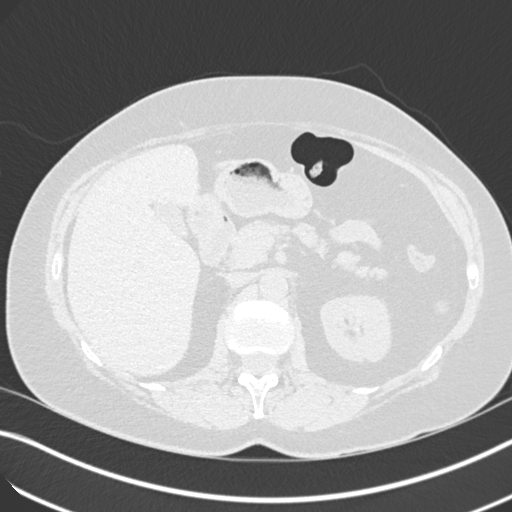
[im 26/171  lung]
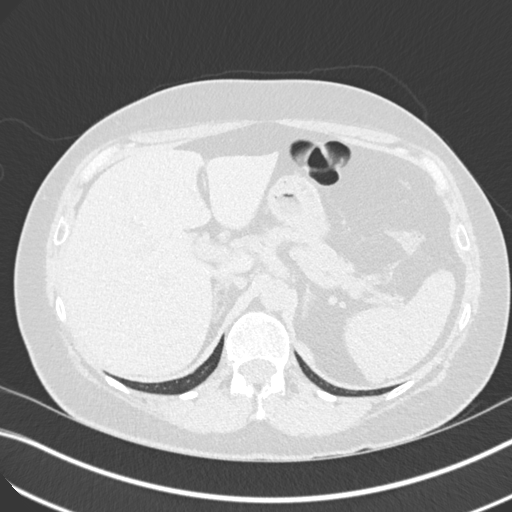
[im 35/171  lung]
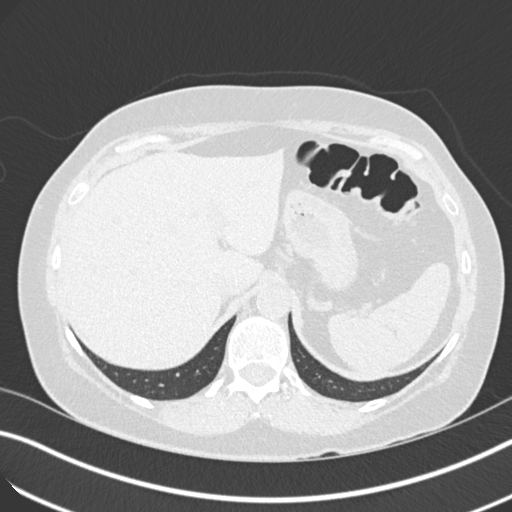
[im 45/171  lung]
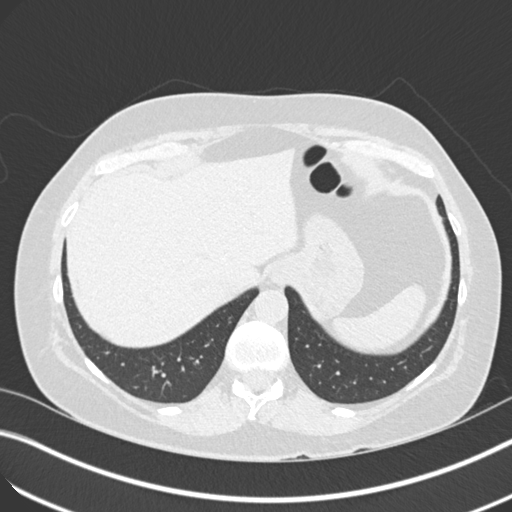
[im 57/171  mediastinal]
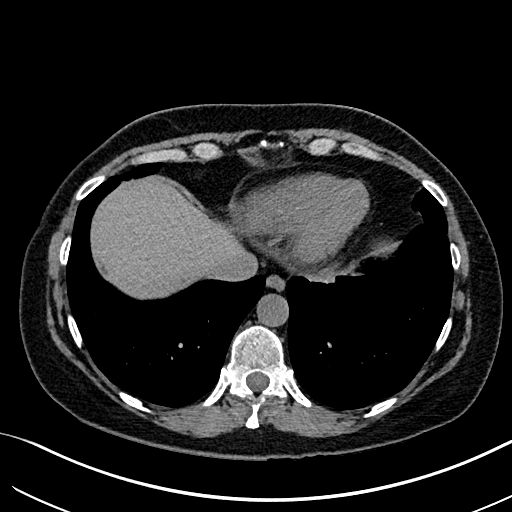
[im 57/171  lung]
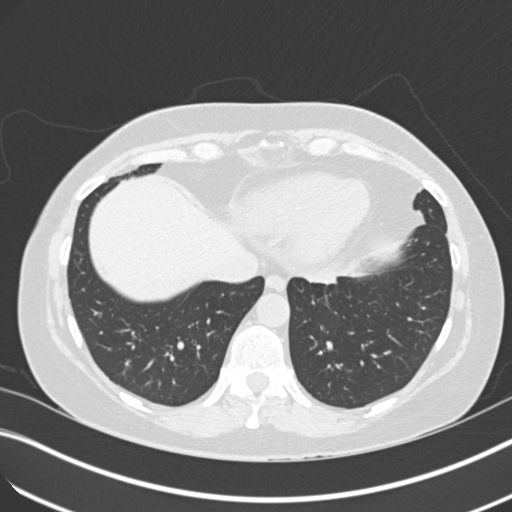
[im 69/171  lung]
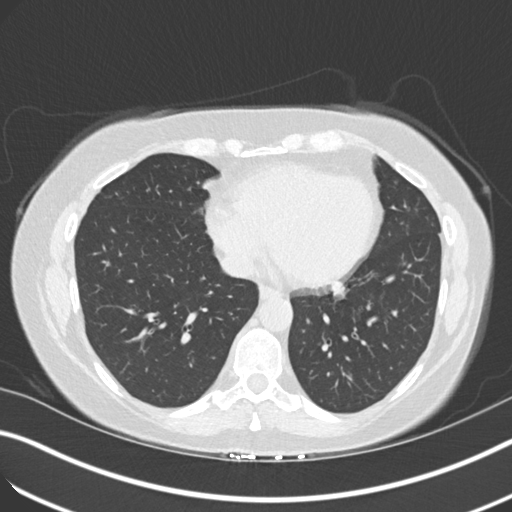
[im 76/171  lung]
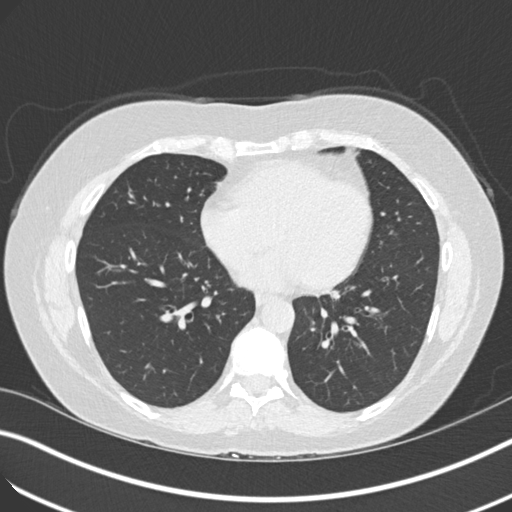
[im 89/171  lung]
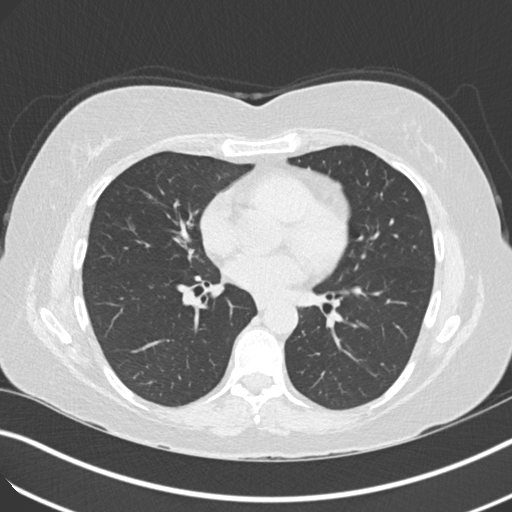
[im 95/171  mediastinal]
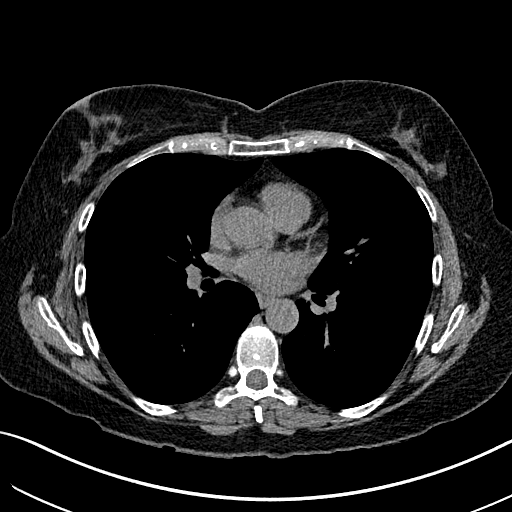
[im 95/171  lung]
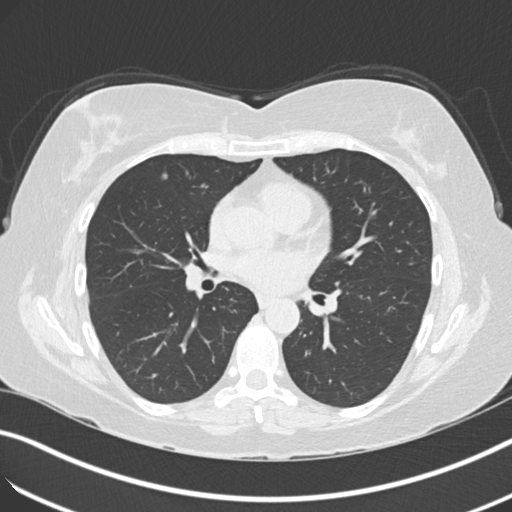
[im 103/171  lung]
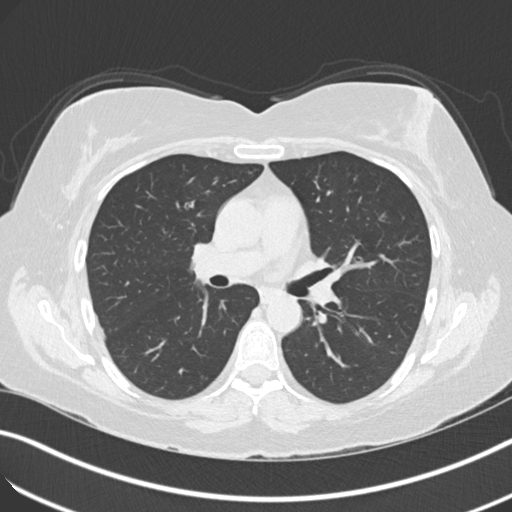
[im 114/171  lung]
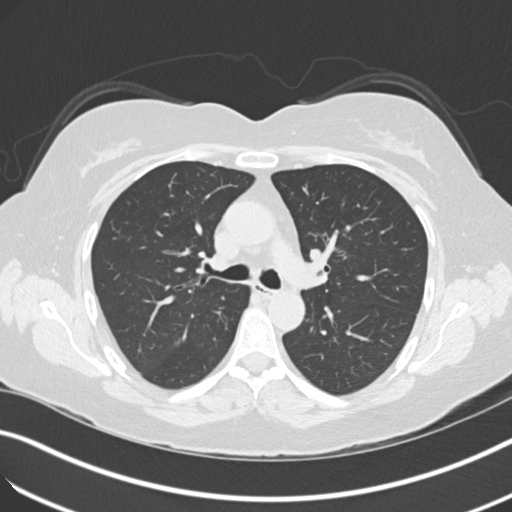
[im 126/171  lung]
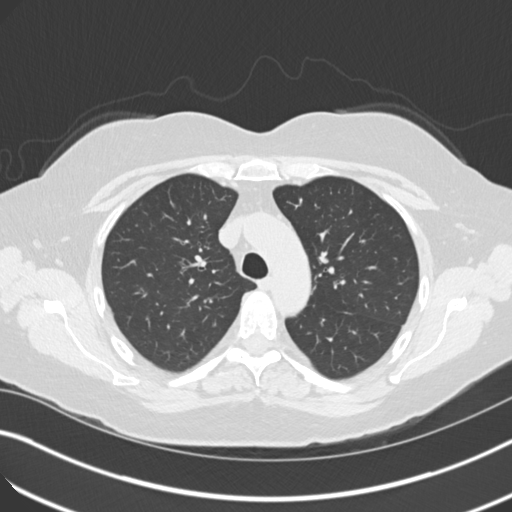
[im 137/171  mediastinal]
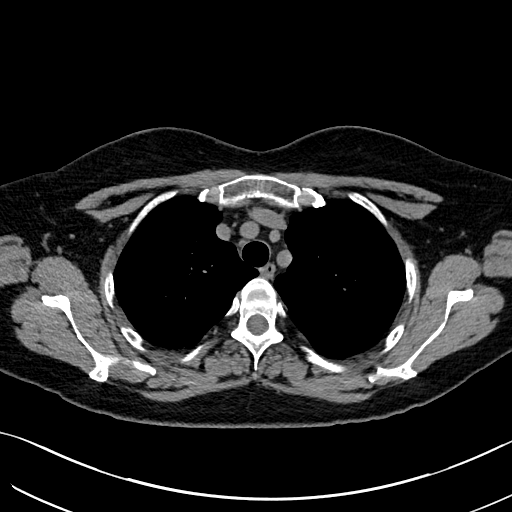
[im 137/171  lung]
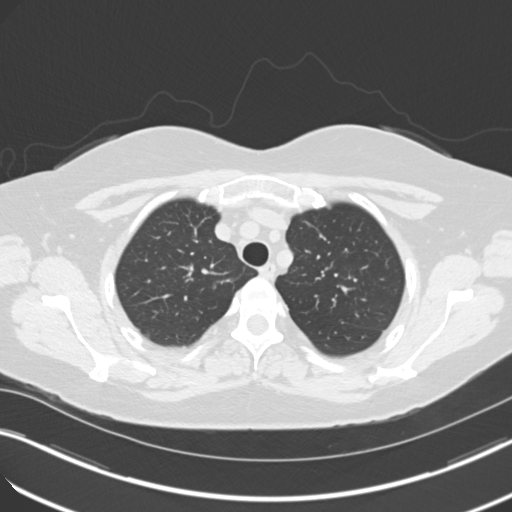
[im 145/171  lung]
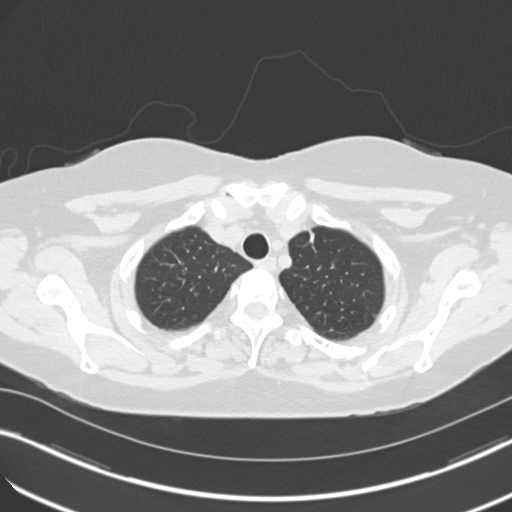
[im 158/171  lung]
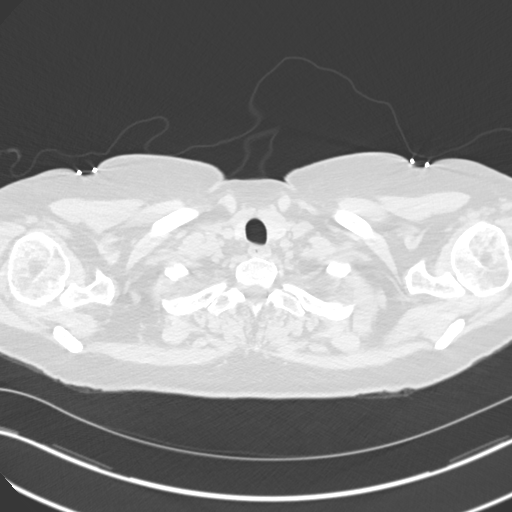

[15 of 34 positions shown; findings below may reference images not displayed]

FINDINGS: Mediastinum/Lymph Nodes: No masses or pathologically enlarged lymph
nodes identified on this un-enhanced exam.

Lungs/Pleura: There is a new from before perifissural right upper
lobe 5 mm solid pulmonary nodule (image 77 sequence 3 ). There is
also an 11 mm perifissural solid nodule within the lingula, with
mild surrounding ground-glass opacity (image 108 sequence 3 ). The
previously demonstrated 5 mm right middle lobe pulmonary nodule is
stable in size and demonstrates interval increase in density
suggestive of maturing granuloma. There is a stable 4 mm subpleural
nodule in the posterior aspect of the right lower lobe. There is
biapical, more prominent within the left apex irregular subpleural
thickening.

Upper abdomen: No acute findings.

Musculoskeletal: No chest wall mass or suspicious bone lesions
identified.
IMPRESSION: New from before perifissural 5 mm solid pulmonary nodule within the
right upper lobe, which may represent an intraparenchymal lymph
node.

An 11 mm perifissural solid-appearing nodule within the lingula,
with thin linear extension to the pleural surfaces. This may
represent an area of scarring, round atelectasis or true pulmonary
mass. Consider one of the following in 3 months for both low-risk
and high-risk individuals: (a) repeat chest CT, (b) follow-up
PET-CT, or (c) tissue sampling. This recommendation follows the
consensus statement: Guidelines for Management of Incidental
Pulmonary Nodules Detected on CT Images:From the [HOSPITAL]
0125; published online before print (10.1148/radiol.7627222273).

Stable benign-appearing right middle lobe and right lower lobe less
than 5 mm nodules.

Apparent biapical subpleural scarring.

## 2016-08-21 ENCOUNTER — Other Ambulatory Visit: Payer: Self-pay | Admitting: Physician Assistant

## 2016-08-21 DIAGNOSIS — J4540 Moderate persistent asthma, uncomplicated: Secondary | ICD-10-CM

## 2016-08-31 ENCOUNTER — Other Ambulatory Visit: Payer: Self-pay | Admitting: Physician Assistant

## 2016-08-31 DIAGNOSIS — K219 Gastro-esophageal reflux disease without esophagitis: Secondary | ICD-10-CM

## 2016-10-19 ENCOUNTER — Other Ambulatory Visit: Payer: Self-pay | Admitting: Family Medicine

## 2016-10-19 DIAGNOSIS — J309 Allergic rhinitis, unspecified: Secondary | ICD-10-CM

## 2016-10-28 ENCOUNTER — Encounter: Payer: Self-pay | Admitting: Family Medicine

## 2016-10-28 ENCOUNTER — Ambulatory Visit (INDEPENDENT_AMBULATORY_CARE_PROVIDER_SITE_OTHER): Payer: BLUE CROSS/BLUE SHIELD | Admitting: Family Medicine

## 2016-10-28 VITALS — BP 108/72 | HR 88 | Temp 97.8°F | Resp 16 | Wt 188.8 lb

## 2016-10-28 DIAGNOSIS — J4541 Moderate persistent asthma with (acute) exacerbation: Secondary | ICD-10-CM | POA: Diagnosis not present

## 2016-10-28 DIAGNOSIS — J069 Acute upper respiratory infection, unspecified: Secondary | ICD-10-CM

## 2016-10-28 DIAGNOSIS — B9789 Other viral agents as the cause of diseases classified elsewhere: Secondary | ICD-10-CM | POA: Diagnosis not present

## 2016-10-28 MED ORDER — PREDNISONE 20 MG PO TABS: ORAL_TABLET | ORAL | 0 refills | Status: DC

## 2016-10-28 MED ORDER — AMOXICILLIN-POT CLAVULANATE 875-125 MG PO TABS: 1.0000 | ORAL_TABLET | Freq: Two times a day (BID) | ORAL | 0 refills | Status: DC

## 2016-10-28 NOTE — Progress Notes (Signed)
Subjective:     Patient ID: Angela Reyes, female   DOB: Oct 23, 1960, 56 y.o.   MRN: [id number]  HPI  Chief Complaint  Patient presents with  . Asthma    Patient comes in office today with concerns of Asthma Flare up that began mid last week. Patient reports; shortness of breath, wheezing, coughing productive of green phlegm, and chest congestion. Patient reports using nebulizer treatments at home and using Pro-Air inhlaer.   Reports compliance with Breo. States sinus pressure is improving. Encouraged to get flu vaccine when better from her current illness.    Review of Systems     Objective:   Physical Exam  Constitutional: She appears well-developed and well-nourished. No distress.  Ears: T.M's intact without inflammation Sinuses: non-tender Throat: tonsils absent, no posterior pharyngeal erythema. Neck: no cervical adenopathy Lungs: Posterior diffuse wheezing     Assessment:    1. Moderate persistent asthma with acute exacerbation - predniSONE (DELTASONE) 20 MG tablet; Taper as follows: 3 pills for 4 days, two pills for 4 days, one pill for four days  Dispense: 24 tablet; Refill: 0  2. Viral upper respiratory tract infection - amoxicillin-clavulanate (AUGMENTIN) 875-125 MG tablet; Take 1 tablet by mouth 2 (two) times daily.  Dispense: 20 tablet; Refill: 0    Plan:    Start Antibiotic if sinuses not clearing over the next two days or cough worsening. Encouraged f/u with her pulmonary doctor.

## 2016-10-28 NOTE — Patient Instructions (Signed)
Continue Mucinex D. Start antibiotic if sinuses not clearing over the next two days. Continue to schedule albuterol.

## 2016-11-21 ENCOUNTER — Other Ambulatory Visit: Payer: Self-pay | Admitting: Physician Assistant

## 2016-11-21 DIAGNOSIS — K219 Gastro-esophageal reflux disease without esophagitis: Secondary | ICD-10-CM

## 2016-12-13 ENCOUNTER — Ambulatory Visit (INDEPENDENT_AMBULATORY_CARE_PROVIDER_SITE_OTHER): Payer: BLUE CROSS/BLUE SHIELD | Admitting: Family Medicine

## 2016-12-13 ENCOUNTER — Encounter: Payer: Self-pay | Admitting: Family Medicine

## 2016-12-13 VITALS — BP 90/58 | HR 75 | Temp 98.0°F | Resp 18 | Wt 185.8 lb

## 2016-12-13 DIAGNOSIS — J301 Allergic rhinitis due to pollen: Secondary | ICD-10-CM | POA: Diagnosis not present

## 2016-12-13 DIAGNOSIS — J4540 Moderate persistent asthma, uncomplicated: Secondary | ICD-10-CM

## 2016-12-13 MED ORDER — PREDNISONE 20 MG PO TABS: ORAL_TABLET | ORAL | 0 refills | Status: DC

## 2016-12-13 MED ORDER — FLUTICASONE FUROATE-VILANTEROL 200-25 MCG/INH IN AEPB: 1.0000 | INHALATION_SPRAY | Freq: Every evening | RESPIRATORY_TRACT | 3 refills | Status: DC

## 2016-12-13 NOTE — Patient Instructions (Signed)
Do follow up with your pulmonary doctor.

## 2016-12-13 NOTE — Progress Notes (Signed)
Subjective:     Patient ID: Angela Reyes, female   DOB: Jun 02, 1961, 56 y.o.   MRN: [id number]  HPI  Chief Complaint  Patient presents with  . Asthma    Patient comes in office today with concerns of asthma flare up over the past two weeks. Patient reports shortness of breath, cough and wheezing. Patient reports taking Mucinex D and using Breo inhaler.   Reports her sinuses developed clear drainage prior to the onset of wheezing: "I don't feel sick". Reports compliance with allergy and asthma  medication. States she has been allergy tested in the past with no clearcut triggers identified. Reports frequent use of her Proair inhaler. Works as an Glass blower/designer.   Review of Systems     Objective:   Physical Exam  Constitutional: She appears well-developed and well-nourished. No distress.  Ears: T.M's intact without inflammation Throat: no tonsillar enlargement or exudate Neck: no cervical adenopathy Lungs: bilateral posterior expiratory wheezes     Assessment:    1. Acute seasonal allergic rhinitis due to pollen - predniSONE (DELTASONE) 20 MG tablet; Taper as follows: 3 pills for 4 days, two pills for 4 days, one pill for four days  Dispense: 24 tablet; Refill: 0  2. Moderate persistent asthma without complication: prefers longer prednisone taper. - fluticasone furoate-vilanterol (BREO ELLIPTA) 200-25 MCG/INH AEPB; Inhale 1 puff into the lungs every evening.  Dispense: 60 each; Refill: 3    Plan:    Recommended early f/u with her pulmonary doctor.

## 2017-03-10 ENCOUNTER — Ambulatory Visit (INDEPENDENT_AMBULATORY_CARE_PROVIDER_SITE_OTHER): Payer: BLUE CROSS/BLUE SHIELD | Admitting: Family Medicine

## 2017-03-10 ENCOUNTER — Encounter: Payer: Self-pay | Admitting: Family Medicine

## 2017-03-10 VITALS — BP 108/58 | HR 91 | Temp 98.2°F | Resp 17 | Wt 186.2 lb

## 2017-03-10 DIAGNOSIS — J301 Allergic rhinitis due to pollen: Secondary | ICD-10-CM | POA: Diagnosis not present

## 2017-03-10 DIAGNOSIS — J4541 Moderate persistent asthma with (acute) exacerbation: Secondary | ICD-10-CM

## 2017-03-10 MED ORDER — PREDNISONE 20 MG PO TABS: ORAL_TABLET | ORAL | 0 refills | Status: DC

## 2017-03-10 NOTE — Patient Instructions (Signed)
Please set up a visit with your pulmonary doctor.

## 2017-03-10 NOTE — Progress Notes (Signed)
Subjective:     Patient ID: Angela Reyes, female   DOB: [birth year], 56 y.o.   MRN: [id number]  HPI  Chief Complaint  Patient presents with  . Cough    Patient comes in office today with complaint of cough for the past 3-4 weeks, patient complaints of shortness of breath and wheezing associated. Patient has been using prescription inhaler and nebulizer treatment at home.   States allergies are not optimally controlled and have exacerbated her asthma. Reports compliance with all medication. "I am overdue to see my pulmonary doctor." Using her albuterol inhaler twice daily and MDI 3 x day   Review of Systems     Objective:   Physical Exam  Constitutional: She appears well-developed and well-nourished. No distress.  Ears: T.M's intact without inflammation Throat: no tonsillar enlargement or exudate Neck: no cervical adenopathy Lungs: bilateral expiratory wheezes; deep breathing provokes cough.     Assessment:    1. Moderate persistent asthma with acute exacerbation - predniSONE (DELTASONE) 20 MG tablet; Taper as follows: 3 pills for 4 days, two pills for 4 days, one pill for four days  Dispense: 24 tablet; Refill: 0  2. Acute seasonal allergic rhinitis due to pollen - predniSONE (DELTASONE) 20 MG tablet; Taper as follows: 3 pills for 4 days, two pills for 4 days, one pill for four days  Dispense: 24 tablet; Refill: 0    Plan:    Schedule f/u with pulmonary doctor.

## 2017-04-01 ENCOUNTER — Telehealth: Payer: Self-pay | Admitting: Physician Assistant

## 2017-04-01 ENCOUNTER — Other Ambulatory Visit: Payer: Self-pay | Admitting: Family Medicine

## 2017-04-01 DIAGNOSIS — J4540 Moderate persistent asthma, uncomplicated: Secondary | ICD-10-CM

## 2017-04-01 MED ORDER — FLUTICASONE FUROATE-VILANTEROL 200-25 MCG/INH IN AEPB: 1.0000 | INHALATION_SPRAY | Freq: Every evening | RESPIRATORY_TRACT | 3 refills | Status: DC

## 2017-04-01 NOTE — Telephone Encounter (Signed)
[facility] the [facility]:  fluticasone furoate-vilanterol (BREO ELLIPTA) 200-25 MCG/INH AEPB.  Use 1 puffs into the lungs every evening.  90 day supply.  [facility]

## 2017-04-01 NOTE — Telephone Encounter (Signed)
Last filled 2/23/18KW

## 2017-06-16 ENCOUNTER — Ambulatory Visit (INDEPENDENT_AMBULATORY_CARE_PROVIDER_SITE_OTHER): Payer: BLUE CROSS/BLUE SHIELD | Admitting: Physician Assistant

## 2017-06-16 ENCOUNTER — Encounter: Payer: Self-pay | Admitting: Physician Assistant

## 2017-06-16 VITALS — BP 120/80 | HR 115 | Temp 97.9°F | Resp 20 | Wt 184.8 lb

## 2017-06-16 DIAGNOSIS — J4541 Moderate persistent asthma with (acute) exacerbation: Secondary | ICD-10-CM | POA: Diagnosis not present

## 2017-06-16 MED ORDER — ALBUTEROL SULFATE (2.5 MG/3ML) 0.083% IN NEBU: 2.5000 mg | INHALATION_SOLUTION | Freq: Four times a day (QID) | RESPIRATORY_TRACT | 1 refills | Status: DC | PRN

## 2017-06-16 MED ORDER — PREDNISONE 10 MG PO TABS: ORAL_TABLET | ORAL | 0 refills | Status: DC

## 2017-06-16 MED ORDER — AMOXICILLIN-POT CLAVULANATE 875-125 MG PO TABS: 1.0000 | ORAL_TABLET | Freq: Two times a day (BID) | ORAL | 0 refills | Status: DC

## 2017-06-16 NOTE — Patient Instructions (Signed)

## 2017-06-16 NOTE — Progress Notes (Signed)
Patient: Angela Reyes Female    DOB: 1961-01-09   56 y.o.   MRN: [id number] Visit Date: 06/16/2017  Today's Provider: Mar Daring, PA-C   Chief Complaint  Patient presents with  . Cough   Subjective:    HPI  Patient here today C/O cough, nasal congestion, runny nose and shortness of breath x's 2 weeks. Patient reports using albuterol 2 puffs several times during the day. Patient reports using BREO daily. Patient reports mild improvement with albuterol. Patient reports coughing up phlegm yellow/green.      No Known Allergies   Current Outpatient Prescriptions:  .  albuterol (PROAIR HFA) 108 (90 Base) MCG/ACT inhaler, Inhale 2 puffs into the lungs every 6 (six) hours as needed for wheezing or shortness of breath., Disp: 1 Inhaler, Rfl: 5 .  albuterol (PROVENTIL) (2.5 MG/3ML) 0.083% nebulizer solution, Take 3 mLs (2.5 mg total) by nebulization every 6 (six) hours as needed for wheezing or shortness of breath., Disp: 75 mL, Rfl: 1 .  cetirizine (ZYRTEC) 10 MG tablet, Take 10 mg by mouth at bedtime. Reported on 05/01/2016, Disp: , Rfl:  .  esomeprazole (NEXIUM) 40 MG capsule, TAKE 1 CAPSULE (40 MG TOTAL) BY MOUTH DAILY AT 12 NOON., Disp: 30 capsule, Rfl: 5 .  fluticasone furoate-vilanterol (BREO ELLIPTA) 200-25 MCG/INH AEPB, Inhale 1 puff into the lungs every evening., Disp: 84 each, Rfl: 3 .  mometasone (NASONEX) 50 MCG/ACT nasal spray, Place 2 sprays into the nose daily. (Patient taking differently: Place 2 sprays into the nose daily as needed. ), Disp: 51 g, Rfl: 5 .  montelukast (SINGULAIR) 10 MG tablet, TAKE 1 TABLET (10 MG TOTAL) BY MOUTH AT BEDTIME., Disp: 90 tablet, Rfl: 3  Review of Systems  Constitutional: Positive for fatigue. Negative for chills and fever.  HENT: Positive for congestion, postnasal drip, rhinorrhea, sinus pressure and sore throat. Negative for ear pain, tinnitus and trouble swallowing.   Respiratory: Positive for cough, chest tightness,  shortness of breath and wheezing.   Cardiovascular: Negative for chest pain, palpitations and leg swelling.  Gastrointestinal: Negative for abdominal pain and diarrhea.  Neurological: Positive for headaches. Negative for dizziness and light-headedness.    Social History  Substance Use Topics  . Smoking status: Never Smoker  . Smokeless tobacco: Never Used  . Alcohol use No   Objective:   BP 120/80 (BP Location: Right Arm, Patient Position: Sitting, Cuff Size: Large)   Pulse (!) 115   Temp 97.9 F (36.6 C) (Oral)   Resp 20   Wt 184 lb 12.8 oz (83.8 kg)   LMP 02/10/2015 Comment: 1st one since dec 2014  SpO2 93%   BMI 26.90 kg/m  Vitals:   06/16/17 1454  BP: 120/80  Pulse: (!) 115  Resp: 20  Temp: 97.9 F (36.6 C)  TempSrc: Oral  SpO2: 93%  Weight: 184 lb 12.8 oz (83.8 kg)     Physical Exam  Constitutional: She appears well-developed and well-nourished. No distress.  HENT:  Head: Normocephalic and atraumatic.  Right Ear: Hearing, tympanic membrane, external ear and ear canal normal.  Left Ear: Hearing, tympanic membrane, external ear and ear canal normal.  Nose: Mucosal edema and rhinorrhea present. No nose lacerations. Right sinus exhibits frontal sinus tenderness. Right sinus exhibits no maxillary sinus tenderness. Left sinus exhibits frontal sinus tenderness. Left sinus exhibits no maxillary sinus tenderness.  Mouth/Throat: Uvula is midline and mucous membranes are normal. Posterior oropharyngeal erythema present. No oropharyngeal exudate or  posterior oropharyngeal edema.  Ethmoid tenderness  Eyes: Pupils are equal, round, and reactive to light. Conjunctivae are normal. Right eye exhibits no discharge. Left eye exhibits no discharge. No scleral icterus.  Neck: Normal range of motion. Neck supple. No tracheal deviation present. No thyromegaly present.  Cardiovascular: Normal rate, regular rhythm and normal heart sounds.  Exam reveals no gallop and no friction rub.   No  murmur heard. Pulmonary/Chest: Effort normal. No stridor. No respiratory distress. She has decreased breath sounds. She has wheezes. She has rhonchi in the right lower field and the left lower field. She has no rales.  Lymphadenopathy:    She has no cervical adenopathy.  Skin: Skin is warm and dry. She is not diaphoretic.  Vitals reviewed.      Assessment & Plan:     1. Moderate persistent asthma with acute exacerbation Worsening symptoms that have not responded to OTC medications. Will give augmentin as below. Continue allergy and asthma medications. Stay well hydrated and get plenty of rest. Call if no symptom improvement or if symptoms worsen. - albuterol (PROVENTIL) (2.5 MG/3ML) 0.083% nebulizer solution; Take 3 mLs (2.5 mg total) by nebulization every 6 (six) hours as needed for wheezing or shortness of breath.  Dispense: 75 mL; Refill: 1 - amoxicillin-clavulanate (AUGMENTIN) 875-125 MG tablet; Take 1 tablet by mouth 2 (two) times daily.  Dispense: 20 tablet; Refill: 0 - predniSONE (DELTASONE) 10 MG tablet; Take 6 tabs PO on day 1&2, 5 tabs PO on day 3&4, 4 tabs PO on day 5&6, 3 tabs PO on day 7&8, 2 tabs PO on day 9&10, 1 tab PO on day 11&12.  Dispense: 42 tablet; Refill: 0       Mar Daring, PA-C  [facility]

## 2017-07-23 ENCOUNTER — Encounter: Payer: Self-pay | Admitting: Physician Assistant

## 2017-07-23 ENCOUNTER — Ambulatory Visit (INDEPENDENT_AMBULATORY_CARE_PROVIDER_SITE_OTHER): Payer: BLUE CROSS/BLUE SHIELD | Admitting: Physician Assistant

## 2017-07-23 VITALS — BP 150/90 | HR 74 | Temp 97.7°F | Resp 18 | Ht 69.5 in | Wt 188.8 lb

## 2017-07-23 DIAGNOSIS — Z1231 Encounter for screening mammogram for malignant neoplasm of breast: Secondary | ICD-10-CM

## 2017-07-23 DIAGNOSIS — Z13220 Encounter for screening for lipoid disorders: Secondary | ICD-10-CM

## 2017-07-23 DIAGNOSIS — R911 Solitary pulmonary nodule: Secondary | ICD-10-CM | POA: Diagnosis not present

## 2017-07-23 DIAGNOSIS — Z0000 Encounter for general adult medical examination without abnormal findings: Secondary | ICD-10-CM

## 2017-07-23 DIAGNOSIS — Z1239 Encounter for other screening for malignant neoplasm of breast: Secondary | ICD-10-CM

## 2017-07-23 DIAGNOSIS — J4541 Moderate persistent asthma with (acute) exacerbation: Secondary | ICD-10-CM

## 2017-07-23 DIAGNOSIS — Z114 Encounter for screening for human immunodeficiency virus [HIV]: Secondary | ICD-10-CM

## 2017-07-23 DIAGNOSIS — J45991 Cough variant asthma: Secondary | ICD-10-CM

## 2017-07-23 DIAGNOSIS — Z1159 Encounter for screening for other viral diseases: Secondary | ICD-10-CM

## 2017-07-23 DIAGNOSIS — Z136 Encounter for screening for cardiovascular disorders: Secondary | ICD-10-CM

## 2017-07-23 DIAGNOSIS — Z1211 Encounter for screening for malignant neoplasm of colon: Secondary | ICD-10-CM

## 2017-07-23 DIAGNOSIS — Z131 Encounter for screening for diabetes mellitus: Secondary | ICD-10-CM

## 2017-07-23 DIAGNOSIS — Z23 Encounter for immunization: Secondary | ICD-10-CM | POA: Diagnosis not present

## 2017-07-23 MED ORDER — PREDNISONE 10 MG PO TABS: ORAL_TABLET | ORAL | 0 refills | Status: DC

## 2017-07-23 NOTE — Progress Notes (Signed)
Patient: Angela Reyes, Female    DOB: [birth year], 56 y.o.   MRN: [id number] Visit Date: 07/23/2017  Today's Provider: Mar Daring, PA-C   Chief Complaint  Patient presents with  . Annual Exam   Subjective:    Annual physical exam Angela Reyes is a 56 y.o. female who presents today for health maintenance and complete physical. She feels fairly well. She reports exercising. She reports she is sleeping fairly well, lots of stress.  Last CPE:11/21/14 Pap:11/21/2014 Negative,HPV-Neg Mammogram:12/20/2008 BI-RADS BMD:04/28/2014 Osteopenia -----------------------------------------------------------------   Review of Systems  Constitutional: Negative.   HENT: Positive for congestion, postnasal drip and rhinorrhea.   Eyes: Negative.   Respiratory: Positive for cough.   Cardiovascular: Negative.   Gastrointestinal: Negative.   Endocrine: Negative.   Genitourinary: Negative.   Musculoskeletal: Positive for back pain and neck stiffness.  Skin: Negative.   Allergic/Immunologic: Positive for environmental allergies.  Neurological: Negative.   Hematological: Negative.   Psychiatric/Behavioral: Negative.     Social History      She  reports that she has never smoked. She has never used smokeless tobacco. She reports that she does not drink alcohol or use drugs.       Social History   Social History  . Marital status: Divorced    Spouse name: N/A  . Number of children: N/A  . Years of education: N/A   Social History Main Topics  . Smoking status: Never Smoker  . Smokeless tobacco: Never Used  . Alcohol use No  . Drug use: No  . Sexual activity: Not Asked     Comment: friend has vasctomy   Other Topics Concern  . None   Social History Narrative  . None    Past Medical History:  Diagnosis Date  . Asthma   . Chronic rhinosinusitis   . GERD (gastroesophageal reflux disease)   . History of gestational diabetes   . History of hiatal hernia   .  OSA (obstructive sleep apnea)    non-compliant cpap  . Pulmonary nodule, right    middle lobe-- stable low risk--  pulmologist,  dr Chase Caller  . Right ureteral stone      Patient Active Problem List   Diagnosis Date Noted  . Allergic rhinitis 05/01/2015  . Absolute anemia 03/06/2015  . At risk for osteoporosis 03/06/2015  . Long term current use of systemic steroids 03/06/2015  . Acid reflux 03/06/2015  . External hemorrhoid 03/06/2015  . Irregular bleeding 03/06/2015  . Plantar fasciitis 03/06/2015  . Asthma 01/27/2014  . Lung nodule 10/14/2013  . Chronic cough 09/12/2013  . Asthma, cough variant 06/30/2009  . Fatty tumor 12/08/2008  . Adiposity 12/06/2008  . Anxiety 11/17/2008  . Adaptive colitis 11/17/2008  . LBP (low back pain) 11/17/2008    Past Surgical History:  Procedure Laterality Date  . CYSTOSCOPY W/ URETERAL STENT PLACEMENT Right 06/07/2015   Procedure: CYSTOSCOPY WITH RETROGRADE PYELOGRAM/ STONE EXTRACTION AND URETERAL STENT PLACEMENT;  Surgeon: Cleon Gustin, MD;  Location: [facility];  Service: Urology;  Laterality: Right;  . EXTRACORPOREAL SHOCK WAVE LITHOTRIPSY Right 05-15-2015  . HOLMIUM LASER APPLICATION Right 9/38/1017   Procedure: HOLMIUM LASER APPLICATION;  Surgeon: Cleon Gustin, MD;  Location: [facility];  Service: Urology;  Laterality: Right;  . TONSILLECTOMY  age 51  . WISDOM TOOTH EXTRACTION  age 13    Family History        Family Status  Relation Status  .  Mother Alive  . Father Alive  . Sister Alive  . Brother Alive  . MGM Deceased  . MGF Deceased  . PGM Deceased  . PGF Deceased  . Brother Alive  . Unknown (Not Specified)        Her family history includes Allergies in her unknown relative; Cancer in her paternal grandfather; Hypertension in her father, maternal grandmother, and mother; Kidney disease in her brother.     No Known Allergies   Current Outpatient Prescriptions:  .  albuterol  (PROAIR HFA) 108 (90 Base) MCG/ACT inhaler, Inhale 2 puffs into the lungs every 6 (six) hours as needed for wheezing or shortness of breath., Disp: 1 Inhaler, Rfl: 5 .  albuterol (PROVENTIL) (2.5 MG/3ML) 0.083% nebulizer solution, Take 3 mLs (2.5 mg total) by nebulization every 6 (six) hours as needed for wheezing or shortness of breath., Disp: 75 mL, Rfl: 1 .  amoxicillin-clavulanate (AUGMENTIN) 875-125 MG tablet, Take 1 tablet by mouth 2 (two) times daily., Disp: 20 tablet, Rfl: 0 .  cetirizine (ZYRTEC) 10 MG tablet, Take 10 mg by mouth at bedtime. Reported on 05/01/2016, Disp: , Rfl:  .  esomeprazole (NEXIUM) 40 MG capsule, TAKE 1 CAPSULE (40 MG TOTAL) BY MOUTH DAILY AT 12 NOON., Disp: 30 capsule, Rfl: 5 .  fluticasone furoate-vilanterol (BREO ELLIPTA) 200-25 MCG/INH AEPB, Inhale 1 puff into the lungs every evening., Disp: 84 each, Rfl: 3 .  mometasone (NASONEX) 50 MCG/ACT nasal spray, Place 2 sprays into the nose daily. (Patient taking differently: Place 2 sprays into the nose daily as needed. ), Disp: 51 g, Rfl: 5 .  montelukast (SINGULAIR) 10 MG tablet, TAKE 1 TABLET (10 MG TOTAL) BY MOUTH AT BEDTIME., Disp: 90 tablet, Rfl: 3 .  predniSONE (DELTASONE) 10 MG tablet, Take 6 tabs PO on day 1&2, 5 tabs PO on day 3&4, 4 tabs PO on day 5&6, 3 tabs PO on day 7&8, 2 tabs PO on day 9&10, 1 tab PO on day 11&12., Disp: 42 tablet, Rfl: 0   Patient Care Team: Mar Daring, PA-C as PCP - General (Family Medicine)      Objective:   Vitals: BP (!) 150/90 (BP Location: Left Arm, Patient Position: Sitting, Cuff Size: Normal)   Pulse 74   Temp 97.7 F (36.5 C) (Oral)   Resp 18   Ht 5' 9.5" (1.765 m)   Wt 188 lb 12.8 oz (85.6 kg)   LMP 02/10/2015 Comment: 1st one since dec 2014  SpO2 96%   BMI 27.48 kg/m    Vitals:   07/23/17 1512  BP: (!) 150/90  Pulse: 74  Resp: 18  Temp: 97.7 F (36.5 C)  TempSrc: Oral  SpO2: 96%  Weight: 188 lb 12.8 oz (85.6 kg)  Height: 5' 9.5" (1.765 m)      Physical Exam  Constitutional: She is oriented to person, place, and time. She appears well-developed and well-nourished. No distress.  HENT:  Head: Normocephalic and atraumatic.  Right Ear: Hearing, tympanic membrane, external ear and ear canal normal.  Left Ear: Hearing, tympanic membrane, external ear and ear canal normal.  Nose: Nose normal.  Mouth/Throat: Uvula is midline, oropharynx is clear and moist and mucous membranes are normal. No oropharyngeal exudate.  Eyes: Pupils are equal, round, and reactive to light. Conjunctivae and EOM are normal. Right eye exhibits no discharge. Left eye exhibits no discharge. No scleral icterus.  Neck: Normal range of motion. Neck supple. No JVD present. Carotid bruit is not present. No tracheal  deviation present. No thyromegaly present.  Cardiovascular: Normal rate, regular rhythm, normal heart sounds and intact distal pulses.  Exam reveals no gallop and no friction rub.   No murmur heard. Pulmonary/Chest: Effort normal. No respiratory distress. She has decreased breath sounds. She has wheezes (fine inspiratory and expiratory throughout). She has no rhonchi. She has no rales. She exhibits no tenderness. Right breast exhibits no inverted nipple, no mass, no nipple discharge, no skin change and no tenderness. Left breast exhibits no inverted nipple, no mass, no nipple discharge, no skin change and no tenderness. Breasts are symmetrical.  Abdominal: Soft. Bowel sounds are normal. She exhibits no distension and no mass. There is no tenderness. There is no rebound and no guarding.  Musculoskeletal: Normal range of motion. She exhibits no edema or tenderness.  Lymphadenopathy:    She has no cervical adenopathy.  Neurological: She is alert and oriented to person, place, and time. She has normal reflexes.  Skin: Skin is warm and dry. No rash noted. She is not diaphoretic.  Psychiatric: She has a normal mood and affect. Her behavior is normal. Judgment and  thought content normal.  Vitals reviewed.    Depression Screen PHQ 2/9 Scores 07/23/2017  PHQ - 2 Score 0  PHQ- 9 Score 4      Assessment & Plan:     Routine Health Maintenance and Physical Exam  Exercise Activities and Dietary recommendations Goals    None      Immunization History  Administered Date(s) Administered  . Pneumococcal Polysaccharide-23 08/21/2012, 03/03/2013  . Tdap 11/21/2014    Health Maintenance  Topic Date Due  . Hepatitis C Screening  16-Aug-1961  . HIV Screening  08/28/1977  . MAMMOGRAM  08/28/2012  . COLONOSCOPY  08/28/2012  . INFLUENZA VACCINE  05/21/2017  . PAP SMEAR  11/21/2017  . TETANUS/TDAP  11/21/2024     Discussed health benefits of physical activity, and encouraged her to engage in regular exercise appropriate for her age and condition.    1. Annual physical exam Normal physical exam today. Will check labs as below and f/u pending lab results. If labs are stable and WNL she will not need to have these rechecked for one year at her next annual physical exam. She is to call the office in the meantime if she has any acute issue, questions or concerns. - CBC with Differential/Platelet - Comprehensive metabolic panel - TSH  2. Breast cancer screening Breast exam today was normal. There is no family history of breast cancer. She does perform regular self breast exams. Mammogram was ordered as below. Information for [facility] was given to patient so she may schedule her mammogram at her convenience. - MM DIGITAL SCREENING BILATERAL; Future  3. Colon cancer screening Unable to tolerate bowel prep (threw it up). Will order cologuard as below.  - Cologuard  4. Asthma, cough variant Worsening over the last week or so. Will add prednisone taper as below. She is to call if symptoms worsen.  - predniSONE (DELTASONE) 10 MG tablet; Take 6 tabs PO on day 1&2, 5 tabs PO on day 3&4, 4 tabs PO on day 5&6, 3 tabs PO on day 7&8, 2 tabs  PO on day 9&10, 1 tab PO on day 11&12.  Dispense: 42 tablet; Refill: 0  5. Moderate persistent asthma with acute exacerbation See above medical treatment plan. - predniSONE (DELTASONE) 10 MG tablet; Take 6 tabs PO on day 1&2, 5 tabs PO on day 3&4, 4 tabs PO  on day 5&6, 3 tabs PO on day 7&8, 2 tabs PO on day 9&10, 1 tab PO on day 11&12.  Dispense: 42 tablet; Refill: 0  6. Lung nodule Previously noticed lung nodules on CT. Last was 03/26/2016. She has f/u with Dr. Ashby Dawes, Pulmonology, and was to have repeat chest ct w/o contrast in one year. This has not been done thus ordered as below. I will f/u pending results.  - CT Chest Wo Contrast; Future  7. Diabetes mellitus screening Will check labs as below and f/u pending results. - Hemoglobin A1c  8. Need for hepatitis C screening test - Hepatitis C antibody  9. Screening for HIV without presence of risk factors - HIV antibody  10. Encounter for lipid screening for cardiovascular disease Will check labs as below and f/u pending results. - Lipid panel  11. Need for influenza vaccination Flu vaccine given today without complication. Patient sat upright for 15 minutes to check for adverse reaction before being released. - Flu Vaccine QUAD 36+ mos IM  --------------------------------------------------------------------    Mar Daring, PA-C  [facility]

## 2017-07-23 NOTE — Patient Instructions (Signed)
Health Maintenance for Postmenopausal Women Menopause is a normal process in which your reproductive ability comes to an end. This process happens gradually over a span of months to years, usually between the ages of 22 and 9. Menopause is complete when you have missed 12 consecutive menstrual periods. It is important to talk with your health care provider about some of the most common conditions that affect postmenopausal women, such as heart disease, cancer, and bone loss (osteoporosis). Adopting a healthy lifestyle and getting preventive care can help to promote your health and wellness. Those actions can also lower your chances of developing some of these common conditions. What should I know about menopause? During menopause, you may experience a number of symptoms, such as:  Moderate-to-severe hot flashes.  Night sweats.  Decrease in sex drive.  Mood swings.  Headaches.  Tiredness.  Irritability.  Memory problems.  Insomnia.  Choosing to treat or not to treat menopausal changes is an individual decision that you make with your health care provider. What should I know about hormone replacement therapy and supplements? Hormone therapy products are effective for treating symptoms that are associated with menopause, such as hot flashes and night sweats. Hormone replacement carries certain risks, especially as you become older. If you are thinking about using estrogen or estrogen with progestin treatments, discuss the benefits and risks with your health care provider. What should I know about heart disease and stroke? Heart disease, heart attack, and stroke become more likely as you age. This may be due, in part, to the hormonal changes that your body experiences during menopause. These can affect how your body processes dietary fats, triglycerides, and cholesterol. Heart attack and stroke are both medical emergencies. There are many things that you can do to help prevent heart disease  and stroke:  Have your blood pressure checked at least every 1-2 years. High blood pressure causes heart disease and increases the risk of stroke.  If you are 53-22 years old, ask your health care provider if you should take aspirin to prevent a heart attack or a stroke.  Do not use any tobacco products, including cigarettes, chewing tobacco, or electronic cigarettes. If you need help quitting, ask your health care provider.  It is important to eat a healthy diet and maintain a healthy weight. ? Be sure to include plenty of vegetables, fruits, low-fat dairy products, and lean protein. ? Avoid eating foods that are high in solid fats, added sugars, or salt (sodium).  Get regular exercise. This is one of the most important things that you can do for your health. ? Try to exercise for at least 150 minutes each week. The type of exercise that you do should increase your heart rate and make you sweat. This is known as moderate-intensity exercise. ? Try to do strengthening exercises at least twice each week. Do these in addition to the moderate-intensity exercise.  Know your numbers.Ask your health care provider to check your cholesterol and your blood glucose. Continue to have your blood tested as directed by your health care provider.  What should I know about cancer screening? There are several types of cancer. Take the following steps to reduce your risk and to catch any cancer development as early as possible. Breast Cancer  Practice breast self-awareness. ? This means understanding how your breasts normally appear and feel. ? It also means doing regular breast self-exams. Let your health care provider know about any changes, no matter how small.  If you are 40  or older, have a clinician do a breast exam (clinical breast exam or CBE) every year. Depending on your age, family history, and medical history, it may be recommended that you also have a yearly breast X-ray (mammogram).  If you  have a family history of breast cancer, talk with your health care provider about genetic screening.  If you are at high risk for breast cancer, talk with your health care provider about having an MRI and a mammogram every year.  Breast cancer (BRCA) gene test is recommended for women who have family members with BRCA-related cancers. Results of the assessment will determine the need for genetic counseling and BRCA1 and for BRCA2 testing. BRCA-related cancers include these types: ? Breast. This occurs in males or females. ? Ovarian. ? Tubal. This may also be called fallopian tube cancer. ? Cancer of the abdominal or pelvic lining (peritoneal cancer). ? Prostate. ? Pancreatic.  Cervical, Uterine, and Ovarian Cancer Your health care provider may recommend that you be screened regularly for cancer of the pelvic organs. These include your ovaries, uterus, and vagina. This screening involves a pelvic exam, which includes checking for microscopic changes to the surface of your cervix (Pap test).  For women ages 21-65, health care providers may recommend a pelvic exam and a Pap test every three years. For women ages 79-65, they may recommend the Pap test and pelvic exam, combined with testing for human papilloma virus (HPV), every five years. Some types of HPV increase your risk of cervical cancer. Testing for HPV may also be done on women of any age who have unclear Pap test results.  Other health care providers may not recommend any screening for nonpregnant women who are considered low risk for pelvic cancer and have no symptoms. Ask your health care provider if a screening pelvic exam is right for you.  If you have had past treatment for cervical cancer or a condition that could lead to cancer, you need Pap tests and screening for cancer for at least 20 years after your treatment. If Pap tests have been discontinued for you, your risk factors (such as having a new sexual partner) need to be  reassessed to determine if you should start having screenings again. Some women have medical problems that increase the chance of getting cervical cancer. In these cases, your health care provider may recommend that you have screening and Pap tests more often.  If you have a family history of uterine cancer or ovarian cancer, talk with your health care provider about genetic screening.  If you have vaginal bleeding after reaching menopause, tell your health care provider.  There are currently no reliable tests available to screen for ovarian cancer.  Lung Cancer Lung cancer screening is recommended for adults 69-62 years old who are at high risk for lung cancer because of a history of smoking. A yearly low-dose CT scan of the lungs is recommended if you:  Currently smoke.  Have a history of at least 30 pack-years of smoking and you currently smoke or have quit within the past 15 years. A pack-year is smoking an average of one pack of cigarettes per day for one year.  Yearly screening should:  Continue until it has been 15 years since you quit.  Stop if you develop a health problem that would prevent you from having lung cancer treatment.  Colorectal Cancer  This type of cancer can be detected and can often be prevented.  Routine colorectal cancer screening usually begins at  age 42 and continues through age 45.  If you have risk factors for colon cancer, your health care provider may recommend that you be screened at an earlier age.  If you have a family history of colorectal cancer, talk with your health care provider about genetic screening.  Your health care provider may also recommend using home test kits to check for hidden blood in your stool.  A small camera at the end of a tube can be used to examine your colon directly (sigmoidoscopy or colonoscopy). This is done to check for the earliest forms of colorectal cancer.  Direct examination of the colon should be repeated every  5-10 years until age 71. However, if early forms of precancerous polyps or small growths are found or if you have a family history or genetic risk for colorectal cancer, you may need to be screened more often.  Skin Cancer  Check your skin from head to toe regularly.  Monitor any moles. Be sure to tell your health care provider: ? About any new moles or changes in moles, especially if there is a change in a mole's shape or color. ? If you have a mole that is larger than the size of a pencil eraser.  If any of your family members has a history of skin cancer, especially at a young age, talk with your health care provider about genetic screening.  Always use sunscreen. Apply sunscreen liberally and repeatedly throughout the day.  Whenever you are outside, protect yourself by wearing long sleeves, pants, a wide-brimmed hat, and sunglasses.  What should I know about osteoporosis? Osteoporosis is a condition in which bone destruction happens more quickly than new bone creation. After menopause, you may be at an increased risk for osteoporosis. To help prevent osteoporosis or the bone fractures that can happen because of osteoporosis, the following is recommended:  If you are 46-71 years old, get at least 1,000 mg of calcium and at least 600 mg of vitamin D per day.  If you are older than age 55 but younger than age 65, get at least 1,200 mg of calcium and at least 600 mg of vitamin D per day.  If you are older than age 54, get at least 1,200 mg of calcium and at least 800 mg of vitamin D per day.  Smoking and excessive alcohol intake increase the risk of osteoporosis. Eat foods that are rich in calcium and vitamin D, and do weight-bearing exercises several times each week as directed by your health care provider. What should I know about how menopause affects my mental health? Depression may occur at any age, but it is more common as you become older. Common symptoms of depression  include:  Low or sad mood.  Changes in sleep patterns.  Changes in appetite or eating patterns.  Feeling an overall lack of motivation or enjoyment of activities that you previously enjoyed.  Frequent crying spells.  Talk with your health care provider if you think that you are experiencing depression. What should I know about immunizations? It is important that you get and maintain your immunizations. These include:  Tetanus, diphtheria, and pertussis (Tdap) booster vaccine.  Influenza every year before the flu season begins.  Pneumonia vaccine.  Shingles vaccine.  Your health care provider may also recommend other immunizations. This information is not intended to replace advice given to you by your health care provider. Make sure you discuss any questions you have with your health care provider. Document Released: 11/29/2005  Document Revised: 04/26/2016 Document Reviewed: 07/11/2015 Elsevier Interactive Patient Education  2018 [employer]  

## 2017-07-24 ENCOUNTER — Telehealth: Payer: Self-pay | Admitting: Physician Assistant

## 2017-07-24 ENCOUNTER — Other Ambulatory Visit: Payer: Self-pay | Admitting: Physician Assistant

## 2017-07-24 DIAGNOSIS — J4541 Moderate persistent asthma with (acute) exacerbation: Secondary | ICD-10-CM

## 2017-07-24 DIAGNOSIS — J45991 Cough variant asthma: Secondary | ICD-10-CM

## 2017-07-24 MED ORDER — PREDNISONE 10 MG PO TABS: ORAL_TABLET | ORAL | 0 refills | Status: DC

## 2017-07-24 NOTE — Telephone Encounter (Signed)
Pt states she pharmacy has not rec'd the Rx for predniSONE (DELTASONE) 10 MG tablet.  Pt is asking if this can be resent to [facility].  CB#[phone]/MW

## 2017-07-24 NOTE — Telephone Encounter (Signed)
Resent

## 2017-07-24 NOTE — Telephone Encounter (Signed)
Please review-Latausha Flamm V Adreona Brand, RMA  

## 2017-07-31 ENCOUNTER — Ambulatory Visit: Payer: BLUE CROSS/BLUE SHIELD

## 2017-08-11 ENCOUNTER — Telehealth: Payer: Self-pay | Admitting: Physician Assistant

## 2017-08-11 NOTE — Telephone Encounter (Signed)
Order for cologuard faxed to [facility] °

## 2017-08-16 ENCOUNTER — Other Ambulatory Visit: Payer: Self-pay | Admitting: Physician Assistant

## 2017-08-16 DIAGNOSIS — K219 Gastro-esophageal reflux disease without esophagitis: Secondary | ICD-10-CM

## 2017-08-21 ENCOUNTER — Ambulatory Visit: Payer: BLUE CROSS/BLUE SHIELD

## 2017-09-02 ENCOUNTER — Ambulatory Visit: Payer: BLUE CROSS/BLUE SHIELD

## 2017-09-04 ENCOUNTER — Telehealth: Payer: Self-pay | Admitting: Physician Assistant

## 2017-09-04 NOTE — Telephone Encounter (Signed)
Insurance company would like to speak peer to peer to get CT of chest approved Phone [phone].Member # [id number].This test was approved some time ago and pt cancelled appointment and did not reschedule until after auth ran out.I see correspondence from the [facility] stating that we did not give them the size of the nodule,just FYI they were advised of this

## 2017-09-04 NOTE — Telephone Encounter (Signed)
Please review-Angela Reyes, RMA  

## 2017-09-04 NOTE — Telephone Encounter (Signed)
Spoke to peer to peer reviewer. He is going to email someone in the office and call the office back

## 2017-09-05 ENCOUNTER — Telehealth: Payer: Self-pay

## 2017-09-05 NOTE — Telephone Encounter (Signed)
See approval below

## 2017-09-05 NOTE — Telephone Encounter (Signed)
Representative from insurance called back and states CT of chest has been approved from 09/04/17-10/03/17. Order number is [id number]. Any questions you can call back to [place], RMA

## 2017-09-15 DIAGNOSIS — Z1212 Encounter for screening for malignant neoplasm of rectum: Secondary | ICD-10-CM | POA: Diagnosis not present

## 2017-09-15 DIAGNOSIS — Z1211 Encounter for screening for malignant neoplasm of colon: Secondary | ICD-10-CM | POA: Diagnosis not present

## 2017-09-18 ENCOUNTER — Ambulatory Visit (INDEPENDENT_AMBULATORY_CARE_PROVIDER_SITE_OTHER): Payer: BLUE CROSS/BLUE SHIELD | Admitting: Physician Assistant

## 2017-09-18 ENCOUNTER — Encounter: Payer: Self-pay | Admitting: Physician Assistant

## 2017-09-18 ENCOUNTER — Ambulatory Visit
Admission: RE | Admit: 2017-09-18 | Discharge: 2017-09-18 | Disposition: A | Discharge status: Home / Self Care | Payer: BLUE CROSS/BLUE SHIELD | Source: Ambulatory Visit | Attending: Physician Assistant | Admitting: Physician Assistant

## 2017-09-18 VITALS — BP 110/76 | HR 88 | Temp 98.1°F | Resp 20 | Wt 187.0 lb

## 2017-09-18 DIAGNOSIS — R911 Solitary pulmonary nodule: Secondary | ICD-10-CM

## 2017-09-18 DIAGNOSIS — J0140 Acute pansinusitis, unspecified: Secondary | ICD-10-CM | POA: Diagnosis not present

## 2017-09-18 DIAGNOSIS — J40 Bronchitis, not specified as acute or chronic: Secondary | ICD-10-CM

## 2017-09-18 DIAGNOSIS — R918 Other nonspecific abnormal finding of lung field: Secondary | ICD-10-CM | POA: Insufficient documentation

## 2017-09-18 MED ORDER — DOXYCYCLINE HYCLATE 100 MG PO TABS
100.0000 mg | ORAL_TABLET | Freq: Two times a day (BID) | ORAL | 0 refills | Status: DC
Start: 2017-09-18 — End: 2017-12-04

## 2017-09-18 MED ORDER — PREDNISONE 10 MG PO TABS: ORAL_TABLET | ORAL | 0 refills | Status: DC

## 2017-09-18 MED ORDER — HYDROCODONE-HOMATROPINE 5-1.5 MG/5ML PO SYRP: 5.0000 mL | ORAL_SOLUTION | Freq: Three times a day (TID) | ORAL | 0 refills | Status: DC | PRN

## 2017-09-18 NOTE — Progress Notes (Signed)
Patient advised and states she does feel that she is getting sick.  Patient made appointment for this afternoon.

## 2017-09-18 NOTE — Patient Instructions (Signed)

## 2017-09-18 NOTE — Progress Notes (Signed)
Patient: Angela Reyes Female    DOB: Feb 19, 1961   56 y.o.   MRN: [id number] Visit Date: 09/18/2017  Today's Provider: Mar Daring, PA-C   Chief Complaint  Patient presents with  . Bronchitis   Subjective:    HPI Upper Respiratory Infection: Patient complains of symptoms of a URI, possible sinusitis. Symptoms include bilateral ear pain, congestion and coryza. Onset of symptoms was 3 weeks ago, gradually worsening since that time. She also c/o congestion, nasal congestion and productive cough with  green colored sputum for the past 1 week .  She is drinking plenty of fluids. Evaluation to date: none. Treatment to date: cough suppressants and decongestants. Patient reports she has been using Mucinex DM and Robitussin. Patient did have a CT Scan done and was noted to have bronchitis. Patient reports she has been using Breo daily and albuterol inhaler as needed, last used this morning.  Also of note her sister passed 2 weeks ago, had been in hospice since June. Also her mother fell and broke her shoulder and father is wheelchair bound, thus she has been having to run around to help them as well.     No Known Allergies   Current Outpatient Medications:  .  albuterol (PROAIR HFA) 108 (90 Base) MCG/ACT inhaler, Inhale 2 puffs into the lungs every 6 (six) hours as needed for wheezing or shortness of breath., Disp: 1 Inhaler, Rfl: 5 .  albuterol (PROVENTIL) (2.5 MG/3ML) 0.083% nebulizer solution, Take 3 mLs (2.5 mg total) by nebulization every 6 (six) hours as needed for wheezing or shortness of breath., Disp: 75 mL, Rfl: 1 .  cetirizine (ZYRTEC) 10 MG tablet, Take 10 mg by mouth at bedtime. Reported on 05/01/2016, Disp: , Rfl:  .  esomeprazole (NEXIUM) 40 MG capsule, TAKE 1 CAPSULE (40 MG TOTAL) BY MOUTH DAILY AT 12 NOON., Disp: 90 capsule, Rfl: 1 .  fluticasone furoate-vilanterol (BREO ELLIPTA) 200-25 MCG/INH AEPB, Inhale 1 puff into the lungs every evening., Disp: 84 each, Rfl:  3 .  mometasone (NASONEX) 50 MCG/ACT nasal spray, Place 2 sprays into the nose daily. (Patient taking differently: Place 2 sprays into the nose daily as needed. ), Disp: 51 g, Rfl: 5 .  montelukast (SINGULAIR) 10 MG tablet, TAKE 1 TABLET (10 MG TOTAL) BY MOUTH AT BEDTIME., Disp: 90 tablet, Rfl: 3  Review of Systems  Constitutional: Negative.   HENT: Positive for congestion, ear pain, rhinorrhea, sinus pressure, sinus pain and sore throat. Negative for tinnitus and trouble swallowing.   Respiratory: Positive for cough, shortness of breath and wheezing.   Cardiovascular: Negative.   Gastrointestinal: Negative.   Neurological: Positive for headaches. Negative for dizziness and light-headedness.    Social History   Tobacco Use  . Smoking status: Never Smoker  . Smokeless tobacco: Never Used  Substance Use Topics  . Alcohol use: No   Objective:   BP 110/76 (BP Location: Left Arm, Patient Position: Sitting, Cuff Size: Large)   Pulse 88   Temp 98.1 F (36.7 C) (Oral)   Resp 20   Wt 187 lb (84.8 kg)   LMP 02/10/2015 Comment: 1st one since dec 2014  SpO2 93%   BMI 27.22 kg/m  Vitals:   09/18/17 1554  BP: 110/76  Pulse: 88  Resp: 20  Temp: 98.1 F (36.7 C)  TempSrc: Oral  SpO2: 93%  Weight: 187 lb (84.8 kg)     Physical Exam  Constitutional: She appears well-developed and well-nourished. No  distress.  HENT:  Head: Normocephalic and atraumatic.  Right Ear: Hearing, tympanic membrane, external ear and ear canal normal.  Left Ear: Hearing, tympanic membrane, external ear and ear canal normal.  Nose: Right sinus exhibits maxillary sinus tenderness and frontal sinus tenderness. Left sinus exhibits maxillary sinus tenderness and frontal sinus tenderness.  Mouth/Throat: Uvula is midline, oropharynx is clear and moist and mucous membranes are normal. No oropharyngeal exudate.  Neck: Normal range of motion. Neck supple. No tracheal deviation present. No thyromegaly present.    Cardiovascular: Normal rate, regular rhythm and normal heart sounds. Exam reveals no gallop and no friction rub.  No murmur heard. Pulmonary/Chest: Effort normal. No stridor. No respiratory distress. She has wheezes (expiratory throughout). She has no rales.  Lymphadenopathy:    She has no cervical adenopathy.  Skin: She is not diaphoretic.  Vitals reviewed.   CLINICAL DATA:  Follow-up nodules  EXAM: CT CHEST WITHOUT CONTRAST  TECHNIQUE: Multidetector CT imaging of the chest was performed following the standard protocol without IV contrast.  COMPARISON:  03/26/2016.  09/14/2013.  FINDINGS: Cardiovascular: Heart is normal size. Aorta is normal caliber.  Mediastinum/Nodes: No mediastinal, hilar, or axillary adenopathy.  Lungs/Pleura: Biapical scarring, stable. 5 mm perifissural nodule in the inferior right upper lobe is again noted, stable. Subpleural right lower lobe nodule on image 69 is 3 mm, stable. 5 mm right middle lobe nodule is stable. Previously seen 11 mm lingular nodule has nearly completely resolved. There is diffuse bronchial wall thickening noted, new since prior study, favor bronchitis. No effusions or confluent opacities. No new or enlarging pulmonary nodules.  Upper Abdomen: Imaging into the upper abdomen shows no acute findings.  Musculoskeletal: Chest wall soft tissues are unremarkable. No acute bony abnormality.  IMPRESSION: The previously seen 11 mm lingular nodule has nearly completely resolved compatible with resolving inflammatory process. The other nodules remains stable. Recommend follow-up in 12 months.  New diffuse bronchial wall thickening suggesting bronchitis.   Electronically Signed   By: Rolm Baptise M.D.   On: 09/18/2017 09:04     Assessment & Plan:     1. Bronchitis Will treat with doxycycline and prednisone as below. Hycodan given for nighttime cough. She is to call the office if symptoms worsen.  - predniSONE  (DELTASONE) 10 MG tablet; Take 6 tabs PO on day 1&2, 5 tabs PO on day 3&4, 4 tabs PO on day 5&6, 3 tabs PO on day 7&8, 2 tabs PO on day 9&10, 1 tab PO on day 11&12.  Dispense: 42 tablet; Refill: 0 - doxycycline (VIBRA-TABS) 100 MG tablet; Take 1 tablet (100 mg total) by mouth 2 (two) times daily.  Dispense: 20 tablet; Refill: 0 - HYDROcodone-homatropine (HYCODAN) 5-1.5 MG/5ML syrup; Take 5 mLs by mouth every 8 (eight) hours as needed for cough.  Dispense: 180 mL; Refill: 0  2. Acute pansinusitis, recurrence not specified See above medical treatment plan. - doxycycline (VIBRA-TABS) 100 MG tablet; Take 1 tablet (100 mg total) by mouth 2 (two) times daily.  Dispense: 20 tablet; Refill: 0       Mar Daring, PA-C  [facility]

## 2017-09-20 LAB — COLOGUARD
COLOGUARD: NEGATIVE
COLOGUARD: NEGATIVE

## 2017-09-23 ENCOUNTER — Telehealth: Payer: Self-pay

## 2017-09-23 NOTE — Telephone Encounter (Signed)
-----   Message from Mar Daring, [place] sent at 09/23/2017  8:37 AM EST ----- Please notify patient cologuard testing is negative.

## 2017-09-23 NOTE — Telephone Encounter (Signed)
Patient advised as directed below.  Thanks,  -Joseline 

## 2017-10-20 ENCOUNTER — Telehealth: Payer: Self-pay | Admitting: Physician Assistant

## 2017-10-20 DIAGNOSIS — J40 Bronchitis, not specified as acute or chronic: Secondary | ICD-10-CM

## 2017-10-20 MED ORDER — PREDNISONE 10 MG PO TABS: ORAL_TABLET | ORAL | 0 refills | Status: DC

## 2017-10-20 NOTE — Telephone Encounter (Signed)
Patient needs refill on Prednisone.  She is having problems with her asthma again. No appts available with any providers. Wants it called into [facility] on [address].

## 2017-10-20 NOTE — Telephone Encounter (Signed)
Sent in

## 2017-12-04 ENCOUNTER — Encounter: Payer: Self-pay | Admitting: Physician Assistant

## 2017-12-04 ENCOUNTER — Ambulatory Visit (INDEPENDENT_AMBULATORY_CARE_PROVIDER_SITE_OTHER): Payer: BLUE CROSS/BLUE SHIELD | Admitting: Physician Assistant

## 2017-12-04 DIAGNOSIS — J45991 Cough variant asthma: Secondary | ICD-10-CM | POA: Diagnosis not present

## 2017-12-04 DIAGNOSIS — J40 Bronchitis, not specified as acute or chronic: Secondary | ICD-10-CM

## 2017-12-04 DIAGNOSIS — J0140 Acute pansinusitis, unspecified: Secondary | ICD-10-CM | POA: Diagnosis not present

## 2017-12-04 MED ORDER — PREDNISONE 10 MG PO TABS: ORAL_TABLET | ORAL | 0 refills | Status: DC

## 2017-12-04 MED ORDER — DOXYCYCLINE HYCLATE 100 MG PO TABS: 100.0000 mg | ORAL_TABLET | Freq: Two times a day (BID) | ORAL | 0 refills | Status: DC

## 2017-12-04 MED ORDER — HYDROCODONE-HOMATROPINE 5-1.5 MG/5ML PO SYRP: 5.0000 mL | ORAL_SOLUTION | Freq: Three times a day (TID) | ORAL | 0 refills | Status: DC | PRN

## 2017-12-04 MED ORDER — ALBUTEROL SULFATE HFA 108 (90 BASE) MCG/ACT IN AERS: 2.0000 | INHALATION_SPRAY | Freq: Four times a day (QID) | RESPIRATORY_TRACT | 5 refills | Status: DC | PRN

## 2017-12-04 NOTE — Patient Instructions (Signed)
Acute Bronchitis, Adult Acute bronchitis is when air tubes (bronchi) in the lungs suddenly get swollen. The condition can make it hard to breathe. It can also cause these symptoms:  A cough.  Coughing up clear, yellow, or green mucus.  Wheezing.  Chest congestion.  Shortness of breath.  A fever.  Body aches.  Chills.  A sore throat.  Follow these instructions at home: Medicines  Take over-the-counter and prescription medicines only as told by your doctor.  If you were prescribed an antibiotic medicine, take it as told by your doctor. Do not stop taking the antibiotic even if you start to feel better. General instructions  Rest.  Drink enough fluids to keep your pee (urine) clear or pale yellow.  Avoid smoking and secondhand smoke. If you smoke and you need help quitting, ask your doctor. Quitting will help your lungs heal faster.  Use an inhaler, cool mist vaporizer, or humidifier as told by your doctor.  Keep all follow-up visits as told by your doctor. This is important. How is this prevented? To lower your risk of getting this condition again:  Wash your hands often with soap and water. If you cannot use soap and water, use hand sanitizer.  Avoid contact with people who have cold symptoms.  Try not to touch your hands to your mouth, nose, or eyes.  Make sure to get the flu shot every year.  Contact a doctor if:  Your symptoms do not get better in 2 weeks. Get help right away if:  You cough up blood.  You have chest pain.  You have very bad shortness of breath.  You become dehydrated.  You faint (pass out) or keep feeling like you are going to pass out.  You keep throwing up (vomiting).  You have a very bad headache.  Your fever or chills gets worse. This information is not intended to replace advice given to you by your health care provider. Make sure you discuss any questions you have with your health care provider. Document Released:  03/25/2008 Document Revised: 05/15/2016 Document Reviewed: 03/27/2016 Elsevier Interactive Patient Education  2018 Else[employer]   Sinusitis, Adult Sinusitis is soreness and inflammation of your sinuses. Sinuses are hollow spaces in the bones around your face. Your sinuses are located:  Around your eyes.  In the middle of your forehead.  Behind your nose.  In your cheekbones.  Your sinuses and nasal passages are lined with a stringy fluid (mucus). Mucus normally drains out of your sinuses. When your nasal tissues become inflamed or swollen, the mucus can become trapped or blocked so air cannot flow through your sinuses. This allows bacteria, viruses, and funguses to grow, which leads to infection. Sinusitis can develop quickly and last for 7?10 days (acute) or for more than 12 weeks (chronic). Sinusitis often develops after a cold. What are the causes? This condition is caused by anything that creates swelling in the sinuses or stops mucus from draining, including:  Allergies.  Asthma.  Bacterial or viral infection.  Abnormally shaped bones between the nasal passages.  Nasal growths that contain mucus (nasal polyps).  Narrow sinus openings.  Pollutants, such as chemicals or irritants in the air.  A foreign object stuck in the nose.  A fungal infection. This is rare.  What increases the risk? The following factors may make you more likely to develop this condition:  Having allergies or asthma.  Having had a recent cold or respiratory tract infection.  Having structural deformities or   blockages in your nose or sinuses.  Having a weak immune system.  Doing a lot of swimming or diving.  Overusing nasal sprays.  Smoking.  What are the signs or symptoms? The main symptoms of this condition are pain and a feeling of pressure around the affected sinuses. Other symptoms include:  Upper toothache.  Earache.  Headache.  Bad breath.  Decreased sense of smell and  taste.  A cough that may get worse at night.  Fatigue.  Fever.  Thick drainage from your nose. The drainage is often green and it may contain pus (purulent).  Stuffy nose or congestion.  Postnasal drip. This is when extra mucus collects in the throat or back of the nose.  Swelling and warmth over the affected sinuses.  Sore throat.  Sensitivity to light.  How is this diagnosed? This condition is diagnosed based on symptoms, a medical history, and a physical exam. To find out if your condition is acute or chronic, your health care provider may:  Look in your nose for signs of nasal polyps.  Tap over the affected sinus to check for signs of infection.  View the inside of your sinuses using an imaging device that has a light attached (endoscope).  If your health care provider suspects that you have chronic sinusitis, you may also:  Be tested for allergies.  Have a sample of mucus taken from your nose (nasal culture) and checked for bacteria.  Have a mucus sample examined to see if your sinusitis is related to an allergy.  If your sinusitis does not respond to treatment and it lasts longer than 8 weeks, you may have an MRI or CT scan to check your sinuses. These scans also help to determine how severe your infection is. In rare cases, a bone biopsy may be done to rule out more serious types of fungal sinus disease. How is this treated? Treatment for sinusitis depends on the cause and whether your condition is chronic or acute. If a virus is causing your sinusitis, your symptoms will go away on their own within 10 days. You may be given medicines to relieve your symptoms, including:  Topical nasal decongestants. They shrink swollen nasal passages and let mucus drain from your sinuses.  Antihistamines. These drugs block inflammation that is triggered by allergies. This can help to ease swelling in your nose and sinuses.  Topical nasal corticosteroids. These are nasal sprays  that ease inflammation and swelling in your nose and sinuses.  Nasal saline washes. These rinses can help to get rid of thick mucus in your nose.  If your condition is caused by bacteria, you will be given an antibiotic medicine. If your condition is caused by a fungus, you will be given an antifungal medicine. Surgery may be needed to correct underlying conditions, such as narrow nasal passages. Surgery may also be needed to remove polyps. Follow these instructions at home: Medicines  Take, use, or apply over-the-counter and prescription medicines only as told by your health care provider. These may include nasal sprays.  If you were prescribed an antibiotic medicine, take it as told by your health care provider. Do not stop taking the antibiotic even if you start to feel better. Hydrate and Humidify  Drink enough water to keep your urine clear or pale yellow. Staying hydrated will help to thin your mucus.  Use a cool mist humidifier to keep the humidity level in your home above 50%.  Inhale steam for 10-15 minutes, 3-4 times a day   or as told by your health care provider. You can do this in the bathroom while a hot shower is running.  Limit your exposure to cool or dry air. Rest  Rest as much as possible.  Sleep with your head raised (elevated).  Make sure to get enough sleep each night. General instructions  Apply a warm, moist washcloth to your face 3-4 times a day or as told by your health care provider. This will help with discomfort.  Wash your hands often with soap and water to reduce your exposure to viruses and other germs. If soap and water are not available, use hand sanitizer.  Do not smoke. Avoid being around people who are smoking (secondhand smoke).  Keep all follow-up visits as told by your health care provider. This is important. Contact a health care provider if:  You have a fever.  Your symptoms get worse.  Your symptoms do not improve within 10  days. Get help right away if:  You have a severe headache.  You have persistent vomiting.  You have pain or swelling around your face or eyes.  You have vision problems.  You develop confusion.  Your neck is stiff.  You have trouble breathing. This information is not intended to replace advice given to you by your health care provider. Make sure you discuss any questions you have with your health care provider. Document Released: 10/07/2005 Document Revised: 06/02/2016 Document Reviewed: 08/02/2015 Elsevier Interactive Patient Education  2018 Elsevier Inc.  

## 2017-12-04 NOTE — Progress Notes (Signed)
Patient: Angela Reyes Female    DOB: 05-07-1961   57 y.o.   MRN: [id number] Visit Date: 12/04/2017  Today's Provider: Mar Daring, PA-C   Chief Complaint  Patient presents with  . URI   Subjective:    URI   This is a new problem. The current episode started 1 to 4 weeks ago (Last week). The problem has been gradually worsening. There has been no fever. Associated symptoms include congestion, coughing, ear pain (aching, bilateral), rhinorrhea, sinus pain and wheezing. Pertinent negatives include no chest pain or sore throat. She has tried decongestant and inhaler use (Mucinex, breating treatment and inhaler.Last breatihing treatment was last night. Her rescue inhaler was around 11:00am) for the symptoms. The treatment provided no relief.      No Known Allergies   Current Outpatient Medications:  .  albuterol (PROAIR HFA) 108 (90 Base) MCG/ACT inhaler, Inhale 2 puffs into the lungs every 6 (six) hours as needed for wheezing or shortness of breath., Disp: 1 Inhaler, Rfl: 5 .  albuterol (PROVENTIL) (2.5 MG/3ML) 0.083% nebulizer solution, Take 3 mLs (2.5 mg total) by nebulization every 6 (six) hours as needed for wheezing or shortness of breath., Disp: 75 mL, Rfl: 1 .  cetirizine (ZYRTEC) 10 MG tablet, Take 10 mg by mouth at bedtime. Reported on 05/01/2016, Disp: , Rfl:  .  esomeprazole (NEXIUM) 40 MG capsule, TAKE 1 CAPSULE (40 MG TOTAL) BY MOUTH DAILY AT 12 NOON., Disp: 90 capsule, Rfl: 1 .  fluticasone furoate-vilanterol (BREO ELLIPTA) 200-25 MCG/INH AEPB, Inhale 1 puff into the lungs every evening., Disp: 84 each, Rfl: 3 .  mometasone (NASONEX) 50 MCG/ACT nasal spray, Place 2 sprays into the nose daily. (Patient taking differently: Place 2 sprays into the nose daily as needed. ), Disp: 51 g, Rfl: 5 .  montelukast (SINGULAIR) 10 MG tablet, TAKE 1 TABLET (10 MG TOTAL) BY MOUTH AT BEDTIME., Disp: 90 tablet, Rfl: 3 .  doxycycline (VIBRA-TABS) 100 MG tablet, Take 1 tablet (100  mg total) by mouth 2 (two) times daily. (Patient not taking: Reported on 12/04/2017), Disp: 20 tablet, Rfl: 0 .  HYDROcodone-homatropine (HYCODAN) 5-1.5 MG/5ML syrup, Take 5 mLs by mouth every 8 (eight) hours as needed for cough. (Patient not taking: Reported on 12/04/2017), Disp: 180 mL, Rfl: 0 .  predniSONE (DELTASONE) 10 MG tablet, Take 6 tabs PO on day 1&2, 5 tabs PO on day 3&4, 4 tabs PO on day 5&6, 3 tabs PO on day 7&8, 2 tabs PO on day 9&10, 1 tab PO on day 11&12. (Patient not taking: Reported on 12/04/2017), Disp: 42 tablet, Rfl: 0  Review of Systems  Constitutional: Negative for fever.  HENT: Positive for congestion, ear pain (aching, bilateral), postnasal drip, rhinorrhea, sinus pressure, sinus pain and voice change. Negative for sore throat.   Respiratory: Positive for cough, chest tightness, shortness of breath and wheezing.   Cardiovascular: Negative for chest pain, palpitations and leg swelling.  Gastrointestinal: Negative.   Neurological: Negative.     Social History   Tobacco Use  . Smoking status: Never Smoker  . Smokeless tobacco: Never Used  Substance Use Topics  . Alcohol use: No   Objective:   BP 118/80 (BP Location: Left Arm, Patient Position: Sitting, Cuff Size: Normal)   Pulse 93   Temp 97.6 F (36.4 C) (Oral)   Resp 20   Wt 186 lb 6.4 oz (84.6 kg)   LMP 02/10/2015 Comment: 1st one since dec 2014  BMI 27.13 kg/m  Vitals:   12/04/17 1628  BP: 118/80  Pulse: 93  Resp: 20  Temp: 97.6 F (36.4 C)  TempSrc: Oral  Weight: 186 lb 6.4 oz (84.6 kg)     Physical Exam  Constitutional: She appears well-developed and well-nourished. No distress.  HENT:  Head: Normocephalic and atraumatic.  Right Ear: Hearing, tympanic membrane, external ear and ear canal normal.  Left Ear: Hearing, tympanic membrane, external ear and ear canal normal.  Nose: Right sinus exhibits maxillary sinus tenderness and frontal sinus tenderness. Left sinus exhibits maxillary sinus  tenderness and frontal sinus tenderness.  Mouth/Throat: Uvula is midline, oropharynx is clear and moist and mucous membranes are normal. No oropharyngeal exudate.  Eyes: Conjunctivae are normal. Pupils are equal, round, and reactive to light. Right eye exhibits no discharge. Left eye exhibits no discharge. No scleral icterus.  Neck: Normal range of motion. Neck supple. No tracheal deviation present. No thyromegaly present.  Cardiovascular: Normal rate, regular rhythm and normal heart sounds. Exam reveals no gallop and no friction rub.  No murmur heard. Pulmonary/Chest: No stridor. She is in respiratory distress. She has wheezes. She has no rales.  Lymphadenopathy:    She has no cervical adenopathy.  Skin: Skin is warm and dry. She is not diaphoretic.  Vitals reviewed.      Assessment & Plan:     1. Bronchitis Worsening. Will treat with oral prednisone as patient has not been responding to her inhalers and nebulizer treatments. Will also give doxycycline and cough medication as below. She needs to push fluids. Continue her asthma medications. Call if no improvements.  - HYDROcodone-homatropine (HYCODAN) 5-1.5 MG/5ML syrup; Take 5 mLs by mouth every 8 (eight) hours as needed for cough.  Dispense: 180 mL; Refill: 0 - predniSONE (DELTASONE) 10 MG tablet; Take 6 tabs PO on day 1&2, 5 tabs PO on day 3&4, 4 tabs PO on day 5&6, 3 tabs PO on day 7&8, 2 tabs PO on day 9&10, 1 tab PO on day 11&12.  Dispense: 42 tablet; Refill: 0 - doxycycline (VIBRA-TABS) 100 MG tablet; Take 1 tablet (100 mg total) by mouth 2 (two) times daily.  Dispense: 20 tablet; Refill: 0  2. Asthma, cough variant Stable. Diagnosis pulled for medication refill. Continue current medical treatment plan. - albuterol (PROAIR HFA) 108 (90 Base) MCG/ACT inhaler; Inhale 2 puffs into the lungs every 6 (six) hours as needed for wheezing or shortness of breath.  Dispense: 1 Inhaler; Refill: 5  3. Acute pansinusitis, recurrence not  specified See above medical treatment plan.  - doxycycline (VIBRA-TABS) 100 MG tablet; Take 1 tablet (100 mg total) by mouth 2 (two) times daily.  Dispense: 20 tablet; Refill: 0       Mar Daring, PA-C  [facility]

## 2017-12-16 ENCOUNTER — Other Ambulatory Visit: Payer: Self-pay | Admitting: Physician Assistant

## 2017-12-16 DIAGNOSIS — J40 Bronchitis, not specified as acute or chronic: Secondary | ICD-10-CM

## 2017-12-16 NOTE — Telephone Encounter (Signed)
Pt contacted office for refill request on the following medications:  predniSONE (DELTASONE) 10 MG tablet  [facility]  Pt stated she completed the first dose yesterday but she feels she needs another round of the medication. Please advise. Thanks TNP

## 2017-12-17 MED ORDER — PREDNISONE 10 MG PO TABS
ORAL_TABLET | ORAL | 0 refills | Status: DC
Start: 2017-12-17 — End: 2018-02-26

## 2017-12-18 ENCOUNTER — Other Ambulatory Visit: Payer: Self-pay | Admitting: Physician Assistant

## 2017-12-18 DIAGNOSIS — J309 Allergic rhinitis, unspecified: Secondary | ICD-10-CM

## 2018-01-07 DIAGNOSIS — L821 Other seborrheic keratosis: Secondary | ICD-10-CM | POA: Diagnosis not present

## 2018-01-07 DIAGNOSIS — L812 Freckles: Secondary | ICD-10-CM | POA: Diagnosis not present

## 2018-01-07 DIAGNOSIS — L578 Other skin changes due to chronic exposure to nonionizing radiation: Secondary | ICD-10-CM | POA: Diagnosis not present

## 2018-01-07 DIAGNOSIS — D2261 Melanocytic nevi of right upper limb, including shoulder: Secondary | ICD-10-CM | POA: Diagnosis not present

## 2018-01-07 DIAGNOSIS — D485 Neoplasm of uncertain behavior of skin: Secondary | ICD-10-CM | POA: Diagnosis not present

## 2018-01-07 DIAGNOSIS — Z1283 Encounter for screening for malignant neoplasm of skin: Secondary | ICD-10-CM | POA: Diagnosis not present

## 2018-02-26 ENCOUNTER — Encounter: Payer: Self-pay | Admitting: Physician Assistant

## 2018-02-26 ENCOUNTER — Ambulatory Visit: Payer: BLUE CROSS/BLUE SHIELD | Admitting: Physician Assistant

## 2018-02-26 VITALS — BP 142/92 | HR 89 | Temp 98.5°F | Wt 185.0 lb

## 2018-02-26 DIAGNOSIS — K219 Gastro-esophageal reflux disease without esophagitis: Secondary | ICD-10-CM | POA: Diagnosis not present

## 2018-02-26 DIAGNOSIS — J4541 Moderate persistent asthma with (acute) exacerbation: Secondary | ICD-10-CM

## 2018-02-26 DIAGNOSIS — J301 Allergic rhinitis due to pollen: Secondary | ICD-10-CM

## 2018-02-26 DIAGNOSIS — J0140 Acute pansinusitis, unspecified: Secondary | ICD-10-CM | POA: Diagnosis not present

## 2018-02-26 DIAGNOSIS — J4540 Moderate persistent asthma, uncomplicated: Secondary | ICD-10-CM

## 2018-02-26 MED ORDER — HYDROCODONE-HOMATROPINE 5-1.5 MG/5ML PO SYRP: 5.0000 mL | ORAL_SOLUTION | Freq: Three times a day (TID) | ORAL | 0 refills | Status: DC | PRN

## 2018-02-26 MED ORDER — MOMETASONE FUROATE 50 MCG/ACT NA SUSP: 2.0000 | Freq: Every day | NASAL | 5 refills | Status: DC | PRN

## 2018-02-26 MED ORDER — PREDNISONE 10 MG PO TABS
ORAL_TABLET | ORAL | 0 refills | Status: DC
Start: 2018-02-26 — End: 2018-04-17

## 2018-02-26 MED ORDER — DOXYCYCLINE HYCLATE 100 MG PO TABS: 100.0000 mg | ORAL_TABLET | Freq: Two times a day (BID) | ORAL | 0 refills | Status: DC

## 2018-02-26 MED ORDER — FLUTICASONE FUROATE-VILANTEROL 200-25 MCG/INH IN AEPB: 1.0000 | INHALATION_SPRAY | Freq: Every evening | RESPIRATORY_TRACT | 3 refills | Status: DC

## 2018-02-26 MED ORDER — ESOMEPRAZOLE MAGNESIUM 40 MG PO CPDR: DELAYED_RELEASE_CAPSULE | ORAL | 3 refills | Status: DC

## 2018-02-26 MED ORDER — ALBUTEROL SULFATE (2.5 MG/3ML) 0.083% IN NEBU: 2.5000 mg | INHALATION_SOLUTION | Freq: Four times a day (QID) | RESPIRATORY_TRACT | 1 refills | Status: DC | PRN

## 2018-02-26 NOTE — Progress Notes (Signed)
Patient: Angela Reyes Female    DOB: 1961-01-31   57 y.o.   MRN: [id number] Visit Date: 02/26/2018  Today's Provider: Mar Daring, PA-C   Chief Complaint  Patient presents with  . Cough   Subjective:    Cough  This is a new problem. The cough is non-productive. Associated symptoms include chest pain, ear pain, headaches, nasal congestion and shortness of breath. Pertinent negatives include no chills or fever. She has tried steroid inhaler, oral steroids and OTC cough suppressant for the symptoms. The treatment provided no relief.      No Known Allergies   Current Outpatient Medications:  .  albuterol (PROAIR HFA) 108 (90 Base) MCG/ACT inhaler, Inhale 2 puffs into the lungs every 6 (six) hours as needed for wheezing or shortness of breath., Disp: 1 Inhaler, Rfl: 5 .  albuterol (PROVENTIL) (2.5 MG/3ML) 0.083% nebulizer solution, Take 3 mLs (2.5 mg total) by nebulization every 6 (six) hours as needed for wheezing or shortness of breath., Disp: 75 mL, Rfl: 1 .  cetirizine (ZYRTEC) 10 MG tablet, Take 10 mg by mouth at bedtime. Reported on 05/01/2016, Disp: , Rfl:  .  esomeprazole (NEXIUM) 40 MG capsule, TAKE 1 CAPSULE (40 MG TOTAL) BY MOUTH DAILY AT 12 NOON., Disp: 90 capsule, Rfl: 1 .  fluticasone furoate-vilanterol (BREO ELLIPTA) 200-25 MCG/INH AEPB, Inhale 1 puff into the lungs every evening., Disp: 84 each, Rfl: 3 .  mometasone (NASONEX) 50 MCG/ACT nasal spray, Place 2 sprays into the nose daily. (Patient taking differently: Place 2 sprays into the nose daily as needed. ), Disp: 51 g, Rfl: 5 .  montelukast (SINGULAIR) 10 MG tablet, TAKE 1 TABLET (10 MG TOTAL) BY MOUTH AT BEDTIME., Disp: 90 tablet, Rfl: 2 .  HYDROcodone-homatropine (HYCODAN) 5-1.5 MG/5ML syrup, Take 5 mLs by mouth every 8 (eight) hours as needed for cough. (Patient not taking: Reported on 02/26/2018), Disp: 180 mL, Rfl: 0  Review of Systems  Constitutional: Negative for chills and fever.  HENT: Positive  for ear pain.   Respiratory: Positive for cough and shortness of breath.   Cardiovascular: Positive for chest pain.  Neurological: Positive for headaches.    Social History   Tobacco Use  . Smoking status: Never Smoker  . Smokeless tobacco: Never Used  Substance Use Topics  . Alcohol use: No   Objective:   BP (!) 142/92 (BP Location: Left Arm, Patient Position: Sitting, Cuff Size: Normal)   Pulse 89   Temp 98.5 F (36.9 C) (Oral)   Wt 185 lb (83.9 kg)   LMP 02/10/2015 Comment: 1st one since dec 2014  SpO2 96%   BMI 26.93 kg/m  Vitals:   02/26/18 1322  BP: (!) 142/92  Pulse: 89  Temp: 98.5 F (36.9 C)  TempSrc: Oral  SpO2: 96%  Weight: 185 lb (83.9 kg)     Physical Exam  Constitutional: She appears well-developed and well-nourished. No distress.  HENT:  Head: Normocephalic and atraumatic.  Right Ear: Hearing, tympanic membrane, external ear and ear canal normal.  Left Ear: Hearing, tympanic membrane, external ear and ear canal normal.  Nose: Right sinus exhibits maxillary sinus tenderness and frontal sinus tenderness. Left sinus exhibits maxillary sinus tenderness and frontal sinus tenderness.  Mouth/Throat: Uvula is midline, oropharynx is clear and moist and mucous membranes are normal. No oropharyngeal exudate.  Neck: Normal range of motion. Neck supple. No tracheal deviation present. No thyromegaly present.  Cardiovascular: Normal rate, regular rhythm and normal  heart sounds. Exam reveals no gallop and no friction rub.  No murmur heard. Pulmonary/Chest: Effort normal. No stridor. No respiratory distress. She has wheezes (inspiratory and expiratory throughout). She has no rales.  Lymphadenopathy:    She has no cervical adenopathy.  Skin: She is not diaphoretic.  Vitals reviewed.      Assessment & Plan:     1. Moderate persistent asthma with acute exacerbation Worsening despite treatment with inhalers and allergy medications. Will give prednisone, doxycycline  and Hycodan cough syrup as below.  - predniSONE (DELTASONE) 10 MG tablet; Take 6 tabs PO on day 1&2, 5 tabs PO on day 3&4, 4 tabs PO on day 5&6, 3 tabs PO on day 7&8, 2 tabs PO on day 9&10, 1 tab PO on day 11&12.  Dispense: 42 tablet; Refill: 0 - HYDROcodone-homatropine (HYCODAN) 5-1.5 MG/5ML syrup; Take 5 mLs by mouth every 8 (eight) hours as needed for cough.  Dispense: 180 mL; Refill: 0 - doxycycline (VIBRA-TABS) 100 MG tablet; Take 1 tablet (100 mg total) by mouth 2 (two) times daily.  Dispense: 20 tablet; Refill: 0  2. Acute pansinusitis, recurrence not specified See above medical treatment plan. - doxycycline (VIBRA-TABS) 100 MG tablet; Take 1 tablet (100 mg total) by mouth 2 (two) times daily.  Dispense: 20 tablet; Refill: 0  3. Allergic rhinitis Stable. Diagnosis pulled for medication refill. Continue current medical treatment plan. - mometasone (NASONEX) 50 MCG/ACT nasal spray; Place 2 sprays into the nose daily as needed.  Dispense: 51 g; Refill: 5  4. Moderate persistent asthma without complication Stable. Diagnosis pulled for medication refill. Continue current medical treatment plan. - fluticasone furoate-vilanterol (BREO ELLIPTA) 200-25 MCG/INH AEPB; Inhale 1 puff into the lungs every evening.  Dispense: 84 each; Refill: 3 - albuterol (PROVENTIL) (2.5 MG/3ML) 0.083% nebulizer solution; Take 3 mLs (2.5 mg total) by nebulization every 6 (six) hours as needed for wheezing or shortness of breath.  Dispense: 75 mL; Refill: 1  5. Gastroesophageal reflux disease, esophagitis presence not specified Stable. Diagnosis pulled for medication refill. Continue current medical treatment plan. - esomeprazole (NEXIUM) 40 MG capsule; TAKE 1 CAPSULE (40 MG TOTAL) BY MOUTH DAILY AT 12 NOON.  Dispense: 90 capsule; Refill: [place], PA-C  [facility]

## 2018-04-17 ENCOUNTER — Encounter: Payer: Self-pay | Admitting: Physician Assistant

## 2018-04-17 ENCOUNTER — Ambulatory Visit: Payer: BLUE CROSS/BLUE SHIELD | Admitting: Physician Assistant

## 2018-04-17 DIAGNOSIS — J4541 Moderate persistent asthma with (acute) exacerbation: Secondary | ICD-10-CM | POA: Diagnosis not present

## 2018-04-17 DIAGNOSIS — J0140 Acute pansinusitis, unspecified: Secondary | ICD-10-CM | POA: Diagnosis not present

## 2018-04-17 MED ORDER — PREDNISONE 10 MG PO TABS: ORAL_TABLET | ORAL | 0 refills | Status: DC

## 2018-04-17 MED ORDER — DOXYCYCLINE HYCLATE 100 MG PO TABS: 100.0000 mg | ORAL_TABLET | Freq: Two times a day (BID) | ORAL | 0 refills | Status: DC

## 2018-04-17 NOTE — Progress Notes (Signed)
Patient: Angela Reyes Female    DOB: 1961-06-11   57 y.o.   MRN: [id number] Visit Date: 04/17/2018  Today's Provider: Mar Daring, PA-C   Chief Complaint  Patient presents with  . Cough   Subjective:    Cough  This is a new problem. The current episode started 1 to 4 weeks ago (1 week ago). The problem has been gradually worsening. The problem occurs every few hours. The cough is productive of sputum. Associated symptoms include nasal congestion, postnasal drip, rhinorrhea, shortness of breath and wheezing ("a little"). Pertinent negatives include no chest pain, chills, ear congestion, ear pain, fever, headaches or sore throat. The symptoms are aggravated by other ("laughing"). She has tried ipratropium inhaler (Zyrtec,nasal spray) for the symptoms. The treatment provided no relief.      No Known Allergies   Current Outpatient Medications:  .  albuterol (PROAIR HFA) 108 (90 Base) MCG/ACT inhaler, Inhale 2 puffs into the lungs every 6 (six) hours as needed for wheezing or shortness of breath., Disp: 1 Inhaler, Rfl: 5 .  albuterol (PROVENTIL) (2.5 MG/3ML) 0.083% nebulizer solution, Take 3 mLs (2.5 mg total) by nebulization every 6 (six) hours as needed for wheezing or shortness of breath., Disp: 75 mL, Rfl: 1 .  cetirizine (ZYRTEC) 10 MG tablet, Take 10 mg by mouth at bedtime. Reported on 05/01/2016, Disp: , Rfl:  .  esomeprazole (NEXIUM) 40 MG capsule, TAKE 1 CAPSULE (40 MG TOTAL) BY MOUTH DAILY AT 12 NOON., Disp: 90 capsule, Rfl: 3 .  fluticasone furoate-vilanterol (BREO ELLIPTA) 200-25 MCG/INH AEPB, Inhale 1 puff into the lungs every evening., Disp: 84 each, Rfl: 3 .  mometasone (NASONEX) 50 MCG/ACT nasal spray, Place 2 sprays into the nose daily as needed., Disp: 51 g, Rfl: 5 .  montelukast (SINGULAIR) 10 MG tablet, TAKE 1 TABLET (10 MG TOTAL) BY MOUTH AT BEDTIME., Disp: 90 tablet, Rfl: 2 .  HYDROcodone-homatropine (HYCODAN) 5-1.5 MG/5ML syrup, Take 5 mLs by mouth  every 8 (eight) hours as needed for cough. (Patient not taking: Reported on 04/17/2018), Disp: 180 mL, Rfl: 0 .  predniSONE (DELTASONE) 10 MG tablet, Take 6 tabs PO on day 1&2, 5 tabs PO on day 3&4, 4 tabs PO on day 5&6, 3 tabs PO on day 7&8, 2 tabs PO on day 9&10, 1 tab PO on day 11&12. (Patient not taking: Reported on 04/17/2018), Disp: 42 tablet, Rfl: 0  Review of Systems  Constitutional: Negative for chills and fever.  HENT: Positive for congestion, postnasal drip, rhinorrhea and sinus pressure. Negative for ear pain and sore throat.   Respiratory: Positive for cough, chest tightness, shortness of breath and wheezing ("a little").   Cardiovascular: Negative for chest pain.  Neurological: Negative for headaches.    Social History   Tobacco Use  . Smoking status: Never Smoker  . Smokeless tobacco: Never Used  Substance Use Topics  . Alcohol use: No   Objective:   BP 140/90 (BP Location: Left Arm, Patient Position: Sitting, Cuff Size: Normal)   Pulse 88   Temp 97.9 F (36.6 C) (Oral)   Resp 16   Wt 189 lb 9.6 oz (86 kg)   LMP 02/10/2015 Comment: 1st one since dec 2014  SpO2 97%   BMI 27.60 kg/m  Vitals:   04/17/18 0940  BP: 140/90  Pulse: 88  Resp: 16  Temp: 97.9 F (36.6 C)  TempSrc: Oral  SpO2: 97%  Weight: 189 lb 9.6 oz (86 kg)  Physical Exam  Constitutional: She appears well-developed and well-nourished. No distress.  HENT:  Head: Normocephalic and atraumatic.  Right Ear: Hearing, tympanic membrane, external ear and ear canal normal.  Left Ear: Hearing, tympanic membrane, external ear and ear canal normal.  Nose: Right sinus exhibits maxillary sinus tenderness and frontal sinus tenderness. Left sinus exhibits maxillary sinus tenderness and frontal sinus tenderness.  Mouth/Throat: Uvula is midline, oropharynx is clear and moist and mucous membranes are normal. No oropharyngeal exudate.  Eyes: Pupils are equal, round, and reactive to light. Conjunctivae are  normal. Right eye exhibits no discharge. Left eye exhibits no discharge. No scleral icterus.  Neck: Normal range of motion. Neck supple. No tracheal deviation present. No thyromegaly present.  Cardiovascular: Normal rate, regular rhythm and normal heart sounds. Exam reveals no gallop and no friction rub.  No murmur heard. Pulmonary/Chest: Effort normal. No stridor. No respiratory distress. She has wheezes. She has no rales.  Lymphadenopathy:    She has no cervical adenopathy.  Skin: Skin is warm and dry. She is not diaphoretic.  Vitals reviewed.       Assessment & Plan:     1. Moderate persistent asthma with acute exacerbation Worsening symptoms that have not responded to OTC medications. Will give doxycycline and prednisone as below. Continue allergy medications. Stay well hydrated and get plenty of rest. Call if no symptom improvement or if symptoms worsen. - predniSONE (DELTASONE) 10 MG tablet; Take 6 tabs PO on day 1&2, 5 tabs PO on day 3&4, 4 tabs PO on day 5&6, 3 tabs PO on day 7&8, 2 tabs PO on day 9&10, 1 tab PO on day 11&12.  Dispense: 42 tablet; Refill: 0 - doxycycline (VIBRA-TABS) 100 MG tablet; Take 1 tablet (100 mg total) by mouth 2 (two) times daily.  Dispense: 20 tablet; Refill: 0  2. Acute pansinusitis, recurrence not specified - doxycycline (VIBRA-TABS) 100 MG tablet; Take 1 tablet (100 mg total) by mouth 2 (two) times daily.  Dispense: 20 tablet; Refill: 0       Mar Daring, PA-C  [facility]

## 2018-04-24 ENCOUNTER — Encounter: Payer: Self-pay | Admitting: Physician Assistant

## 2018-06-25 ENCOUNTER — Encounter: Payer: Self-pay | Admitting: Family Medicine

## 2018-06-25 ENCOUNTER — Ambulatory Visit: Payer: BLUE CROSS/BLUE SHIELD | Admitting: Family Medicine

## 2018-06-25 VITALS — BP 142/92 | HR 89 | Temp 97.9°F | Wt 192.6 lb

## 2018-06-25 DIAGNOSIS — J0141 Acute recurrent pansinusitis: Secondary | ICD-10-CM

## 2018-06-25 DIAGNOSIS — J4551 Severe persistent asthma with (acute) exacerbation: Secondary | ICD-10-CM

## 2018-06-25 MED ORDER — PREDNISONE 10 MG PO TABS: ORAL_TABLET | ORAL | 0 refills | Status: DC

## 2018-06-25 MED ORDER — FLUCONAZOLE 150 MG PO TABS: 150.0000 mg | ORAL_TABLET | Freq: Once | ORAL | 0 refills | Status: AC

## 2018-06-25 MED ORDER — AMOXICILLIN-POT CLAVULANATE 875-125 MG PO TABS: 1.0000 | ORAL_TABLET | Freq: Two times a day (BID) | ORAL | 0 refills | Status: AC

## 2018-06-25 NOTE — Patient Instructions (Signed)

## 2018-06-25 NOTE — Progress Notes (Signed)
Patient: Angela Reyes Female    DOB: 08/08/1961   57 y.o.   MRN: [id number] Visit Date: 06/25/2018  Today's Provider: Lavon Paganini, MD   Chief Complaint  Patient presents with  . URI   Subjective:    I, Tiburcio Pea, CMA, am acting as a scribe for Lavon Paganini, MD.   URI   This is a new problem. Episode onset: 10 days. The problem has been gradually worsening. There has been no fever. Associated symptoms include congestion, coughing, ear pain, rhinorrhea and a sore throat. Associated symptoms comments: Postnasal drainage. SOB, eyes watery . Treatments tried: OTC allergy medication, Mucinex D  The treatment provided no relief.  Patient has a medical history of Asthma and Allergies.  Typically gets an asthma exacerbation after having URI symptoms. Last treated for sinusitis and asthma exacerbation in June 2019.     No Known Allergies   Current Outpatient Medications:  .  albuterol (PROAIR HFA) 108 (90 Base) MCG/ACT inhaler, Inhale 2 puffs into the lungs every 6 (six) hours as needed for wheezing or shortness of breath., Disp: 1 Inhaler, Rfl: 5 .  albuterol (PROVENTIL) (2.5 MG/3ML) 0.083% nebulizer solution, Take 3 mLs (2.5 mg total) by nebulization every 6 (six) hours as needed for wheezing or shortness of breath., Disp: 75 mL, Rfl: 1 .  cetirizine (ZYRTEC) 10 MG tablet, Take 10 mg by mouth at bedtime. Reported on 05/01/2016, Disp: , Rfl:  .  esomeprazole (NEXIUM) 40 MG capsule, TAKE 1 CAPSULE (40 MG TOTAL) BY MOUTH DAILY AT 12 NOON., Disp: 90 capsule, Rfl: 3 .  fluticasone furoate-vilanterol (BREO ELLIPTA) 200-25 MCG/INH AEPB, Inhale 1 puff into the lungs every evening., Disp: 84 each, Rfl: 3 .  mometasone (NASONEX) 50 MCG/ACT nasal spray, Place 2 sprays into the nose daily as needed., Disp: 51 g, Rfl: 5 .  montelukast (SINGULAIR) 10 MG tablet, TAKE 1 TABLET (10 MG TOTAL) BY MOUTH AT BEDTIME., Disp: 90 tablet, Rfl: 2  Review of Systems  Constitutional: Negative.     HENT: Positive for congestion, ear pain, rhinorrhea and sore throat.   Eyes: Positive for discharge.  Respiratory: Positive for cough and shortness of breath.   Cardiovascular: Negative.   Gastrointestinal: Negative.   Endocrine: Negative.   Genitourinary: Negative.   Musculoskeletal: Negative.   Skin: Negative.   Allergic/Immunologic: Negative.   Neurological: Negative.   Hematological: Negative.   Psychiatric/Behavioral: Negative.     Social History   Tobacco Use  . Smoking status: Never Smoker  . Smokeless tobacco: Never Used  Substance Use Topics  . Alcohol use: No   Objective:   BP (!) 142/92 (BP Location: Right Arm, Patient Position: Sitting, Cuff Size: Large)   Pulse 89   Temp 97.9 F (36.6 C) (Oral)   Wt 192 lb 9.6 oz (87.4 kg)   LMP 02/10/2015 Comment: 1st one since dec 2014  SpO2 96%   BMI 28.03 kg/m  Vitals:   06/25/18 1551  BP: (!) 142/92  Pulse: 89  Temp: 97.9 F (36.6 C)  TempSrc: Oral  SpO2: 96%  Weight: 192 lb 9.6 oz (87.4 kg)     Physical Exam  Constitutional: She is oriented to person, place, and time. She appears well-developed and well-nourished. No distress.  HENT:  Head: Normocephalic and atraumatic.  Right Ear: Tympanic membrane, external ear and ear canal normal.  Left Ear: Tympanic membrane, external ear and ear canal normal.  Nose: Mucosal edema present. Right sinus exhibits maxillary sinus tenderness  and frontal sinus tenderness. Left sinus exhibits maxillary sinus tenderness and frontal sinus tenderness.  Mouth/Throat: Uvula is midline and mucous membranes are normal. Posterior oropharyngeal erythema present. No oropharyngeal exudate or posterior oropharyngeal edema.  Eyes: Pupils are equal, round, and reactive to light. Conjunctivae are normal. Right eye exhibits no discharge. Left eye exhibits no discharge. No scleral icterus.  Neck: Neck supple. No thyromegaly present.  Cardiovascular: Normal rate, regular rhythm, normal heart  sounds and intact distal pulses.  No murmur heard. Pulmonary/Chest: Effort normal. No respiratory distress. She has wheezes (end expiratory, diffuse, worse in b/l bases).  Intermittent deep cough  Musculoskeletal: She exhibits no edema.  Lymphadenopathy:    She has no cervical adenopathy.  Neurological: She is alert and oriented to person, place, and time.  Skin: Skin is warm and dry. Capillary refill takes less than 2 seconds. No rash noted.  Psychiatric: She has a normal mood and affect. Her behavior is normal.  Vitals reviewed.       Assessment & Plan:   1. Acute recurrent pansinusitis -Symptoms consistent with sinusitis -Given time course, likely to be bacterial in nature -Treat with Augmentin 7-day course - Also discussed symptomatic management -Continue treatment for allergic rhinitis -Discussed return precautions -Given prescription for Diflucan to use as needed if she develops a yeast infection after taking antibiotics  2. Severe persistent asthma with acute exacerbation - Patient with significant asthma and frequent exacerbations -She does have wheezing today, but continues to have good air movement -Will prescribe 12-day prednisone taper as this is worked well for the patient in the past -She knows to wait a few days and see if this improves after treating her sinusitis, but if it does start to worsen she can take the prednisone    Meds ordered this encounter  Medications  . amoxicillin-clavulanate (AUGMENTIN) 875-125 MG tablet    Sig: Take 1 tablet by mouth 2 (two) times daily for 7 days.    Dispense:  14 tablet    Refill:  0  . predniSONE (DELTASONE) 10 MG tablet    Sig: Take 60mg  PO daily x2 days, take 50mg  daily x2d, then 40mg  daily x2days, then 30mg  daily x2d, then 20mg  daily x2 days, then 10mg  daily x2d    Dispense:  42 tablet    Refill:  0  . fluconazole (DIFLUCAN) 150 MG tablet    Sig: Take 1 tablet (150 mg total) by mouth once for 1 dose.    Dispense:   1 tablet    Refill:  0     Return if symptoms worsen or fail to improve.   The entirety of the information documented in the History of Present Illness, Review of Systems and Physical Exam were personally obtained by me. Portions of this information were initially documented by Tiburcio Pea, CMA and reviewed by me for thoroughness and accuracy.    Virginia Crews, MD, MPH [facility] 06/25/2018 4:25 PM

## 2018-07-27 DIAGNOSIS — J309 Allergic rhinitis, unspecified: Secondary | ICD-10-CM | POA: Diagnosis not present

## 2018-07-27 DIAGNOSIS — J019 Acute sinusitis, unspecified: Secondary | ICD-10-CM | POA: Diagnosis not present

## 2018-07-27 DIAGNOSIS — J339 Nasal polyp, unspecified: Secondary | ICD-10-CM | POA: Diagnosis not present

## 2018-07-27 DIAGNOSIS — J4520 Mild intermittent asthma, uncomplicated: Secondary | ICD-10-CM | POA: Diagnosis not present

## 2018-07-27 DIAGNOSIS — R430 Anosmia: Secondary | ICD-10-CM | POA: Diagnosis not present

## 2018-08-03 DIAGNOSIS — D485 Neoplasm of uncertain behavior of skin: Secondary | ICD-10-CM | POA: Diagnosis not present

## 2018-08-03 DIAGNOSIS — J329 Chronic sinusitis, unspecified: Secondary | ICD-10-CM | POA: Diagnosis not present

## 2018-08-11 DIAGNOSIS — J301 Allergic rhinitis due to pollen: Secondary | ICD-10-CM | POA: Diagnosis not present

## 2018-08-18 DIAGNOSIS — J3489 Other specified disorders of nose and nasal sinuses: Secondary | ICD-10-CM | POA: Diagnosis not present

## 2018-08-18 DIAGNOSIS — J329 Chronic sinusitis, unspecified: Secondary | ICD-10-CM | POA: Diagnosis not present

## 2018-08-18 DIAGNOSIS — J301 Allergic rhinitis due to pollen: Secondary | ICD-10-CM | POA: Diagnosis not present

## 2018-08-18 DIAGNOSIS — J342 Deviated nasal septum: Secondary | ICD-10-CM | POA: Diagnosis not present

## 2018-09-29 ENCOUNTER — Encounter: Payer: Self-pay | Admitting: Physician Assistant

## 2018-09-29 ENCOUNTER — Ambulatory Visit: Payer: BLUE CROSS/BLUE SHIELD | Admitting: Physician Assistant

## 2018-09-29 VITALS — BP 141/85 | HR 87 | Temp 98.0°F | Resp 16 | Wt 188.0 lb

## 2018-09-29 DIAGNOSIS — J45901 Unspecified asthma with (acute) exacerbation: Secondary | ICD-10-CM | POA: Diagnosis not present

## 2018-09-29 MED ORDER — IPRATROPIUM-ALBUTEROL 0.5-2.5 (3) MG/3ML IN SOLN: 3.0000 mL | Freq: Once | RESPIRATORY_TRACT | Status: DC

## 2018-09-29 MED ORDER — PREDNISONE 10 MG PO TABS: ORAL_TABLET | ORAL | 0 refills | Status: DC

## 2018-09-29 NOTE — Patient Instructions (Signed)

## 2018-09-29 NOTE — Progress Notes (Signed)
Patient: Angela Reyes Female    DOB: [birth year]   57 y.o.   MRN: [id number] Visit Date: 09/29/2018  Today's Provider: Trinna Post, PA-C   Chief Complaint  Patient presents with  . Cough   Subjective:    Cough  This is a chronic problem. The current episode started 1 to 4 weeks ago. The problem has been gradually worsening. The cough is productive of sputum. Associated symptoms include nasal congestion, postnasal drip, shortness of breath and wheezing. Pertinent negatives include no ear congestion or ear pain. Treatments tried: Mucinex-D and allergy medicines, inhalers and nasal spray. The treatment provided mild relief.    Patient has a history of asthma currently on breo, singulair and albuterol as well as chronic sinusitis with nasal polyps. Patient is going to have sinus surgery with Dr. Selena Batten in January 2020. Patient also reports that she was tested for allergy and she was given the sublingual drops. She still waiting on her epipen.    No Known Allergies   Current Outpatient Medications:  .  albuterol (PROAIR HFA) 108 (90 Base) MCG/ACT inhaler, Inhale 2 puffs into the lungs every 6 (six) hours as needed for wheezing or shortness of breath., Disp: 1 Inhaler, Rfl: 5 .  albuterol (PROVENTIL) (2.5 MG/3ML) 0.083% nebulizer solution, Take 3 mLs (2.5 mg total) by nebulization every 6 (six) hours as needed for wheezing or shortness of breath., Disp: 75 mL, Rfl: 1 .  cetirizine (ZYRTEC) 10 MG tablet, Take 10 mg by mouth at bedtime. Reported on 05/01/2016, Disp: , Rfl:  .  esomeprazole (NEXIUM) 40 MG capsule, TAKE 1 CAPSULE (40 MG TOTAL) BY MOUTH DAILY AT 12 NOON., Disp: 90 capsule, Rfl: 3 .  fluticasone furoate-vilanterol (BREO ELLIPTA) 200-25 MCG/INH AEPB, Inhale 1 puff into the lungs every evening., Disp: 84 each, Rfl: 3 .  Fluticasone Propionate (XHANCE) 43 MCG/ACT EXHU, Place into the nose., Disp: , Rfl:  .  montelukast (SINGULAIR) 10 MG tablet, TAKE 1 TABLET (10 MG  TOTAL) BY MOUTH AT BEDTIME., Disp: 90 tablet, Rfl: 2 .  predniSONE (DELTASONE) 10 MG tablet, Take 60mg  PO daily x2 days, take 50mg  daily x2d, then 40mg  daily x2days, then 30mg  daily x2d, then 20mg  daily x2 days, then 10mg  daily x2d (Patient not taking: Reported on 09/29/2018), Disp: 42 tablet, Rfl: 0  Review of Systems  HENT: Positive for congestion, postnasal drip, sinus pressure and sinus pain. Negative for ear pain.   Respiratory: Positive for cough, chest tightness, shortness of breath and wheezing.     Social History   Tobacco Use  . Smoking status: Never Smoker  . Smokeless tobacco: Never Used  Substance Use Topics  . Alcohol use: No   Objective:   BP (!) 141/85 (BP Location: Left Arm, Patient Position: Sitting, Cuff Size: Large)   Pulse 87   Temp 98 F (36.7 C) (Oral)   Resp 16   Wt 188 lb (85.3 kg)   LMP 02/10/2015 Comment: 1st one since dec 2014  SpO2 93%   BMI 27.36 kg/m  Vitals:   09/29/18 0814  BP: (!) 141/85  Pulse: 87  Resp: 16  Temp: 98 F (36.7 C)  TempSrc: Oral  SpO2: 93%  Weight: 188 lb (85.3 kg)     Physical Exam  Constitutional: She appears well-developed and well-nourished.  Cardiovascular: Normal rate and regular rhythm.  Pulmonary/Chest: Effort normal. No respiratory distress. She has wheezes.  Poor air entry bilaterally with diffuse wheezing and rhonchi. Oxygenation  improves from 93% to 98% after duoneb administration. Lung sounds improve moderately as well with increased air entry and less wheezing.   Skin: Skin is warm and dry.  Psychiatric: She has a normal mood and affect. Her behavior is normal.        Assessment & Plan:    1. Exacerbation of asthma, unspecified asthma severity, unspecified whether persistent  Duoneb in office improved symptoms. Will give prednisone taper for her, she reports 12 day taper is what she usually has to take. Will hold off on antibiotics for now unless she experiences worsening of sinus symptoms. Per EMR,  she appears to have asthma exacerbation every couple of months and this likely indicates her asthma is not well controlled. Encouraged her to follow up with pulmonology to address this.  - ipratropium-albuterol (DUONEB) 0.5-2.5 (3) MG/3ML nebulizer solution 3 mL - predniSONE (DELTASONE) 10 MG tablet; Take 6 pills on day 1&2, 5 pills on day 3&4, 4 pills on day 5&6 and so on.  Dispense: 42 tablet; Refill: 0  Return if symptoms worsen or fail to improve.  The entirety of the information documented in the History of Present Illness, Review of Systems and Physical Exam were personally obtained by me. Portions of this information were initially documented by Lyndel Pleasure, CMA and reviewed by me for thoroughness and accuracy.            Trinna Post, PA-C  [facility]

## 2018-10-12 ENCOUNTER — Other Ambulatory Visit: Payer: Self-pay | Admitting: Physician Assistant

## 2018-10-12 DIAGNOSIS — J309 Allergic rhinitis, unspecified: Secondary | ICD-10-CM

## 2018-11-10 ENCOUNTER — Telehealth: Payer: Self-pay

## 2018-11-10 NOTE — Telephone Encounter (Signed)
Patient contacted office earlier requesting appt to see Tawanna Sat, patient complained of cough and shortness of breath. I triaged patient on the phone who states that she has had cough and difficulty breathing over the past 4-5 days, patient reports that she has a history of asthma and has been wheezing. Patient reports that she has been using her inhaler for relief of shortness of breath, she states that she was diagnosed and treated for Flu a week ago. Patient denies symptoms of chest pain, back pain, fever, muscle/joint pain, weakness, sore throat, nausea or vomiting. Patient declined appt today to see another P.A in office and requested to only see Jenni. I have arranged an appt for patient to be see tomorrow at 7:40AM with jenni. KW

## 2018-11-10 NOTE — Progress Notes (Signed)
Patient: Angela Reyes Female    DOB: 02/07/1961   58 y.o.   MRN: [id number] Visit Date: 11/11/2018  Today's Provider: Mar Daring, PA-C   Chief Complaint  Patient presents with  . Cough   Subjective:     Cough  This is a new problem. The current episode started in the past 7 days (5 days). The cough is productive of sputum. Associated symptoms include ear congestion, headaches, nasal congestion and wheezing. Pertinent negatives include no chest pain, chills, ear pain, fever, heartburn, hemoptysis, myalgias, postnasal drip, rash, rhinorrhea, sore throat, sweats or weight loss. The symptoms are aggravated by lying down and exercise. She has tried prescription cough suppressant and steroid inhaler for the symptoms. The treatment provided mild relief.   Patient has been having cough and shortness of breath for 5 days. Cough is productive. Patient also has symptoms of itchy ears, headaches, nasal congestion, wheezing, sinus pressure and pain. Patient had a viral gastroenteritis over 1 week ago, but cough has lingered. Patient has been using her prescribed inhalers and neb with mild relief.  No Known Allergies   Current Outpatient Medications:  .  albuterol (PROAIR HFA) 108 (90 Base) MCG/ACT inhaler, Inhale 2 puffs into the lungs every 6 (six) hours as needed for wheezing or shortness of breath., Disp: 1 Inhaler, Rfl: 5 .  albuterol (PROVENTIL) (2.5 MG/3ML) 0.083% nebulizer solution, Take 3 mLs (2.5 mg total) by nebulization every 6 (six) hours as needed for wheezing or shortness of breath., Disp: 75 mL, Rfl: 1 .  cetirizine (ZYRTEC) 10 MG tablet, Take 10 mg by mouth at bedtime. Reported on 05/01/2016, Disp: , Rfl:  .  Chlorpheniramine Maleate (ALLERGY PO), Take by mouth daily. Drops, Disp: , Rfl:  .  esomeprazole (NEXIUM) 40 MG capsule, TAKE 1 CAPSULE (40 MG TOTAL) BY MOUTH DAILY AT 12 NOON., Disp: 90 capsule, Rfl: 3 .  fluticasone furoate-vilanterol (BREO ELLIPTA) 200-25  MCG/INH AEPB, Inhale 1 puff into the lungs every evening., Disp: 84 each, Rfl: 3 .  Fluticasone Propionate (XHANCE) 93 MCG/ACT EXHU, Place into the nose., Disp: , Rfl:  .  montelukast (SINGULAIR) 10 MG tablet, TAKE 1 TABLET (10 MG TOTAL) BY MOUTH AT BEDTIME., Disp: 90 tablet, Rfl: 2 .  predniSONE (DELTASONE) 10 MG tablet, Take 6 pills on day 1&2, 5 pills on day 3&4, 4 pills on day 5&6 and so on. (Patient not taking: Reported on 11/11/2018), Disp: 42 tablet, Rfl: 0  Current Facility-Administered Medications:  .  ipratropium-albuterol (DUONEB) 0.5-2.5 (3) MG/3ML nebulizer solution 3 mL, 3 mL, Nebulization, Once, [place], Adriana M, PA-C  Review of Systems  Constitutional: Negative for appetite change, chills, fatigue, fever and weight loss.  HENT: Positive for congestion, sinus pressure and sinus pain. Negative for ear pain, postnasal drip, rhinorrhea and sore throat.   Respiratory: Positive for cough and wheezing. Negative for hemoptysis and chest tightness.   Cardiovascular: Negative for chest pain and palpitations.  Gastrointestinal: Negative for abdominal pain, heartburn, nausea and vomiting.  Musculoskeletal: Negative for myalgias.  Skin: Negative for rash.  Neurological: Positive for headaches. Negative for dizziness and weakness.    Social History   Tobacco Use  . Smoking status: Never Smoker  . Smokeless tobacco: Never Used  Substance Use Topics  . Alcohol use: No      Objective:   BP (!) 141/92 (BP Location: Left Arm, Patient Position: Sitting, Cuff Size: Large)   Pulse 89   Temp 97.9 F (36.6  C) (Oral)   Resp 16   Wt 193 lb (87.5 kg)   LMP 02/10/2015 Comment: 1st one since dec 2014  SpO2 93%   BMI 28.09 kg/m  Vitals:   11/11/18 0745  BP: (!) 141/92  Pulse: 89  Resp: 16  Temp: 97.9 F (36.6 C)  TempSrc: Oral  SpO2: 93%  Weight: 193 lb (87.5 kg)     Physical Exam Vitals signs reviewed.  Constitutional:      General: She is not in acute distress.     Appearance: She is well-developed. She is not diaphoretic.  HENT:     Head: Normocephalic and atraumatic.     Right Ear: Hearing, tympanic membrane, ear canal and external ear normal.     Left Ear: Hearing, tympanic membrane, ear canal and external ear normal.     Nose: Mucosal edema and rhinorrhea present. Rhinorrhea is purulent.     Right Sinus: Maxillary sinus tenderness and frontal sinus tenderness present.     Left Sinus: Maxillary sinus tenderness and frontal sinus tenderness present.     Mouth/Throat:     Lips: Pink.     Mouth: Mucous membranes are moist.     Pharynx: Uvula midline. Posterior oropharyngeal erythema (thick purulent mucous drainage also noted) present. No oropharyngeal exudate.  Eyes:     General: No scleral icterus.       Right eye: No discharge.        Left eye: No discharge.     Conjunctiva/sclera: Conjunctivae normal.     Pupils: Pupils are equal, round, and reactive to light.  Neck:     Musculoskeletal: Normal range of motion and neck supple.     Thyroid: No thyromegaly.     Trachea: No tracheal deviation.  Cardiovascular:     Rate and Rhythm: Normal rate and regular rhythm.     Heart sounds: Normal heart sounds. No murmur. No friction rub. No gallop.   Pulmonary:     Effort: Pulmonary effort is normal. No respiratory distress.     Breath sounds: No stridor. Decreased breath sounds (throughout) and wheezing (throughout) present. No rales.  Lymphadenopathy:     Cervical: No cervical adenopathy.  Skin:    General: Skin is warm and dry.         Assessment & Plan    1. Moderate persistent asthma with acute exacerbation Duoneb treatment given today in the office with mild improvements. Will also prescribe doxycycline for sinusitis and prednisone for inflammation. Hycodan cough syrup given as below for cough. Advised of continuing Mucinex for the congestion. Call if symptoms worsen or fail to improve.  - ipratropium-albuterol (DUONEB) 0.5-2.5 (3) MG/3ML  nebulizer solution 3 mL - doxycycline (VIBRA-TABS) 100 MG tablet; Take 1 tablet (100 mg total) by mouth 2 (two) times daily.  Dispense: 20 tablet; Refill: 0 - predniSONE (DELTASONE) 10 MG tablet; Take 6 tabs PO on day 1&2, 5 tabs PO on day 3&4, 4 tabs PO on day 5&6, 3 tabs PO on day 7&8, 2 tabs PO on day 9&10, 1 tab PO on day 11&12.  Dispense: 42 tablet; Refill: 0 - HYDROcodone-homatropine (HYCODAN) 5-1.5 MG/5ML syrup; Take 5 mLs by mouth every 8 (eight) hours as needed for cough.  Dispense: 240 mL; Refill: 0  2. Acute non-recurrent pansinusitis See above medical treatment plan. - doxycycline (VIBRA-TABS) 100 MG tablet; Take 1 tablet (100 mg total) by mouth 2 (two) times daily.  Dispense: 20 tablet; Refill: 0 - predniSONE (DELTASONE) 10 MG tablet; Take  6 tabs PO on day 1&2, 5 tabs PO on day 3&4, 4 tabs PO on day 5&6, 3 tabs PO on day 7&8, 2 tabs PO on day 9&10, 1 tab PO on day 11&12.  Dispense: 42 tablet; Refill: 0     Mar Daring, PA-C  [facility]

## 2018-11-11 ENCOUNTER — Ambulatory Visit: Payer: BLUE CROSS/BLUE SHIELD | Admitting: Physician Assistant

## 2018-11-11 ENCOUNTER — Encounter: Payer: Self-pay | Admitting: Physician Assistant

## 2018-11-11 VITALS — BP 141/92 | HR 89 | Temp 97.9°F | Resp 16 | Wt 193.0 lb

## 2018-11-11 DIAGNOSIS — J4541 Moderate persistent asthma with (acute) exacerbation: Secondary | ICD-10-CM

## 2018-11-11 DIAGNOSIS — J0140 Acute pansinusitis, unspecified: Secondary | ICD-10-CM

## 2018-11-11 MED ORDER — DOXYCYCLINE HYCLATE 100 MG PO TABS: 100.0000 mg | ORAL_TABLET | Freq: Two times a day (BID) | ORAL | 0 refills | Status: DC

## 2018-11-11 MED ORDER — HYDROCODONE-HOMATROPINE 5-1.5 MG/5ML PO SYRP: 5.0000 mL | ORAL_SOLUTION | Freq: Three times a day (TID) | ORAL | 0 refills | Status: DC | PRN

## 2018-11-11 MED ORDER — IPRATROPIUM-ALBUTEROL 0.5-2.5 (3) MG/3ML IN SOLN
3.0000 mL | Freq: Once | RESPIRATORY_TRACT | Status: AC
  Administered 2018-11-11: 3 mL via RESPIRATORY_TRACT

## 2018-11-11 MED ORDER — PREDNISONE 10 MG PO TABS: ORAL_TABLET | ORAL | 0 refills | Status: DC

## 2018-11-11 NOTE — Patient Instructions (Signed)

## 2018-12-08 ENCOUNTER — Ambulatory Visit: Payer: BLUE CROSS/BLUE SHIELD | Admitting: Family Medicine

## 2018-12-08 ENCOUNTER — Encounter: Payer: Self-pay | Admitting: Family Medicine

## 2018-12-08 ENCOUNTER — Other Ambulatory Visit: Payer: Self-pay

## 2018-12-08 VITALS — BP 122/78 | HR 81 | Temp 98.0°F | Wt 194.6 lb

## 2018-12-08 DIAGNOSIS — S86812A Strain of other muscle(s) and tendon(s) at lower leg level, left leg, initial encounter: Secondary | ICD-10-CM | POA: Diagnosis not present

## 2018-12-08 DIAGNOSIS — W19XXXA Unspecified fall, initial encounter: Secondary | ICD-10-CM

## 2018-12-08 DIAGNOSIS — S3992XA Unspecified injury of lower back, initial encounter: Secondary | ICD-10-CM

## 2018-12-08 MED ORDER — MELOXICAM 15 MG PO TABS: 15.0000 mg | ORAL_TABLET | Freq: Every day | ORAL | 0 refills | Status: DC

## 2018-12-08 NOTE — Progress Notes (Signed)
Patient: Angela Reyes Female    DOB: [birth year]   58 y.o.   MRN: [id number] Visit Date: 12/08/2018  Today's Provider: Lavon Paganini, MD   Chief Complaint  Patient presents with  . Fall    11/27/2018   Subjective:     Fall  The accident occurred more than 1 week ago (fall on 11/27/2018). The fall occurred while walking (while bowling tripped walking backwards). She fell from a height of 1 to 2 ft. She landed on carpet. There was no blood loss. The point of impact was the buttocks, left knee and left foot. The pain is present in the buttocks, left lower leg and left foot. The pain is at a severity of 6/10. The symptoms are aggravated by standing and movement. She has tried ice, heat and acetaminophen for the symptoms. The treatment provided mild relief.   Tylenol takes the edge off Worse at the end of the day Throbbing in calf Hard to get in a comfortable position Doesn't seem to be getting better Walking with a limb   No Known Allergies   Current Outpatient Medications:  .  albuterol (PROAIR HFA) 108 (90 Base) MCG/ACT inhaler, Inhale 2 puffs into the lungs every 6 (six) hours as needed for wheezing or shortness of breath., Disp: 1 Inhaler, Rfl: 5 .  albuterol (PROVENTIL) (2.5 MG/3ML) 0.083% nebulizer solution, Take 3 mLs (2.5 mg total) by nebulization every 6 (six) hours as needed for wheezing or shortness of breath., Disp: 75 mL, Rfl: 1 .  cetirizine (ZYRTEC) 10 MG tablet, Take 10 mg by mouth at bedtime. Reported on 05/01/2016, Disp: , Rfl:  .  Chlorpheniramine Maleate (ALLERGY PO), Take by mouth daily. Drops, Disp: , Rfl:  .  esomeprazole (NEXIUM) 40 MG capsule, TAKE 1 CAPSULE (40 MG TOTAL) BY MOUTH DAILY AT 12 NOON., Disp: 90 capsule, Rfl: 3 .  fluticasone furoate-vilanterol (BREO ELLIPTA) 200-25 MCG/INH AEPB, Inhale 1 puff into the lungs every evening., Disp: 84 each, Rfl: 3 .  Fluticasone Propionate (XHANCE) 93 MCG/ACT EXHU, Place into the nose., Disp: , Rfl:  .   montelukast (SINGULAIR) 10 MG tablet, TAKE 1 TABLET (10 MG TOTAL) BY MOUTH AT BEDTIME., Disp: 90 tablet, Rfl: 2  Review of Systems  Constitutional: Negative.   HENT: Negative.   Eyes: Positive for visual disturbance (after fall not now). Negative for photophobia, pain, discharge, redness and itching.  Respiratory: Negative.   Cardiovascular: Negative.   Genitourinary: Negative.   Musculoskeletal: Positive for arthralgias, gait problem and myalgias.  Skin: Negative.     Social History   Tobacco Use  . Smoking status: Never Smoker  . Smokeless tobacco: Never Used  Substance Use Topics  . Alcohol use: No      Objective:   LMP 02/10/2015 Comment: 1st one since dec 2014 There were no vitals filed for this visit.   Physical Exam Vitals signs reviewed.  Constitutional:      General: She is not in acute distress.    Appearance: Normal appearance. She is not diaphoretic.  HENT:     Head: Normocephalic and atraumatic.  Eyes:     General: No scleral icterus.    Conjunctiva/sclera: Conjunctivae normal.  Cardiovascular:     Rate and Rhythm: Normal rate and regular rhythm.  Pulmonary:     Effort: Pulmonary effort is normal. No respiratory distress.  Abdominal:     General: There is no distension.     Palpations: Abdomen is soft.  Tenderness: There is no abdominal tenderness.  Musculoskeletal:        General: Tenderness (TTP over L lateral posterior calf) present. No deformity.     Left knee: She exhibits normal range of motion, no swelling, no effusion, no deformity, no LCL laxity, no bony tenderness, normal meniscus and no MCL laxity. No tenderness found.     Left ankle: Normal.     Lumbar back: She exhibits tenderness (across upper buttocks).     Right lower leg: No edema.     Left lower leg: Edema (trace, non-pitting) present.  Skin:    General: Skin is warm and dry.     Capillary Refill: Capillary refill takes less than 2 seconds.     Findings: Bruising (on lateral L  ankle) present. No rash.  Neurological:     Mental Status: She is alert and oriented to person, place, and time. Mental status is at baseline.     Sensory: No sensory deficit.     Motor: No weakness.     Coordination: Coordination normal.     Gait: Gait abnormal (antalgic gait).  Psychiatric:        Mood and Affect: Mood normal.        Behavior: Behavior normal.         Assessment & Plan   1. Injury of coccyx, initial encounter - new problem - 2/2 fall - no crepitus or concerning features - discussed symptomatic management of bruised coccyx - advised offloading weight, avoiding constipation, NSAIDs, icing - discussed return precautions  2. Fall, initial encounter - mechanical fall - no LOC - discussed precautios  3. Strain of calf muscle, left, initial encounter - new problem - related to fall - likely cause of calf pain and swelling - no signs of DVT - discussed compression, elevation, rest, ice - will treat with Mobic - ok to use Tylenol prn - discussed rehab after improves - discussed return precautions    Meds ordered this encounter  Medications  . meloxicam (MOBIC) 15 MG tablet    Sig: Take 1 tablet (15 mg total) by mouth daily.    Dispense:  30 tablet    Refill:  0     Return if symptoms worsen or fail to improve.   The entirety of the information documented in the History of Present Illness, Review of Systems and Physical Exam were personally obtained by me. Portions of this information were initially documented by Kellie Shropshire, CMA and reviewed by me for thoroughness and accuracy.    Virginia Crews, MD, MPH [facility] 12/08/2018 11:11 AM

## 2018-12-30 ENCOUNTER — Telehealth: Payer: Self-pay | Admitting: *Deleted

## 2018-12-30 DIAGNOSIS — J4541 Moderate persistent asthma with (acute) exacerbation: Secondary | ICD-10-CM

## 2018-12-30 MED ORDER — DOXYCYCLINE HYCLATE 100 MG PO TABS: 100.0000 mg | ORAL_TABLET | Freq: Two times a day (BID) | ORAL | 0 refills | Status: DC

## 2018-12-30 MED ORDER — PREDNISONE 10 MG PO TABS: ORAL_TABLET | ORAL | 0 refills | Status: DC

## 2018-12-30 NOTE — Telephone Encounter (Signed)
Patient states she has cough, congestion, and chest tightness again. Patient wanted to know if she needs to come in for an appt? Patient would like to speak to [place] or one of her nurses. Please advise?

## 2018-12-30 NOTE — Telephone Encounter (Signed)
Sent in doxycycline and prednisone for her. Call if worsening.

## 2018-12-30 NOTE — Telephone Encounter (Signed)
Patient advised as below.  

## 2019-01-01 ENCOUNTER — Other Ambulatory Visit: Payer: Self-pay | Admitting: Family Medicine

## 2019-01-01 DIAGNOSIS — M2550 Pain in unspecified joint: Secondary | ICD-10-CM

## 2019-01-01 NOTE — Telephone Encounter (Signed)
See Rx request ° °

## 2019-01-04 ENCOUNTER — Other Ambulatory Visit: Payer: Self-pay | Admitting: Physician Assistant

## 2019-01-04 DIAGNOSIS — J4540 Moderate persistent asthma, uncomplicated: Secondary | ICD-10-CM

## 2019-02-15 ENCOUNTER — Other Ambulatory Visit: Payer: Self-pay | Admitting: Physician Assistant

## 2019-02-15 ENCOUNTER — Ambulatory Visit (INDEPENDENT_AMBULATORY_CARE_PROVIDER_SITE_OTHER): Payer: BLUE CROSS/BLUE SHIELD | Admitting: Physician Assistant

## 2019-02-15 ENCOUNTER — Encounter: Payer: Self-pay | Admitting: Physician Assistant

## 2019-02-15 DIAGNOSIS — J4541 Moderate persistent asthma with (acute) exacerbation: Secondary | ICD-10-CM

## 2019-02-15 DIAGNOSIS — J4540 Moderate persistent asthma, uncomplicated: Secondary | ICD-10-CM

## 2019-02-15 MED ORDER — DOXYCYCLINE HYCLATE 100 MG PO TABS: 100.0000 mg | ORAL_TABLET | Freq: Two times a day (BID) | ORAL | 0 refills | Status: DC

## 2019-02-15 MED ORDER — HYDROCODONE-HOMATROPINE 5-1.5 MG/5ML PO SYRP: 5.0000 mL | ORAL_SOLUTION | Freq: Three times a day (TID) | ORAL | 0 refills | Status: DC | PRN

## 2019-02-15 MED ORDER — PREDNISONE 10 MG PO TABS: ORAL_TABLET | ORAL | 0 refills | Status: DC

## 2019-02-15 NOTE — Progress Notes (Signed)
Virtual Visit via Video Note  I connected with [name] on 02/15/19 at 10:40 AM EDT by a video enabled telemedicine application and verified that I am speaking with the correct person using two identifiers.   I discussed the limitations of evaluation and management by telemedicine and the availability of in person appointments. The patient expressed understanding and agreed to proceed.  Patient location: work Secondary school teacher location: [place] involved in the visit: patient, provider  Interactive audio and video communications were attempted, although failed due to patient's inability to connect to video. Continued visit with audio only interaction with patient agreement.   Mar Daring, PA-C   Patient: Angela Reyes Female    DOB: Feb 27, 1961   58 y.o.   MRN: [id number] Visit Date: 02/15/2019  Today's Provider: Mar Daring, PA-C   Chief Complaint  Patient presents with  . Asthma   Subjective:    I, Angela Reyes CMA, am acting as a Education administrator for [employer].   No vital was obtained during this visit.  Asthma  She complains of chest tightness, cough, shortness of breath and wheezing. This is a recurrent problem. The current episode started in the past 7 days. The problem has been unchanged. Associated symptoms include nasal congestion, postnasal drip and sneezing. Her symptoms are aggravated by exercise and any activity. Her past medical history is significant for asthma.    No Known Allergies   Current Outpatient Medications:  .  albuterol (PROAIR HFA) 108 (90 Base) MCG/ACT inhaler, Inhale 2 puffs into the lungs every 6 (six) hours as needed for wheezing or shortness of breath., Disp: 1 Inhaler, Rfl: 5 .  albuterol (PROVENTIL) (2.5 MG/3ML) 0.083% nebulizer solution, Take 3 mLs (2.5 mg total) by nebulization every 6 (six) hours as needed for wheezing or shortness of breath., Disp: 75 mL, Rfl: 1 .  BREO ELLIPTA 200-25  MCG/INH AEPB, INHALE 1 PUFF INTO THE LUNGS EVERY EVENING., Disp: 60 each, Rfl: 3 .  cetirizine (ZYRTEC) 10 MG tablet, Take 10 mg by mouth at bedtime. Reported on 05/01/2016, Disp: , Rfl:  .  Chlorpheniramine Maleate (ALLERGY PO), Take by mouth daily. Drops, Disp: , Rfl:  .  esomeprazole (NEXIUM) 40 MG capsule, TAKE 1 CAPSULE (40 MG TOTAL) BY MOUTH DAILY AT 12 NOON., Disp: 90 capsule, Rfl: 3 .  Fluticasone Propionate (XHANCE) 93 MCG/ACT EXHU, Place into the nose., Disp: , Rfl:  .  meloxicam (MOBIC) 15 MG tablet, TAKE 1 TABLET BY MOUTH EVERY DAY, Disp: 90 tablet, Rfl: 1 .  montelukast (SINGULAIR) 10 MG tablet, TAKE 1 TABLET (10 MG TOTAL) BY MOUTH AT BEDTIME., Disp: 90 tablet, Rfl: 2 .  doxycycline (VIBRA-TABS) 100 MG tablet, Take 1 tablet (100 mg total) by mouth 2 (two) times daily. (Patient not taking: Reported on 02/15/2019), Disp: 20 tablet, Rfl: 0 .  predniSONE (DELTASONE) 10 MG tablet, Take 6 tabs PO on day 1&2, 5 tabs PO on day 3&4, 4 tabs PO on day 5&6, 3 tabs PO on day 7&8, 2 tabs PO on day 9&10, 1 tab PO on day 11&12. (Patient not taking: Reported on 02/15/2019), Disp: 42 tablet, Rfl: 0  Review of Systems  Constitutional: Negative.   HENT: Positive for congestion, postnasal drip and sneezing.   Respiratory: Positive for cough, shortness of breath and wheezing.   Cardiovascular: Negative.   Gastrointestinal: Negative.   Neurological: Negative.     Social History   Tobacco Use  . Smoking status: Never  Smoker  . Smokeless tobacco: Never Used  Substance Use Topics  . Alcohol use: No      Objective:   LMP 02/10/2015 Comment: 1st one since dec 2014 There were no vitals filed for this visit.   Physical Exam Constitutional:      General: She is not in acute distress. HENT:     Head:     Comments: Hoarse, scratchy voice Cardiovascular:     Comments: Dry, barky cough present Pulmonary:     Effort: No respiratory distress.  Neurological:     Mental Status: She is alert.          Assessment & Plan    1. Moderate persistent asthmatic bronchitis with acute exacerbation Worsening. Will treat with Doxycycline, prednisone taper and Hycodan cough syrup as below. Continue inhalers as prescribed. Continue nebulizer prn. Call if worsening. - predniSONE (DELTASONE) 10 MG tablet; Take 6 tabs PO on day 1&2, 5 tabs PO on day 3&4, 4 tabs PO on day 5&6, 3 tabs PO on day 7&8, 2 tabs PO on day 9&10, 1 tab PO on day 11&12.  Dispense: 42 tablet; Refill: 0 - doxycycline (VIBRA-TABS) 100 MG tablet; Take 1 tablet (100 mg total) by mouth 2 (two) times daily.  Dispense: 20 tablet; Refill: 0 - HYDROcodone-homatropine (HYCODAN) 5-1.5 MG/5ML syrup; Take 5 mLs by mouth every 8 (eight) hours as needed for cough.  Dispense: 120 mL; Refill: 0    I discussed the assessment and treatment plan with the patient. The patient was provided an opportunity to ask questions and all were answered. The patient agreed with the plan and demonstrated an understanding of the instructions.   The patient was advised to call back or seek an in-person evaluation if the symptoms worsen or if the condition fails to improve as anticipated.  I provided 13 minutes of non-face-to-face time during this encounter.  Mar Daring, PA-C  [facility]

## 2019-03-25 ENCOUNTER — Ambulatory Visit (INDEPENDENT_AMBULATORY_CARE_PROVIDER_SITE_OTHER): Payer: BC Managed Care – PPO | Admitting: Physician Assistant

## 2019-03-25 DIAGNOSIS — J4541 Moderate persistent asthma with (acute) exacerbation: Secondary | ICD-10-CM | POA: Diagnosis not present

## 2019-03-25 MED ORDER — PREDNISONE 10 MG PO TABS: ORAL_TABLET | ORAL | 0 refills | Status: DC

## 2019-03-25 MED ORDER — HYDROCODONE-HOMATROPINE 5-1.5 MG/5ML PO SYRP: 5.0000 mL | ORAL_SOLUTION | Freq: Three times a day (TID) | ORAL | 0 refills | Status: DC | PRN

## 2019-03-25 MED ORDER — SULFAMETHOXAZOLE-TRIMETHOPRIM 800-160 MG PO TABS: 1.0000 | ORAL_TABLET | Freq: Two times a day (BID) | ORAL | 0 refills | Status: DC

## 2019-04-01 DIAGNOSIS — D485 Neoplasm of uncertain behavior of skin: Secondary | ICD-10-CM | POA: Diagnosis not present

## 2019-04-01 DIAGNOSIS — Z1283 Encounter for screening for malignant neoplasm of skin: Secondary | ICD-10-CM | POA: Diagnosis not present

## 2019-04-01 DIAGNOSIS — D225 Melanocytic nevi of trunk: Secondary | ICD-10-CM | POA: Diagnosis not present

## 2019-04-01 DIAGNOSIS — L578 Other skin changes due to chronic exposure to nonionizing radiation: Secondary | ICD-10-CM | POA: Diagnosis not present

## 2019-04-05 ENCOUNTER — Encounter: Payer: Self-pay | Admitting: Physician Assistant

## 2019-04-05 NOTE — Progress Notes (Signed)
Virtual Visit via Video Note  I connected with Angela Reyes on 04/05/19 at  3:20 PM EDT by a video enabled telemedicine application and verified that I am speaking with the correct person using two identifiers.  Location: Patient: Home Provider: [facility]   I discussed the limitations of evaluation and management by telemedicine and the availability of in person appointments. The patient expressed understanding and agreed to proceed.  Mar Daring, PA-C  Acute Office Visit  Subjective:    Patient ID: Angela Reyes, female    DOB: 03-05-1961, 58 y.o.   MRN: [id number]  No chief complaint on file.   HPI Patient is in today for asthmatic exacerbation. She reports that over the last 7 days she has had to increase her rescue inhaler use, has had more productive cough, wheezing and SOB. She denies any fevers, chills, nausea, vomiting, or sinus symptoms.   Past Medical History:  Diagnosis Date  . Asthma   . Chronic rhinosinusitis   . GERD (gastroesophageal reflux disease)   . History of gestational diabetes   . History of hiatal hernia   . OSA (obstructive sleep apnea)    non-compliant cpap  . Pulmonary nodule, right    middle lobe-- stable low risk--  pulmologist,  dr Chase Caller  . Right ureteral stone     Past Surgical History:  Procedure Laterality Date  . CYSTOSCOPY W/ URETERAL STENT PLACEMENT Right 06/07/2015   Procedure: CYSTOSCOPY WITH RETROGRADE PYELOGRAM/ STONE EXTRACTION AND URETERAL STENT PLACEMENT;  Surgeon: Cleon Gustin, MD;  Location: [facility];  Service: Urology;  Laterality: Right;  . EXTRACORPOREAL SHOCK WAVE LITHOTRIPSY Right 05-15-2015  . HOLMIUM LASER APPLICATION Right 06/03/4817   Procedure: HOLMIUM LASER APPLICATION;  Surgeon: Cleon Gustin, MD;  Location: [facility];  Service: Urology;  Laterality: Right;  . TONSILLECTOMY  age 67  . WISDOM TOOTH EXTRACTION  age 68    Family History  Problem Relation Age  of Onset  . Hypertension Mother   . Hypertension Father   . Kidney disease Brother   . Hypertension Maternal Grandmother   . Cancer Paternal Grandfather   . Allergies Other        siblings    Social History   Socioeconomic History  . Marital status: Divorced    Spouse name: Not on file  . Number of children: Not on file  . Years of education: Not on file  . Highest education level: Not on file  Occupational History  . Not on file  Social Needs  . Financial resource strain: Not on file  . Food insecurity    Worry: Not on file    Inability: Not on file  . Transportation needs    Medical: Not on file    Non-medical: Not on file  Tobacco Use  . Smoking status: Never Smoker  . Smokeless tobacco: Never Used  Substance and Sexual Activity  . Alcohol use: No  . Drug use: No  . Sexual activity: Not on file    Comment: friend has vasctomy  Lifestyle  . Physical activity    Days per week: Not on file    Minutes per session: Not on file  . Stress: Not on file  Relationships  . Social Herbalist on phone: Not on file    Gets together: Not on file    Attends religious service: Not on file    Active member of club or organization: Not on  file    Attends meetings of clubs or organizations: Not on file    Relationship status: Not on file  . Intimate partner violence    Fear of current or ex partner: Not on file    Emotionally abused: Not on file    Physically abused: Not on file    Forced sexual activity: Not on file  Other Topics Concern  . Not on file  Social History Narrative  . Not on file    Outpatient Medications Prior to Visit  Medication Sig Dispense Refill  . albuterol (PROAIR HFA) 108 (90 Base) MCG/ACT inhaler Inhale 2 puffs into the lungs every 6 (six) hours as needed for wheezing or shortness of breath. 1 Inhaler 5  . albuterol (PROVENTIL) (2.5 MG/3ML) 0.083% nebulizer solution Take 3 mLs (2.5 mg total) by nebulization every 6 (six) hours as needed  for wheezing or shortness of breath. 75 mL 1  . BREO ELLIPTA 200-25 MCG/INH AEPB INHALE 1 PUFF INTO THE LUNGS EVERY EVENING. 60 each 3  . cetirizine (ZYRTEC) 10 MG tablet Take 10 mg by mouth at bedtime. Reported on 05/01/2016    . Chlorpheniramine Maleate (ALLERGY PO) Take by mouth daily. Drops    . esomeprazole (NEXIUM) 40 MG capsule TAKE 1 CAPSULE (40 MG TOTAL) BY MOUTH DAILY AT 12 NOON. 90 capsule 3  . Fluticasone Propionate (XHANCE) 93 MCG/ACT EXHU Place into the nose.    . meloxicam (MOBIC) 15 MG tablet TAKE 1 TABLET BY MOUTH EVERY DAY 90 tablet 1  . montelukast (SINGULAIR) 10 MG tablet TAKE 1 TABLET (10 MG TOTAL) BY MOUTH AT BEDTIME. 90 tablet 2  . doxycycline (VIBRA-TABS) 100 MG tablet Take 1 tablet (100 mg total) by mouth 2 (two) times daily. 20 tablet 0  . HYDROcodone-homatropine (HYCODAN) 5-1.5 MG/5ML syrup Take 5 mLs by mouth every 8 (eight) hours as needed for cough. 120 mL 0  . predniSONE (DELTASONE) 10 MG tablet Take 6 tabs PO on day 1&2, 5 tabs PO on day 3&4, 4 tabs PO on day 5&6, 3 tabs PO on day 7&8, 2 tabs PO on day 9&10, 1 tab PO on day 11&12. 42 tablet 0   No facility-administered medications prior to visit.     No Known Allergies  Review of Systems  Constitutional: Negative for chills, diaphoresis and fever.  HENT: Positive for congestion. Negative for ear discharge, ear pain, sinus pain and sore throat.   Eyes: Negative.   Respiratory: Positive for cough, sputum production, shortness of breath and wheezing.   Cardiovascular: Negative.   Skin: Negative.   Neurological: Negative for dizziness and headaches.       Objective:    Physical Exam  Constitutional: She appears well-developed and well-nourished. No distress.  HENT:  Head: Normocephalic and atraumatic.  Eyes: Conjunctivae and EOM are normal. No scleral icterus.  Neck: Normal range of motion.  Pulmonary/Chest: Effort normal.  Skin: She is not diaphoretic.  Psychiatric: She has a normal mood and affect.  Her behavior is normal. Judgment and thought content normal.  Vitals reviewed.   LMP 02/10/2015 Comment: 1st one since dec 2014 Wt Readings from Last 3 Encounters:  12/08/18 194 lb 9.6 oz (88.3 kg)  11/11/18 193 lb (87.5 kg)  09/29/18 188 lb (85.3 kg)    Health Maintenance Due  Topic Date Due  . Hepatitis C Screening  1961-01-04  . HIV Screening  08/28/1977  . MAMMOGRAM  08/28/2012  . PAP SMEAR-Modifier  11/21/2017    There are  no preventive care reminders to display for this patient.   No results found for: TSH Lab Results  Component Value Date   WBC 14.5 (H) 05/15/2015   HGB 12.1 05/15/2015   HCT 38.8 05/15/2015   MCV 77.3 (L) 05/15/2015   PLT 329 05/15/2015   Lab Results  Component Value Date   NA 137 05/15/2015   K 4.2 05/15/2015   CO2 23 05/15/2015   GLUCOSE 121 (H) 05/15/2015   BUN 9 05/15/2015   CREATININE 0.89 05/15/2015   BILITOT 0.3 05/15/2015   ALKPHOS 86 05/15/2015   AST 24 05/15/2015   ALT 24 05/15/2015   PROT 7.5 05/15/2015   ALBUMIN 3.9 05/15/2015   CALCIUM 8.7 (L) 05/15/2015   ANIONGAP 9 05/15/2015   No results found for: CHOL No results found for: HDL No results found for: LDLCALC No results found for: TRIG No results found for: CHOLHDL No results found for: HGBA1C     Assessment & Plan:   1. Moderate persistent asthmatic bronchitis with acute exacerbation Worsening symptoms. Will treat with Bactrim, prednisone and hycodan cough syrup as below. Continue inhalers as prescribed. Call if worsening.  - predniSONE (DELTASONE) 10 MG tablet; Take 6 tabs PO on day 1&2, 5 tabs PO on day 3&4, 4 tabs PO on day 5&6, 3 tabs PO on day 7&8, 2 tabs PO on day 9&10, 1 tab PO on day 11&12.  Dispense: 42 tablet; Refill: 0 - HYDROcodone-homatropine (HYCODAN) 5-1.5 MG/5ML syrup; Take 5 mLs by mouth every 8 (eight) hours as needed for cough.  Dispense: 120 mL; Refill: 0 - sulfamethoxazole-trimethoprim (BACTRIM DS) 800-160 MG tablet;  Take 1 tablet by mouth 2  (two) times daily.  Dispense: 20 tablet; Refill: 0  I discussed the assessment and treatment plan with the patient. The patient was provided an opportunity to ask questions and all were answered. The patient agreed with the plan and demonstrated an understanding of the instructions.   The patient was advised to call back or seek an in-person evaluation if the symptoms worsen or if the condition fails to improve as anticipated.  I provided 13 minutes of non-face-to-face time during this encounter.    Mar Daring, PA-C

## 2019-04-19 NOTE — Progress Notes (Signed)
Patient: Angela Reyes Female    DOB: September 12, 1961   58 y.o.   MRN: [id number] Visit Date: 04/20/2019  Today's Provider: Mar Daring, PA-C   Chief Complaint  Patient presents with  . Vaginal Irritation   Subjective:     Female GU Problem The patient's pertinent negatives include no pelvic pain or vaginal discharge. Primary symptoms comment: vaginal irritation, feels "raw". This is a new problem. The current episode started 1 to 4 weeks ago. The problem occurs constantly. The problem has been unchanged. She is not pregnant. Associated symptoms include painful intercourse. Pertinent negatives include no dysuria, flank pain, frequency, hematuria or urgency. She is sexually active. She uses hysterectomy for contraception. (Recent antibiotic use)   She reports she was noticing the vaginal irritation and dyspareunia prior to the antibiotic use but after the antibiotic it has worsened. She does report using OTC lubrication during intercourse.   No Known Allergies   Current Outpatient Medications:  .  albuterol (PROAIR HFA) 108 (90 Base) MCG/ACT inhaler, Inhale 2 puffs into the lungs every 6 (six) hours as needed for wheezing or shortness of breath., Disp: 1 Inhaler, Rfl: 5 .  albuterol (PROVENTIL) (2.5 MG/3ML) 0.083% nebulizer solution, Take 3 mLs (2.5 mg total) by nebulization every 6 (six) hours as needed for wheezing or shortness of breath., Disp: 75 mL, Rfl: 1 .  BREO ELLIPTA 200-25 MCG/INH AEPB, INHALE 1 PUFF INTO THE LUNGS EVERY EVENING., Disp: 60 each, Rfl: 3 .  cetirizine (ZYRTEC) 10 MG tablet, Take 10 mg by mouth at bedtime. Reported on 05/01/2016, Disp: , Rfl:  .  Chlorpheniramine Maleate (ALLERGY PO), Take by mouth daily. Drops, Disp: , Rfl:  .  esomeprazole (NEXIUM) 40 MG capsule, TAKE 1 CAPSULE (40 MG TOTAL) BY MOUTH DAILY AT 12 NOON., Disp: 90 capsule, Rfl: 3 .  Fluticasone Propionate (XHANCE) 93 MCG/ACT EXHU, Place into the nose., Disp: , Rfl:  .  meloxicam (MOBIC)  15 MG tablet, TAKE 1 TABLET BY MOUTH EVERY DAY, Disp: 90 tablet, Rfl: 1 .  montelukast (SINGULAIR) 10 MG tablet, TAKE 1 TABLET (10 MG TOTAL) BY MOUTH AT BEDTIME., Disp: 90 tablet, Rfl: 2  Review of Systems  Constitutional: Negative.   Respiratory: Negative.   Cardiovascular: Negative.   Genitourinary: Positive for dyspareunia (only due to vaginal dryness/rawness) and vaginal pain. Negative for decreased urine volume, dysuria, enuresis, flank pain, frequency, genital sores, hematuria, menstrual problem, pelvic pain, urgency, vaginal bleeding and vaginal discharge.  Musculoskeletal: Negative.     Social History   Tobacco Use  . Smoking status: Never Smoker  . Smokeless tobacco: Never Used  Substance Use Topics  . Alcohol use: No      Objective:   BP (!) 146/87 (BP Location: Left Arm, Patient Position: Sitting, Cuff Size: Large)   Pulse 75   Temp 97.6 F (36.4 C) (Oral)   Wt 200 lb 9.6 oz (91 kg)   LMP 02/10/2015 Comment: 1st one since dec 2014  BMI 29.20 kg/m  Vitals:   04/20/19 1435  BP: (!) 146/87  Pulse: 75  Temp: 97.6 F (36.4 C)  TempSrc: Oral  Weight: 200 lb 9.6 oz (91 kg)     Physical Exam Vitals signs reviewed.  Constitutional:      General: She is not in acute distress.    Appearance: Normal appearance. She is well-developed and normal weight. She is not ill-appearing or diaphoretic.  Neck:     Musculoskeletal: Normal range of motion  and neck supple.  Cardiovascular:     Rate and Rhythm: Normal rate and regular rhythm.     Heart sounds: Normal heart sounds. No murmur. No friction rub. No gallop.   Pulmonary:     Effort: Pulmonary effort is normal. No respiratory distress.     Breath sounds: Wheezing (mild expiratory wheezes heard faintly in upper lobes bilaterally) present. No rales.  Neurological:     Mental Status: She is alert.     No results found for any visits on 04/20/19.     Assessment & Plan    1. Vaginal atrophy Suspect symptoms most  likely from post menopausal vaginal atrophy. Could have been exacerbated from the recent antibiotic and possible yeast infection as well. Will treat with diflucan as below first for the yeast infection and then will start premarin cream as below. Advised to start with nightly use x 2 weeks then may decrease use to M,W,F and see if symptoms remain controlled. She agrees with plan. Call if not improving.  - conjugated estrogens (PREMARIN) vaginal cream; Place 1 Applicatorful vaginally daily.  Dispense: 42.5 g; Refill: 12  2. Antibiotic-induced yeast infection See above medical treatment plan. - fluconazole (DIFLUCAN) 150 MG tablet; Take 1 tablet (150 mg total) by mouth daily.  Dispense: 3 tablet; Refill: 0     Mar Daring, PA-C  [facility]

## 2019-04-20 ENCOUNTER — Other Ambulatory Visit: Payer: Self-pay

## 2019-04-20 ENCOUNTER — Encounter: Payer: Self-pay | Admitting: Physician Assistant

## 2019-04-20 ENCOUNTER — Ambulatory Visit: Payer: BC Managed Care – PPO | Admitting: Physician Assistant

## 2019-04-20 VITALS — BP 146/87 | HR 75 | Temp 97.6°F | Wt 200.6 lb

## 2019-04-20 DIAGNOSIS — T3695XA Adverse effect of unspecified systemic antibiotic, initial encounter: Secondary | ICD-10-CM

## 2019-04-20 DIAGNOSIS — B379 Candidiasis, unspecified: Secondary | ICD-10-CM | POA: Diagnosis not present

## 2019-04-20 DIAGNOSIS — N952 Postmenopausal atrophic vaginitis: Secondary | ICD-10-CM

## 2019-04-20 MED ORDER — FLUCONAZOLE 150 MG PO TABS: 150.0000 mg | ORAL_TABLET | Freq: Every day | ORAL | 0 refills | Status: DC

## 2019-04-20 MED ORDER — PREMARIN 0.625 MG/GM VA CREA: 1.0000 | TOPICAL_CREAM | Freq: Every day | VAGINAL | 12 refills | Status: DC

## 2019-04-20 NOTE — Patient Instructions (Signed)
Atrophic Vaginitis  Atrophic vaginitis is a condition in which the tissues that line the vagina become dry and thin. This condition is most common in women who have stopped having regular menstrual periods (are in menopause). This usually starts when a woman is 45-57 years old. That is the time when a woman's estrogen levels begin to drop (decrease). Estrogen is a female hormone. It helps to keep the tissues of the vagina moist. It stimulates the vagina to produce a clear fluid that lubricates the vagina for sexual intercourse. This fluid also protects the vagina from infection. Lack of estrogen can cause the lining of the vagina to get thinner and dryer. The vagina may also shrink in size. It may become less elastic. Atrophic vaginitis tends to get worse over time as a woman's estrogen level drops. What are the causes? This condition is caused by the normal drop in estrogen that happens around the time of menopause. What increases the risk? Certain conditions or situations may lower a woman's estrogen level, leading to a higher risk for atrophic vaginitis. You are more likely to develop this condition if:  You are taking medicines that block estrogen.  You have had your ovaries removed.  You are being treated for cancer with X-ray (radiation) or medicines (chemotherapy).  You have given birth or are breastfeeding.  You are older than age 50.  You smoke. What are the signs or symptoms? Symptoms of this condition include:  Pain, soreness, or bleeding during sexual intercourse (dyspareunia).  Vaginal burning, irritation, or itching.  Pain or bleeding when a speculum is used in a vaginal exam (pelvic exam).  Having burning pain when passing urine.  Vaginal discharge that is brown or yellow. In some cases, there are no symptoms. How is this diagnosed? This condition is diagnosed by taking a medical history and doing a physical exam. This will include a pelvic exam that checks the  vaginal tissues. Though rare, you may also have other tests, including:  A urine test.  A test that checks the acid balance in your vagina (acid balance test). How is this treated? Treatment for this condition depends on how severe your symptoms are. Treatment may include:  Using an over-the-counter vaginal lubricant before sex.  Using a long-acting vaginal moisturizer.  Using low-dose vaginal estrogen for moderate to severe symptoms that do not respond to other treatments. Options include creams, tablets, and inserts (vaginal rings). Before you use a vaginal estrogen, tell your health care provider if you have a history of: ? Breast cancer. ? Endometrial cancer. ? Blood clots. If you are not sexually active and your symptoms are very mild, you may not need treatment. Follow these instructions at home: Medicines  Take over-the-counter and prescription medicines only as told by your health care provider. Do not use herbal or alternative medicines unless your health care provider says that you can.  Use over-the-counter creams, lubricants, or moisturizers for dryness only as directed by your health care provider. General instructions  If your atrophic vaginitis is caused by menopause, discuss all of your menopause symptoms and treatment options with your health care provider.  Do not douche.  Do not use products that can make your vagina dry. These include: ? Scented feminine sprays. ? Scented tampons. ? Scented soaps.  Vaginal intercourse can help to improve blood flow and elasticity of vaginal tissue. If it hurts to have sex, try using a lubricant or moisturizer just before having intercourse. Contact a health care provider if:    Your discharge looks different than normal.  Your vagina has an unusual smell.  You have new symptoms.  Your symptoms do not improve with treatment.  Your symptoms get worse. Summary  Atrophic vaginitis is a condition in which the tissues that  line the vagina become dry and thin. It is most common in women who have stopped having regular menstrual periods (are in menopause).  Treatment options include using vaginal lubricants and low-dose vaginal estrogen.  Contact a health care provider if your vagina has an unusual smell, or if your symptoms get worse or do not improve after treatment. This information is not intended to replace advice given to you by your health care provider. Make sure you discuss any questions you have with your health care provider. Document Released: 02/21/2015 Document Revised: 09/19/2017 Document Reviewed: 07/03/2017 [employer] Patient Education  [employer]. Conjugated Estrogens vaginal cream What is this medicine? CONJUGATED ESTROGENS (CON ju gate ed ESS troe jenz) are a mixture of female hormones. This cream can help relieve symptoms associated with menopause.like vaginal dryness and irritation. This medicine may be used for other purposes; ask your health care provider or pharmacist if you have questions. COMMON BRAND NAME(S): Premarin What should I tell my health care provider before I take this medicine? They need to know if you have any of these conditions:  abnormal vaginal bleeding  blood vessel disease or blood clots  breast, cervical, endometrial, or uterine cancer  dementia  diabetes  gallbladder disease  heart disease or recent heart attack  high blood pressure  high cholesterol  high level of calcium in the blood  hysterectomy  kidney disease  liver disease  migraine headaches  protein C deficiency  protein S deficiency  stroke  systemic lupus erythematosus (SLE)  tobacco smoker  an unusual or allergic reaction to estrogens other medicines, foods, dyes, or preservatives  pregnant or trying to get pregnant  breast-feeding How should I use this medicine? This medicine is for use in the vagina only. Do not take by mouth. Follow the directions on the  prescription label. Use at bedtime unless otherwise directed by your doctor or health care professional. Use the special applicator supplied with the cream. Wash hands before and after use. Fill the applicator with the cream and remove from the tube. Lie on your back, part and bend your knees. Insert the applicator into the vagina and push the plunger to expel the cream into the vagina. Wash the applicator with warm soapy water and rinse well. Use exactly as directed for the complete length of time prescribed. Do not stop using except on the advice of your doctor or health care professional. Talk to your pediatrician regarding the use of this medicine in children. Special care may be needed. A patient package insert for the product will be given with each prescription and refill. Read this sheet carefully each time. The sheet may change frequently. Overdosage: If you think you have taken too much of this medicine contact a poison control center or emergency room at once. NOTE: This medicine is only for you. Do not share this medicine with others. What if I miss a dose? If you miss a dose, use it as soon as you can. If it is almost time for your next dose, use only that dose. Do not use double or extra doses. What may interact with this medicine? Do not take this medicine with any of the following medications:  aromatase inhibitors like aminoglutethimide, anastrozole, exemestane, letrozole,  testolactone This medicine may also interact with the following medications:  barbiturates used for inducing sleep or treating seizures  carbamazepine  grapefruit juice  medicines for fungal infections like itraconazole and ketoconazole  raloxifene or tamoxifen  rifabutin  rifampin  rifapentine  ritonavir  some antibiotics used to treat infections  St. John's Wort  warfarin This list may not describe all possible interactions. Give your health care provider a list of all the medicines, herbs,  non-prescription drugs, or dietary supplements you use. Also tell them if you smoke, drink alcohol, or use illegal drugs. Some items may interact with your medicine. What should I watch for while using this medicine? Visit your health care professional for regular checks on your progress. You will need a regular breast and pelvic exam. You should also discuss the need for regular mammograms with your health care professional, and follow his or her guidelines. This medicine can make your body retain fluid, making your fingers, hands, or ankles swell. Your blood pressure can go up. Contact your doctor or health care professional if you feel you are retaining fluid. If you have any reason to think you are pregnant; stop taking this medicine at once and contact your doctor or health care professional. Tobacco smoking increases the risk of getting a blood clot or having a stroke, especially if you are more than 57 years old. You are strongly advised not to smoke. If you wear contact lenses and notice visual changes, or if the lenses begin to feel uncomfortable, consult your eye care specialist. If you are going to have elective surgery, you may need to stop taking this medicine beforehand. Consult your health care professional for advice prior to scheduling the surgery. What side effects may I notice from receiving this medicine? Side effects that you should report to your doctor or health care professional as soon as possible:  allergic reactions like skin rash, itching or hives, swelling of the face, lips, or tongue  breast tissue changes or discharge  changes in vision  chest pain  confusion, trouble speaking or understanding  dark urine  general ill feeling or flu-like symptoms  light-colored stools  nausea, vomiting  pain, swelling, warmth in the leg  right upper belly pain  severe headaches  shortness of breath  sudden numbness or weakness of the face, arm or leg  trouble  walking, dizziness, loss of balance or coordination  unusual vaginal bleeding  yellowing of the eyes or skin Side effects that usually do not require medical attention (report to your doctor or health care professional if they continue or are bothersome):  hair loss  increased hunger or thirst  increased urination  symptoms of vaginal infection like itching, irritation or unusual discharge  unusually weak or tired This list may not describe all possible side effects. Call your doctor for medical advice about side effects. You may report side effects to FDA at 1-800-FDA-1088. Where should I keep my medicine? Keep out of the reach of children. Store at room temperature between 15 and 30 degrees C (59 and 86 degrees F). Throw away any unused medicine after the expiration date. NOTE: This sheet is a summary. It may not cover all possible information. If you have questions about this medicine, talk to your doctor, pharmacist, or health care provider.  2020 Elsevier/[employer] Standard (2011-01-09 09:20:36)

## 2019-04-26 ENCOUNTER — Encounter: Payer: Self-pay | Admitting: Physician Assistant

## 2019-04-26 DIAGNOSIS — J4541 Moderate persistent asthma with (acute) exacerbation: Secondary | ICD-10-CM

## 2019-04-27 MED ORDER — PREDNISONE 10 MG PO TABS: ORAL_TABLET | ORAL | 0 refills | Status: DC

## 2019-05-23 ENCOUNTER — Other Ambulatory Visit: Payer: Self-pay | Admitting: Physician Assistant

## 2019-05-23 DIAGNOSIS — K219 Gastro-esophageal reflux disease without esophagitis: Secondary | ICD-10-CM

## 2019-05-26 ENCOUNTER — Telehealth (INDEPENDENT_AMBULATORY_CARE_PROVIDER_SITE_OTHER): Payer: BC Managed Care – PPO | Admitting: Physician Assistant

## 2019-05-26 ENCOUNTER — Encounter: Payer: Self-pay | Admitting: Physician Assistant

## 2019-05-26 ENCOUNTER — Other Ambulatory Visit: Payer: Self-pay

## 2019-05-26 DIAGNOSIS — Z20828 Contact with and (suspected) exposure to other viral communicable diseases: Secondary | ICD-10-CM | POA: Diagnosis not present

## 2019-05-26 DIAGNOSIS — Z20822 Contact with and (suspected) exposure to covid-19: Secondary | ICD-10-CM

## 2019-05-26 DIAGNOSIS — B379 Candidiasis, unspecified: Secondary | ICD-10-CM | POA: Diagnosis not present

## 2019-05-26 DIAGNOSIS — T3695XA Adverse effect of unspecified systemic antibiotic, initial encounter: Secondary | ICD-10-CM

## 2019-05-26 DIAGNOSIS — J4541 Moderate persistent asthma with (acute) exacerbation: Secondary | ICD-10-CM | POA: Diagnosis not present

## 2019-05-26 MED ORDER — AZITHROMYCIN 250 MG PO TABS: ORAL_TABLET | ORAL | 0 refills | Status: DC

## 2019-05-26 MED ORDER — PREDNISONE 10 MG PO TABS: ORAL_TABLET | ORAL | 0 refills | Status: DC

## 2019-05-26 MED ORDER — FLUCONAZOLE 150 MG PO TABS: 150.0000 mg | ORAL_TABLET | Freq: Every day | ORAL | 0 refills | Status: DC

## 2019-05-26 NOTE — Patient Instructions (Addendum)
COVID-19 COVID-19 is a respiratory infection that is caused by a virus called severe acute respiratory syndrome coronavirus 2 (SARS-CoV-2). The disease is also known as coronavirus disease or novel coronavirus. In some people, the virus may not cause any symptoms. In others, it may cause a serious infection. The infection can get worse quickly and can lead to complications, such as:  Pneumonia, or infection of the lungs.  Acute respiratory distress syndrome or ARDS. This is fluid build-up in the lungs.  Acute respiratory failure. This is a condition in which there is not enough oxygen passing from the lungs to the body.  Sepsis or septic shock. This is a serious bodily reaction to an infection.  Blood clotting problems.  Secondary infections due to bacteria or fungus. The virus that causes COVID-19 is contagious. This means that it can spread from person to person through droplets from coughs and sneezes (respiratory secretions). What are the causes? This illness is caused by a virus. You may catch the virus by:  Breathing in droplets from an infected person's cough or sneeze.  Touching something, like a table or a doorknob, that was exposed to the virus (contaminated) and then touching your mouth, nose, or eyes. What increases the risk? Risk for infection You are more likely to be infected with this virus if you:  Live in or travel to an area with a COVID-19 outbreak.  Come in contact with a sick person who recently traveled to an area with a COVID-19 outbreak.  Provide care for or live with a person who is infected with COVID-19. Risk for serious illness You are more likely to become seriously ill from the virus if you:  Are 65 years of age or older.  Have a long-term disease that lowers your body's ability to fight infection (immunocompromised).  Live in a nursing home or long-term care facility.  Have a long-term (chronic) disease such as: ? Chronic lung disease, including  chronic obstructive pulmonary disease or asthma ? Heart disease. ? Diabetes. ? Chronic kidney disease. ? Liver disease.  Are obese. What are the signs or symptoms? Symptoms of this condition can range from mild to severe. Symptoms may appear any time from 2 to 14 days after being exposed to the virus. They include:  A fever.  A cough.  Difficulty breathing.  Chills.  Muscle pains.  A sore throat.  Loss of taste or smell. Some people may also have stomach problems, such as nausea, vomiting, or diarrhea. Other people may not have any symptoms of COVID-19. How is this diagnosed? This condition may be diagnosed based on:  Your signs and symptoms, especially if: ? You live in an area with a COVID-19 outbreak. ? You recently traveled to or from an area where the virus is common. ? You provide care for or live with a person who was diagnosed with COVID-19.  A physical exam.  Lab tests, which may include: ? A nasal swab to take a sample of fluid from your nose. ? A throat swab to take a sample of fluid from your throat. ? A sample of mucus from your lungs (sputum). ? Blood tests.  Imaging tests, which may include, X-rays, CT scan, or ultrasound. How is this treated? At present, there is no medicine to treat COVID-19. Medicines that treat other diseases are being used on a trial basis to see if they are effective against COVID-19. Your health care provider will talk with you about ways to treat your symptoms. For most   people, the infection is mild and can be managed at home with rest, fluids, and over-the-counter medicines. Treatment for a serious infection usually takes places in a hospital intensive care unit (ICU). It may include one or more of the following treatments. These treatments are given until your symptoms improve.  Receiving fluids and medicines through an IV.  Supplemental oxygen. Extra oxygen is given through a tube in the nose, a face mask, or a hood.   Positioning you to lie on your stomach (prone position). This makes it easier for oxygen to get into the lungs.  Continuous positive airway pressure (CPAP) or bi-level positive airway pressure (BPAP) machine. This treatment uses mild air pressure to keep the airways open. A tube that is connected to a motor delivers oxygen to the body.  Ventilator. This treatment moves air into and out of the lungs by using a tube that is placed in your windpipe.  Tracheostomy. This is a procedure to create a hole in the neck so that a breathing tube can be inserted.  Extracorporeal membrane oxygenation (ECMO). This procedure gives the lungs a chance to recover by taking over the functions of the heart and lungs. It supplies oxygen to the body and removes carbon dioxide. Follow these instructions at home: Lifestyle  If you are sick, stay home except to get medical care. Your health care provider will tell you how long to stay home. Call your health care provider before you go for medical care.  Rest at home as told by your health care provider.  Do not use any products that contain nicotine or tobacco, such as cigarettes, e-cigarettes, and chewing tobacco. If you need help quitting, ask your health care provider.  Return to your normal activities as told by your health care provider. Ask your health care provider what activities are safe for you. General instructions  Take over-the-counter and prescription medicines only as told by your health care provider.  Drink enough fluid to keep your urine pale yellow.  Keep all follow-up visits as told by your health care provider. This is important. How is this prevented?  There is no vaccine to help prevent COVID-19 infection. However, there are steps you can take to protect yourself and others from this virus. To protect yourself:   Do not travel to areas where COVID-19 is a risk. The areas where COVID-19 is reported change often. To identify high-risk areas  and travel restrictions, check the CDC travel website: FatFares.com.br  If you live in, or must travel to, an area where COVID-19 is a risk, take precautions to avoid infection. ? Stay away from people who are sick. ? Wash your hands often with soap and water for 20 seconds. If soap and water are not available, use an alcohol-based hand sanitizer. ? Avoid touching your mouth, face, eyes, or nose. ? Avoid going out in public, follow guidance from your state and local health authorities. ? If you must go out in public, wear a cloth face covering or face mask. ? Disinfect objects and surfaces that are frequently touched every day. This may include:  Counters and tables.  Doorknobs and light switches.  Sinks and faucets.  Electronics, such as phones, remote controls, keyboards, computers, and tablets. To protect others: If you have symptoms of COVID-19, take steps to prevent the virus from spreading to others.  If you think you have a COVID-19 infection, contact your health care provider right away. Tell your health care team that you think you may  have a COVID-19 infection.  Stay home. Leave your house only to seek medical care. Do not use public transport.  Do not travel while you are sick.  Wash your hands often with soap and water for 20 seconds. If soap and water are not available, use alcohol-based hand sanitizer.  Stay away from other members of your household. Let healthy household members care for children and pets, if possible. If you have to care for children or pets, wash your hands often and wear a mask. If possible, stay in your own room, separate from others. Use a different bathroom.  Make sure that all people in your household wash their hands well and often.  Cough or sneeze into a tissue or your sleeve or elbow. Do not cough or sneeze into your hand or into the air.  Wear a cloth face covering or face mask. Where to find more information  Centers for  Disease Control and Prevention: PurpleGadgets.be  World Health Organization: https://www.castaneda.info/ Contact a health care provider if:  You live in or have traveled to an area where COVID-19 is a risk and you have symptoms of the infection.  You have had contact with someone who has COVID-19 and you have symptoms of the infection. Get help right away if:  You have trouble breathing.  You have pain or pressure in your chest.  You have confusion.  You have bluish lips and fingernails.  You have difficulty waking from sleep.  You have symptoms that get worse. These symptoms may represent a serious problem that is an emergency. Do not wait to see if the symptoms will go away. Get medical help right away. Call your local emergency services (911 in the [place] Do not drive yourself to the hospital. Let the emergency medical personnel know if you think you have COVID-19. Summary  COVID-19 is a respiratory infection that is caused by a virus. It is also known as coronavirus disease or novel coronavirus. It can cause serious infections, such as pneumonia, acute respiratory distress syndrome, acute respiratory failure, or sepsis.  The virus that causes COVID-19 is contagious. This means that it can spread from person to person through droplets from coughs and sneezes.  You are more likely to develop a serious illness if you are 25 years of age or older, have a weak immunity, live in a nursing home, or have chronic disease.  There is no medicine to treat COVID-19. Your health care provider will talk with you about ways to treat your symptoms.  Take steps to protect yourself and others from infection. Wash your hands often and disinfect objects and surfaces that are frequently touched every day. Stay away from people who are sick and wear a mask if you are sick. This information is not intended to replace advice given to you by your health care provider.  Make sure you discuss any questions you have with your health care provider. Document Released: 11/12/2018 Document Revised: 03/04/2019 Document Reviewed: 11/12/2018 Elsevier Patient Education  2020 [employer].   COVID-19 Frequently Asked Questions COVID-19 (coronavirus disease) is an infection that is caused by a large family of viruses. Some viruses cause illness in people and others cause illness in animals like camels, cats, and bats. In some cases, the viruses that cause illness in animals can spread to humans. Where did the coronavirus come from? In December 2019, [place] told the [facility]) of several cases of lung disease (human respiratory illness). These cases were linked to an  open seafood and livestock market in the city of [place]. The link to the seafood and livestock market suggests that the virus may have spread from animals to humans. However, since that first outbreak in December, the virus has also been shown to spread from person to person. What is the name of the disease and the virus? Disease name Early on, this disease was called novel coronavirus. This is because scientists determined that the disease was caused by a new (novel) respiratory virus. The World Health Organization [facility]) has now named the disease COVID-19, or coronavirus disease. Virus name The virus that causes the disease is called severe acute respiratory syndrome coronavirus 2 (SARS-CoV-2). More information on disease and virus naming World Health Organization [facility]): www.who.int/emergencies/diseases/novel-coronavirus-2019/technical-guidance/naming-the-coronavirus-disease-(covid-2019)-and-the-virus-that-causes-it Who is at risk for complications from coronavirus disease? Some people may be at higher risk for complications from coronavirus disease. This includes older adults and people who have chronic diseases, such as heart disease, diabetes, and lung disease. If you are at higher risk for  complications, take these extra precautions:  Avoid close contact with people who are sick or have a fever or cough. Stay at least 3-6 ft (1-2 m) away from them, if possible.  Wash your hands often with soap and water for at least 20 seconds.  Avoid touching your face, mouth, nose, or eyes.  Keep supplies on hand at home, such as food, medicine, and cleaning supplies.  Stay home as much as possible.  Avoid social gatherings and travel. How does coronavirus disease spread? The virus that causes coronavirus disease spreads easily from person to person (is contagious). There are also cases of community-spread disease. This means the disease has spread to:  People who have no known contact with other infected people.  People who have not traveled to areas where there are known cases. It appears to spread from one person to another through droplets from coughing or sneezing. Can I get the virus from touching surfaces or objects? There is still a lot that we do not know about the virus that causes coronavirus disease. Scientists are basing a lot of information on what they know about similar viruses, such as:  Viruses cannot generally survive on surfaces for long. They need a human body (host) to survive.  It is more likely that the virus is spread by close contact with people who are sick (direct contact), such as through: ? Shaking hands or hugging. ? Breathing in respiratory droplets that travel through the air. This can happen when an infected person coughs or sneezes on or near other people.  It is less likely that the virus is spread when a person touches a surface or object that has the virus on it (indirect contact). The virus may be able to enter the body if the person touches a surface or object and then touches his or her face, eyes, nose, or mouth. Can a person spread the virus without having symptoms of the disease? It may be possible for the virus to spread before a person has  symptoms of the disease, but this is most likely not the main way the virus is spreading. It is more likely for the virus to spread by being in close contact with people who are sick and breathing in the respiratory droplets of a sick person's cough or sneeze. What are the symptoms of coronavirus disease? Symptoms vary from person to person and can range from mild to severe. Symptoms may include:  Fever.  Cough.  Tiredness, weakness,  or fatigue.  Fast breathing or feeling short of breath. These symptoms can appear anywhere from 2 to 14 days after you have been exposed to the virus. If you develop symptoms, call your health care provider. People with severe symptoms may need hospital care. If I am exposed to the virus, how long does it take before symptoms start? Symptoms of coronavirus disease may appear anywhere from 2 to 14 days after a person has been exposed to the virus. If you develop symptoms, call your health care provider. Should I be tested for this virus? Your health care provider will decide whether to test you based on your symptoms, history of exposure, and your risk factors. How does a health care provider test for this virus? Health care providers will collect samples to send for testing. Samples may include:  Taking a swab of fluid from the nose.  Taking fluid from the lungs by having you cough up mucus (sputum) into a sterile cup.  Taking a blood sample.  Taking a stool or urine sample. Is there a treatment or vaccine for this virus? Currently, there is no vaccine to prevent coronavirus disease. Also, there are no medicines like antibiotics or antivirals to treat the virus. A person who becomes sick is given supportive care, which means rest and fluids. A person may also relieve his or her symptoms by using over-the-counter medicines that treat sneezing, coughing, and runny nose. These are the same medicines that a person takes for the common cold. If you develop  symptoms, call your health care provider. People with severe symptoms may need hospital care. What can I do to protect myself and my family from this virus?     You can protect yourself and your family by taking the same actions that you would take to prevent the spread of other viruses. Take the following actions:  Wash your hands often with soap and water for at least 20 seconds. If soap and water are not available, use alcohol-based hand sanitizer.  Avoid touching your face, mouth, nose, or eyes.  Cough or sneeze into a tissue, sleeve, or elbow. Do not cough or sneeze into your hand or the air. ? If you cough or sneeze into a tissue, throw it away immediately and wash your hands.  Disinfect objects and surfaces that you frequently touch every day.  Avoid close contact with people who are sick or have a fever or cough. Stay at least 3-6 ft (1-2 m) away from them, if possible.  Stay home if you are sick, except to get medical care. Call your health care provider before you get medical care.  Make sure your vaccines are up to date. Ask your health care provider what vaccines you need. What should I do if I need to travel? Follow travel recommendations from your local health authority, the CDC, and WHO. Travel information and advice  Centers for Disease Control and Prevention (CDC): BodyEditor.hu  World Health Organization [facility]): ThirdIncome.ca Know the risks and take action to protect your health  You are at higher risk of getting coronavirus disease if you are traveling to areas with an outbreak or if you are exposed to travelers from areas with an outbreak.  Wash your hands often and practice good hygiene to lower the risk of catching or spreading the virus. What should I do if I am sick? General instructions to stop the spread of infection  Wash your hands often with soap and water  for at least 20 seconds.  If soap and water are not available, use alcohol-based hand sanitizer.  Cough or sneeze into a tissue, sleeve, or elbow. Do not cough or sneeze into your hand or the air.  If you cough or sneeze into a tissue, throw it away immediately and wash your hands.  Stay home unless you must get medical care. Call your health care provider or local health authority before you get medical care.  Avoid public areas. Do not take public transportation, if possible.  If you can, wear a mask if you must go out of the house or if you are in close contact with someone who is not sick. Keep your home clean  Disinfect objects and surfaces that are frequently touched every day. This may include: ? Counters and tables. ? Doorknobs and light switches. ? Sinks and faucets. ? Electronics such as phones, remote controls, keyboards, computers, and tablets.  Wash dishes in hot, soapy water or use a dishwasher. Air-dry your dishes.  Wash laundry in hot water. Prevent infecting other household members  Let healthy household members care for children and pets, if possible. If you have to care for children or pets, wash your hands often and wear a mask.  Sleep in a different bedroom or bed, if possible.  Do not share personal items, such as razors, toothbrushes, deodorant, combs, brushes, towels, and washcloths. Where to find more information Centers for Disease Control and Prevention (CDC)  Information and news updates: https://www.butler-gonzalez.com/ World Health Organization [facility])  Information and news updates: MissExecutive.com.ee  Coronavirus health topic: https://www.castaneda.info/  Questions and answers on COVID-19: OpportunityDebt.at  Global tracker: who.sprinklr.com American Academy of Pediatrics (AAP)  Information for families:  www.healthychildren.org/English/health-issues/conditions/chest-lungs/Pages/2019-Novel-Coronavirus.aspx The coronavirus situation is changing rapidly. Check your local health authority website or the CDC and [facility] websites for updates and news. When should I contact a health care provider?  Contact your health care provider if you have symptoms of an infection, such as fever or cough, and you: ? Have been near anyone who is known to have coronavirus disease. ? Have come into contact with a person who is suspected to have coronavirus disease. ? Have traveled outside of the country. When should I get emergency medical care?  Get help right away by calling your local emergency services (911 in the [place] if you have: ? Trouble breathing. ? Pain or pressure in your chest. ? Confusion. ? Blue-tinged lips and fingernails. ? Difficulty waking from sleep. ? Symptoms that get worse. Let the emergency medical personnel know if you think you have coronavirus disease. Summary  A new respiratory virus is spreading from person to person and causing COVID-19 (coronavirus disease).  The virus that causes COVID-19 appears to spread easily. It spreads from one person to another through droplets from coughing or sneezing.  Older adults and those with chronic diseases are at higher risk of disease. If you are at higher risk for complications, take extra precautions.  There is currently no vaccine to prevent coronavirus disease. There are no medicines, such as antibiotics or antivirals, to treat the virus.  You can protect yourself and your family by washing your hands often, avoiding touching your face, and covering your coughs and sneezes. This information is not intended to replace advice given to you by your health care provider. Make sure you discuss any questions you have with your health care provider. Document Released: 02/02/2019 Document Revised: 02/02/2019 Document Reviewed: 02/02/2019 Elsevier  Patient Education  2020 [employer].     Person Under  Monitoring Name: Angela Reyes  Location: [place] [address]   Infection Prevention Recommendations for Individuals Confirmed to have, or Being Evaluated for, 2019 Novel Coronavirus (COVID-19) Infection Who Receive Care at Home  Individuals who are confirmed to have, or are being evaluated for, COVID-19 should follow the prevention steps below until a healthcare provider or local or state health department says they can return to normal activities.  Stay home except to get medical care You should restrict activities outside your home, except for getting medical care. Do not go to work, school, or public areas, and do not use public transportation or taxis.  Call ahead before visiting your doctor Before your medical appointment, call the healthcare provider and tell them that you have, or are being evaluated for, COVID-19 infection. This will help the healthcare provider's office take steps to keep other people from getting infected. Ask your healthcare provider to call the local or state health department.  Monitor your symptoms Seek prompt medical attention if your illness is worsening (e.g., difficulty breathing). Before going to your medical appointment, call the healthcare provider and tell them that you have, or are being evaluated for, COVID-19 infection. Ask your healthcare provider to call the local or state health department.  Wear a facemask You should wear a facemask that covers your nose and mouth when you are in the same room with other people and when you visit a healthcare provider. People who live with or visit you should also wear a facemask while they are in the same room with you.  Separate yourself from other people in your home As much as possible, you should stay in a different room from other people in your home. Also, you should use a separate bathroom, if available.  Avoid  sharing household items You should not share dishes, drinking glasses, cups, eating utensils, towels, bedding, or other items with other people in your home. After using these items, you should wash them thoroughly with soap and water.  Cover your coughs and sneezes Cover your mouth and nose with a tissue when you cough or sneeze, or you can cough or sneeze into your sleeve. Throw used tissues in a lined trash can, and immediately wash your hands with soap and water for at least 20 seconds or use an alcohol-based hand rub.  Wash your [facility] your hands often and thoroughly with soap and water for at least 20 seconds. You can use an alcohol-based hand sanitizer if soap and water are not available and if your hands are not visibly dirty. Avoid touching your eyes, nose, and mouth with unwashed hands.   Prevention Steps for Caregivers and Household Members of Individuals Confirmed to have, or Being Evaluated for, COVID-19 Infection Being Cared for in the Home  If you live with, or provide care at home for, a person confirmed to have, or being evaluated for, COVID-19 infection please follow these guidelines to prevent infection:  Follow healthcare provider's instructions Make sure that you understand and can help the patient follow any healthcare provider instructions for all care.  Provide for the patient's basic needs You should help the patient with basic needs in the home and provide support for getting groceries, prescriptions, and other personal needs.  Monitor the patient's symptoms If they are getting sicker, call his or her medical provider and tell them that the patient has, or is being evaluated for, COVID-19 infection. This will help the healthcare provider's office take steps to keep  other people from getting infected. Ask the healthcare provider to call the local or state health department.  Limit the number of people who have contact with the patient  If possible, have  only one caregiver for the patient.  Other household members should stay in another home or place of residence. If this is not possible, they should stay  in another room, or be separated from the patient as much as possible. Use a separate bathroom, if available.  Restrict visitors who do not have an essential need to be in the home.  Keep older adults, very young children, and other sick people away from the patient Keep older adults, very young children, and those who have compromised immune systems or chronic health conditions away from the patient. This includes people with chronic heart, lung, or kidney conditions, diabetes, and cancer.  Ensure good ventilation Make sure that shared spaces in the home have good air flow, such as from an air conditioner or an opened window, weather permitting.  Wash your hands often  Wash your hands often and thoroughly with soap and water for at least 20 seconds. You can use an alcohol based hand sanitizer if soap and water are not available and if your hands are not visibly dirty.  Avoid touching your eyes, nose, and mouth with unwashed hands.  Use disposable paper towels to dry your hands. If not available, use dedicated cloth towels and replace them when they become wet.  Wear a facemask and gloves  Wear a disposable facemask at all times in the room and gloves when you touch or have contact with the patient's blood, body fluids, and/or secretions or excretions, such as sweat, saliva, sputum, nasal mucus, vomit, urine, or feces.  Ensure the mask fits over your nose and mouth tightly, and do not touch it during use.  Throw out disposable facemasks and gloves after using them. Do not reuse.  Wash your hands immediately after removing your facemask and gloves.  If your personal clothing becomes contaminated, carefully remove clothing and launder. Wash your hands after handling contaminated clothing.  Place all used disposable facemasks, gloves,  and other waste in a lined container before disposing them with other household waste.  Remove gloves and wash your hands immediately after handling these items.  Do not share dishes, glasses, or other household items with the patient  Avoid sharing household items. You should not share dishes, drinking glasses, cups, eating utensils, towels, bedding, or other items with a patient who is confirmed to have, or being evaluated for, COVID-19 infection.  After the person uses these items, you should wash them thoroughly with soap and water.  Wash laundry thoroughly  Immediately remove and wash clothes or bedding that have blood, body fluids, and/or secretions or excretions, such as sweat, saliva, sputum, nasal mucus, vomit, urine, or feces, on them.  Wear gloves when handling laundry from the patient.  Read and follow directions on labels of laundry or clothing items and detergent. In general, wash and dry with the warmest temperatures recommended on the label.  Clean all areas the individual has used often  Clean all touchable surfaces, such as counters, tabletops, doorknobs, bathroom fixtures, toilets, phones, keyboards, tablets, and bedside tables, every day. Also, clean any surfaces that may have blood, body fluids, and/or secretions or excretions on them.  Wear gloves when cleaning surfaces the patient has come in contact with.  Use a diluted bleach solution (e.g., dilute bleach with 1 part bleach and 10 parts  water) or a household disinfectant with a label that says EPA-registered for coronaviruses. To make a bleach solution at home, add 1 tablespoon of bleach to 1 quart (4 cups) of water. For a larger supply, add  cup of bleach to 1 gallon (16 cups) of water.  Read labels of cleaning products and follow recommendations provided on product labels. Labels contain instructions for safe and effective use of the cleaning product including precautions you should take when applying the  product, such as wearing gloves or eye protection and making sure you have good ventilation during use of the product.  Remove gloves and wash hands immediately after cleaning.  Monitor yourself for signs and symptoms of illness Caregivers and household members are considered close contacts, should monitor their health, and will be asked to limit movement outside of the home to the extent possible. Follow the monitoring steps for close contacts listed on the symptom monitoring form.   ? If you have additional questions, contact your local health department or call the epidemiologist on call at [phone] (available 24/7). ? This guidance is subject to change. For the most up-to-date guidance from [facility], please refer to their website: [email or web address]

## 2019-05-26 NOTE — Progress Notes (Signed)
Patient: Angela Reyes Female    DOB: 03-18-1961   58 y.o.   MRN: [id number] Visit Date: 05/26/2019  Today's Provider: Mar Daring, PA-C   No chief complaint on file.  Subjective:    I,Joseline E. Rosas,RMA am acting as a Education administrator for [employer], PA-C.  Virtual Visit via Video Note  I connected with [name] on 05/26/19 at  8:40 AM EDT by a video enabled telemedicine application and verified that I am speaking with the correct person using two identifiers.  Location: Patient: Home Provider: [facility]   I discussed the limitations of evaluation and management by telemedicine and the availability of in person appointments. The patient expressed understanding and agreed to proceed.   Cough This is a new (Patient expose to a positive Covid-19 person) problem. The current episode started 1 to 4 weeks ago (cough started last week-she thought it was her asthma). The problem has been gradually worsening. The problem occurs hourly. The cough is productive of sputum (and sometimes is dry). Associated symptoms include shortness of breath (a little with deep breath) and wheezing. Pertinent negatives include no fever, nasal congestion, postnasal drip or sore throat. The symptoms are aggravated by other and lying down (talking). She has tried a beta-agonist inhaler for the symptoms. Her past medical history is significant for asthma.   Patient was exposed to an employee that was symptomatic last week and tested positive yesterday.   No Known Allergies   Current Outpatient Medications:    albuterol (PROAIR HFA) 108 (90 Base) MCG/ACT inhaler, Inhale 2 puffs into the lungs every 6 (six) hours as needed for wheezing or shortness of breath., Disp: 1 Inhaler, Rfl: 5   albuterol (PROVENTIL) (2.5 MG/3ML) 0.083% nebulizer solution, Take 3 mLs (2.5 mg total) by nebulization every 6 (six) hours as needed for wheezing or shortness of breath., Disp: 75 mL, Rfl: 1   BREO ELLIPTA  200-25 MCG/INH AEPB, INHALE 1 PUFF INTO THE LUNGS EVERY EVENING., Disp: 60 each, Rfl: 3   cetirizine (ZYRTEC) 10 MG tablet, Take 10 mg by mouth at bedtime. Reported on 05/01/2016, Disp: , Rfl:    conjugated estrogens (PREMARIN) vaginal cream, Place 1 Applicatorful vaginally daily., Disp: 42.5 g, Rfl: 12   esomeprazole (NEXIUM) 40 MG capsule, TAKE 1 CAPSULE (40 MG TOTAL) BY MOUTH DAILY AT 12 NOON., Disp: 90 capsule, Rfl: 3   Fluticasone Propionate (XHANCE) 93 MCG/ACT EXHU, Place into the nose., Disp: , Rfl:    meloxicam (MOBIC) 15 MG tablet, TAKE 1 TABLET BY MOUTH EVERY DAY, Disp: 90 tablet, Rfl: 1   montelukast (SINGULAIR) 10 MG tablet, TAKE 1 TABLET (10 MG TOTAL) BY MOUTH AT BEDTIME., Disp: 90 tablet, Rfl: 2   Chlorpheniramine Maleate (ALLERGY PO), Take by mouth daily. Drops, Disp: , Rfl:    fluconazole (DIFLUCAN) 150 MG tablet, Take 1 tablet (150 mg total) by mouth daily. (Patient not taking: Reported on 05/26/2019), Disp: 3 tablet, Rfl: 0   predniSONE (DELTASONE) 10 MG tablet, Take 6 tabs PO on day 1&2, 5 tabs PO on day 3&4, 4 tabs PO on day 5&6, 3 tabs PO on day 7&8, 2 tabs PO on day 9&10, 1 tab PO on day 11&12. (Patient not taking: Reported on 05/26/2019), Disp: 42 tablet, Rfl: 0  Review of Systems  Constitutional: Negative for fatigue and fever.  HENT: Negative for postnasal drip and sore throat.   Respiratory: Positive for cough, shortness of breath (a little with deep  breath) and wheezing.   Cardiovascular: Negative.   Gastrointestinal: Negative.   Neurological: Negative.     Social History   Tobacco Use   Smoking status: Never Smoker   Smokeless tobacco: Never Used  Substance Use Topics   Alcohol use: No      Objective:   LMP 02/10/2015 Comment: 1st one since dec 2014 There were no vitals filed for this visit.   Physical Exam Vitals signs reviewed.  Constitutional:      General: She is not in acute distress.    Appearance: Normal appearance. She is  well-developed. She is not ill-appearing.  HENT:     Head: Normocephalic and atraumatic.  Neck:     Musculoskeletal: Normal range of motion and neck supple.  Pulmonary:     Effort: Pulmonary effort is normal. No respiratory distress.  Neurological:     Mental Status: She is alert.  Psychiatric:        Behavior: Behavior normal.        Thought Content: Thought content normal.        Judgment: Judgment normal.      No results found for any visits on 05/26/19.     Assessment & Plan     1. Exposure to Covid-19 Virus Home isolation guidelines discussed. Will f/u pending test results. - Novel Coronavirus, NAA ([facility])  2. Moderate persistent asthmatic bronchitis with acute exacerbation Worsening. Will treat with zpak, prednisone. Push fluids. Rest. Call if worsening. - predniSONE (DELTASONE) 10 MG tablet; Take 6 tabs PO on day 1&2, 5 tabs PO on day 3&4, 4 tabs PO on day 5&6, 3 tabs PO on day 7&8, 2 tabs PO on day 9&10, 1 tab PO on day 11&12.  Dispense: 42 tablet; Refill: 0 - azithromycin (ZITHROMAX) 250 MG tablet; Take 2 tablets PO on day one, and one tablet PO daily thereafter until completed.  Dispense: 6 tablet; Refill: 0  3. Antibiotic-induced yeast infection Gets yeast infections with antibiotics. Diflucan given as below.  - fluconazole (DIFLUCAN) 150 MG tablet; Take 1 tablet (150 mg total) by mouth daily.  Dispense: 3 tablet; Refill: 0  I discussed the assessment and treatment plan with the patient. The patient was provided an opportunity to ask questions and all were answered. The patient agreed with the plan and demonstrated an understanding of the instructions.   The patient was advised to call back or seek an in-person evaluation if the symptoms worsen or if the condition fails to improve as anticipated.  I provided 17 minutes of non-face-to-face time during this encounter.     Mar Daring, PA-C  [facility]

## 2019-05-27 LAB — NOVEL CORONAVIRUS, NAA: SARS-CoV-2, NAA: NOT DETECTED

## 2019-06-21 ENCOUNTER — Other Ambulatory Visit: Payer: Self-pay | Admitting: Physician Assistant

## 2019-06-21 DIAGNOSIS — J4540 Moderate persistent asthma, uncomplicated: Secondary | ICD-10-CM

## 2019-08-18 ENCOUNTER — Telehealth: Payer: Self-pay | Admitting: Physician Assistant

## 2019-08-18 DIAGNOSIS — J4541 Moderate persistent asthma with (acute) exacerbation: Secondary | ICD-10-CM

## 2019-08-18 MED ORDER — PREDNISONE 10 MG PO TABS: ORAL_TABLET | ORAL | 0 refills | Status: DC

## 2019-08-18 NOTE — Telephone Encounter (Signed)
Patient states that flare up began about a week ago, she reports that she has shortness of breath on exertion and a productive cough. Patient has been doing nebuluzation treatments at home as well as taking Breo and Pro-Air with little relief. Patient is asking if we can prescribe her prednisone? Patient states [facility] on [address]. KW

## 2019-08-18 NOTE — Telephone Encounter (Signed)
Patient has been advised. KW 

## 2019-08-18 NOTE — Telephone Encounter (Signed)
Pt having an asthma flair up.  No availability for an appt.  Please call pt back and advise at [phone] asap.  Thanks, TGh

## 2019-08-18 NOTE — Telephone Encounter (Signed)
Sent in prednisone for her 

## 2019-09-20 ENCOUNTER — Ambulatory Visit: Payer: Self-pay

## 2019-09-20 ENCOUNTER — Other Ambulatory Visit: Payer: Self-pay | Admitting: Physician Assistant

## 2019-09-20 DIAGNOSIS — J4541 Moderate persistent asthma with (acute) exacerbation: Secondary | ICD-10-CM

## 2019-09-20 DIAGNOSIS — J4540 Moderate persistent asthma, uncomplicated: Secondary | ICD-10-CM

## 2019-09-20 MED ORDER — PREDNISONE 10 MG PO TABS: ORAL_TABLET | ORAL | 0 refills | Status: DC

## 2019-09-20 NOTE — Telephone Encounter (Signed)
From [facility] 

## 2019-09-20 NOTE — Telephone Encounter (Signed)
Incoming call from Patient with complaint of asthma flaring up with in past week and half.   Rate moderate.  Patient would like a [facility] [address].  States it is constant and moderate.   Patient would like a [facility] [address] .  Related that Protocol recommended to go Urgent Care or Ed for evaluation.  Patient states that she would rather request Rx.  This helped in the past.  Patient awaits return call.        Reason for Disposition . [1] MODERATE difficulty breathing (e.g., speaks in phrases, SOB even at rest, pulse 100-120) AND [2] NEW-onset or WORSE than normal  Answer Assessment - Initial Assessment Questions 1. RESPIRATORY STATUS: "Describe your breathing?" (e.g., wheezing, shortness of breath, unable to speak, severe coughing)   SOB 2. ONSET: "When did this breathing problem begin?"     A week and half ago  3. PATTERN "Does the difficult breathing come and go, or has it been constant since it started?"    Constant.  4. SEVERITY: "How bad is your breathing?" (e.g., mild, moderate, severe)    - MILD: No SOB at rest, mild SOB with walking, speaks normally in sentences, can lay down, no retractions, pulse < 100.    - MODERATE: SOB at rest, SOB with minimal exertion and prefers to sit, cannot lie down flat, speaks in phrases, mild retractions, audible wheezing, pulse 100-120.    - SEVERE: Very SOB at rest, speaks in single words, struggling to breathe, sitting hunched forward, retractions, pulse > 120      moderate 5. RECURRENT SYMPTOM: "Have you had difficulty breathing before?" If so, ask: "When was the last time?" and "What happened that time?"      Yes prednisone 6. CARDIAC HISTORY: "Do you have any history of heart disease?" (e.g., heart attack, angina, bypass surgery, angioplasty)     none 7. LUNG HISTORY: "Do you have any history of lung disease?"  (e.g., pulmonary embolus, asthma, emphysema)     asthma 8. CAUSE: "What  do you think is causing the breathing problem?"      *No Answer* 9. OTHER SYMPTOMS: "Do you have any other symptoms? (e.g., dizziness, runny nose, cough, chest pain, fever)    denies 10. PREGNANCY: "Is there any chance you are pregnant?" "When was your last menstrual period?"       na 11. TRAVEL: "Have you traveled out of the country in the last month?" (e.g., travel history, exposures)       denies  Protocols used: BREATHING DIFFICULTY-A-AH

## 2019-09-20 NOTE — Telephone Encounter (Signed)
Patient advised.

## 2019-09-20 NOTE — Telephone Encounter (Signed)
Prednisone 12 day taper sent to [place]

## 2019-09-21 NOTE — Telephone Encounter (Signed)
Requested medication (s) are due for refill today: yes  Requested medication (s) are on the active medication list: yes  Last refill:  03/12/2018  Future visit scheduled: no  Notes to clinic:  Review for refill   Requested Prescriptions  Pending Prescriptions Disp Refills   albuterol (PROVENTIL) (2.5 MG/3ML) 0.083% nebulizer solution [Pharmacy Med Name: ALBUTEROL SUL 2.5 MG/3 ML SOLN] 75 mL 1    Sig: TAKE 3 MLS BY NEBULIZATION EVERY 6 HOURS AS NEEDED FOR WHEEZING OR SHORTNESS OF BREATH.     Pulmonology:  Beta Agonists Failed - 09/20/2019  6:07 PM      Failed - One inhaler should last at least one month. If the patient is requesting refills earlier, contact the patient to check for uncontrolled symptoms.      Passed - Valid encounter within last 12 months    Recent Outpatient Visits          3 months ago Exposure to [place], [place]ia [place], [place]   5 months ago Vaginal atrophy   [place], [place]   6 months ago Moderate persistent asthmatic bronchitis with acute exacerbation   [place], [place]   7 months ago Moderate persistent asthmatic bronchitis with acute exacerbation   [place], [place]   9 months ago Injury of coccyx, initial encounter   [facility] [place], Dionne Bucy, [place]              Requested Prescriptions  Pending Prescriptions Disp Refills   albuterol (PROVENTIL) (2.5 MG/3ML) 0.083% nebulizer solution [Pharmacy Med Name: ALBUTEROL SUL 2.5 MG/3 ML SOLN] 75 mL 1    Sig: TAKE 3 MLS BY NEBULIZATION EVERY 6 HOURS AS NEEDED FOR WHEEZING OR SHORTNESS OF BREATH.     Pulmonology:  Beta Agonists Failed - 09/20/2019  6:07 PM      Failed - One inhaler should last at least one month. If the patient is requesting refills earlier, contact the patient to check for uncontrolled symptoms.      Passed - Valid  encounter within last 12 months    Recent Outpatient Visits          3 months ago Exposure to [place], Maiza [place], [place]   5 months ago Vaginal atrophy   [place], [place]   6 months ago Moderate persistent asthmatic bronchitis with acute exacerbation   [place], [place]   7 months ago Moderate persistent asthmatic bronchitis with acute exacerbation   [facility] [place], [place], [place]   9 months ago Injury of coccyx, initial encounter   [facility], Dionne Bucy, [place]

## 2019-10-05 ENCOUNTER — Encounter: Payer: Self-pay | Admitting: Physician Assistant

## 2019-10-05 DIAGNOSIS — J4541 Moderate persistent asthma with (acute) exacerbation: Secondary | ICD-10-CM

## 2019-10-05 MED ORDER — PREDNISONE 10 MG PO TABS: ORAL_TABLET | ORAL | 0 refills | Status: DC

## 2019-10-30 ENCOUNTER — Other Ambulatory Visit: Payer: Self-pay | Admitting: Physician Assistant

## 2019-10-30 DIAGNOSIS — J309 Allergic rhinitis, unspecified: Secondary | ICD-10-CM

## 2019-11-25 ENCOUNTER — Other Ambulatory Visit: Payer: Self-pay | Admitting: Physician Assistant

## 2019-11-25 DIAGNOSIS — J4540 Moderate persistent asthma, uncomplicated: Secondary | ICD-10-CM

## 2019-12-30 ENCOUNTER — Telehealth: Payer: Self-pay

## 2019-12-30 NOTE — Telephone Encounter (Signed)
Tried to reach pt about questions she had in regards to forms she dropped off in the office. Need to know when forms were dropped off in our office and what the forms are for. Thanks TNP

## 2019-12-30 NOTE — Telephone Encounter (Signed)
Pt states that she got the form from the office. She states that it was for a medical record release. Please advise.

## 2019-12-31 NOTE — Telephone Encounter (Signed)
Release placed for [employer]. Thanks TNP

## 2020-01-03 ENCOUNTER — Encounter: Payer: Self-pay | Admitting: Physician Assistant

## 2020-01-03 DIAGNOSIS — R970 Elevated carcinoembryonic antigen [CEA]: Secondary | ICD-10-CM

## 2020-01-03 DIAGNOSIS — Z1239 Encounter for other screening for malignant neoplasm of breast: Secondary | ICD-10-CM

## 2020-01-03 DIAGNOSIS — Z1211 Encounter for screening for malignant neoplasm of colon: Secondary | ICD-10-CM

## 2020-01-03 NOTE — Addendum Note (Signed)
Addended by: Mar Daring on: 01/03/2020 02:18 PM   Modules accepted: Orders

## 2020-01-03 NOTE — Addendum Note (Signed)
Addended by: Mar Daring on: 01/03/2020 02:36 PM   Modules accepted: Orders

## 2020-01-06 ENCOUNTER — Ambulatory Visit: Payer: BLUE CROSS/BLUE SHIELD

## 2020-01-07 ENCOUNTER — Other Ambulatory Visit: Payer: Self-pay

## 2020-01-07 ENCOUNTER — Ambulatory Visit
Admission: RE | Admit: 2020-01-07 | Discharge: 2020-01-07 | Disposition: A | Discharge status: Home / Self Care | Payer: BC Managed Care – PPO | Source: Ambulatory Visit | Attending: Physician Assistant | Admitting: Physician Assistant

## 2020-01-07 ENCOUNTER — Telehealth: Payer: Self-pay

## 2020-01-07 DIAGNOSIS — R970 Elevated carcinoembryonic antigen [CEA]: Secondary | ICD-10-CM | POA: Diagnosis not present

## 2020-01-07 NOTE — Telephone Encounter (Signed)
-----   Message from Mar Daring, [place] sent at 01/07/2020  4:55 PM EDT ----- Pelvic and transvaginal US are essentially normal. The left ovary was not visualized unfortunately. Lets see what the other test come back as also and then we can also recheck the CEA then will go from there.

## 2020-01-07 NOTE — Telephone Encounter (Signed)
Patient advised as directed below. 

## 2020-01-10 DIAGNOSIS — Z1211 Encounter for screening for malignant neoplasm of colon: Secondary | ICD-10-CM | POA: Diagnosis not present

## 2020-01-19 ENCOUNTER — Other Ambulatory Visit: Payer: Self-pay | Admitting: Physician Assistant

## 2020-01-19 ENCOUNTER — Ambulatory Visit
Admission: RE | Admit: 2020-01-19 | Discharge: 2020-01-19 | Disposition: A | Discharge status: Home / Self Care | Payer: BC Managed Care – PPO | Source: Ambulatory Visit | Attending: Physician Assistant | Admitting: Physician Assistant

## 2020-01-19 DIAGNOSIS — Z1231 Encounter for screening mammogram for malignant neoplasm of breast: Secondary | ICD-10-CM | POA: Insufficient documentation

## 2020-01-19 DIAGNOSIS — Z1239 Encounter for other screening for malignant neoplasm of breast: Secondary | ICD-10-CM

## 2020-01-19 DIAGNOSIS — N6310 Unspecified lump in the right breast, unspecified quadrant: Secondary | ICD-10-CM

## 2020-01-19 DIAGNOSIS — R921 Mammographic calcification found on diagnostic imaging of breast: Secondary | ICD-10-CM | POA: Insufficient documentation

## 2020-01-19 DIAGNOSIS — R928 Other abnormal and inconclusive findings on diagnostic imaging of breast: Secondary | ICD-10-CM

## 2020-01-20 ENCOUNTER — Telehealth: Payer: Self-pay

## 2020-01-20 LAB — COLOGUARD
COLOGUARD: NEGATIVE
Cologuard: NEGATIVE

## 2020-01-20 NOTE — Telephone Encounter (Signed)
-----   Message from Mar Daring, [place] sent at 01/20/2020  4:48 PM EDT ----- Cologuard testing is negative.

## 2020-01-20 NOTE — Telephone Encounter (Signed)
Patient advised.

## 2020-02-03 ENCOUNTER — Ambulatory Visit
Admission: RE | Admit: 2020-02-03 | Discharge: 2020-02-03 | Disposition: A | Discharge status: Home / Self Care | Payer: BC Managed Care – PPO | Source: Ambulatory Visit | Attending: Physician Assistant | Admitting: Physician Assistant

## 2020-02-03 DIAGNOSIS — N6001 Solitary cyst of right breast: Secondary | ICD-10-CM | POA: Diagnosis not present

## 2020-02-03 DIAGNOSIS — R928 Other abnormal and inconclusive findings on diagnostic imaging of breast: Secondary | ICD-10-CM

## 2020-02-03 DIAGNOSIS — R921 Mammographic calcification found on diagnostic imaging of breast: Secondary | ICD-10-CM

## 2020-02-03 DIAGNOSIS — N6310 Unspecified lump in the right breast, unspecified quadrant: Secondary | ICD-10-CM

## 2020-02-04 ENCOUNTER — Telehealth: Payer: Self-pay

## 2020-02-04 NOTE — Telephone Encounter (Signed)
I do not think that would be of any benefit at this time as these are more often used for monitoring once a cancer diagnosis as been made. They are not always elevated even despite a cancer diagnosis so are not a good screening tool.

## 2020-02-04 NOTE — Telephone Encounter (Signed)
-----   Message from Mar Daring, [place] sent at 02/03/2020  6:43 PM EDT ----- Abnormalities in right breast noted to be most likely benign calcifications and complicated cysts. Repeat diagnostic mammogram and [place] in 6 months.

## 2020-02-04 NOTE — Telephone Encounter (Signed)
Patient advised as directed below. 

## 2020-02-04 NOTE — Telephone Encounter (Signed)
Patient advised as directed below. She is asking if you want to rerun the CEA blood test at this point or what do you think or suggest.

## 2020-02-10 ENCOUNTER — Encounter: Payer: Self-pay | Admitting: Physician Assistant

## 2020-02-10 DIAGNOSIS — K219 Gastro-esophageal reflux disease without esophagitis: Secondary | ICD-10-CM

## 2020-02-10 MED ORDER — PANTOPRAZOLE SODIUM 40 MG PO TBEC: 40.0000 mg | DELAYED_RELEASE_TABLET | Freq: Every day | ORAL | 3 refills | Status: DC

## 2020-02-28 ENCOUNTER — Encounter: Payer: Self-pay | Admitting: Physician Assistant

## 2020-02-28 DIAGNOSIS — J4541 Moderate persistent asthma with (acute) exacerbation: Secondary | ICD-10-CM

## 2020-02-28 MED ORDER — PREDNISONE 10 MG PO TABS: ORAL_TABLET | ORAL | 0 refills | Status: DC

## 2020-04-13 ENCOUNTER — Encounter: Payer: Self-pay | Admitting: Physician Assistant

## 2020-04-13 DIAGNOSIS — J4541 Moderate persistent asthma with (acute) exacerbation: Secondary | ICD-10-CM

## 2020-04-14 NOTE — Telephone Encounter (Signed)
Needs OV.  

## 2020-04-17 MED ORDER — PREDNISONE 10 MG PO TABS: ORAL_TABLET | ORAL | 0 refills | Status: DC

## 2020-04-17 NOTE — Addendum Note (Signed)
Addended by: Mar Daring on: 04/17/2020 09:14 AM   Modules accepted: Orders

## 2020-07-07 ENCOUNTER — Other Ambulatory Visit: Payer: Self-pay | Admitting: Physician Assistant

## 2020-07-07 DIAGNOSIS — N6310 Unspecified lump in the right breast, unspecified quadrant: Secondary | ICD-10-CM

## 2020-08-16 ENCOUNTER — Ambulatory Visit
Admission: RE | Admit: 2020-08-16 | Discharge: 2020-08-16 | Disposition: A | Discharge status: Home / Self Care | Payer: BC Managed Care – PPO | Source: Ambulatory Visit | Attending: Physician Assistant | Admitting: Physician Assistant

## 2020-08-16 ENCOUNTER — Other Ambulatory Visit: Payer: Self-pay

## 2020-08-16 DIAGNOSIS — R921 Mammographic calcification found on diagnostic imaging of breast: Secondary | ICD-10-CM | POA: Diagnosis not present

## 2020-08-16 DIAGNOSIS — N6310 Unspecified lump in the right breast, unspecified quadrant: Secondary | ICD-10-CM

## 2020-08-16 DIAGNOSIS — N6001 Solitary cyst of right breast: Secondary | ICD-10-CM | POA: Diagnosis not present

## 2020-09-03 ENCOUNTER — Other Ambulatory Visit: Payer: Self-pay | Admitting: Physician Assistant

## 2020-09-03 DIAGNOSIS — J4540 Moderate persistent asthma, uncomplicated: Secondary | ICD-10-CM

## 2020-09-03 NOTE — Telephone Encounter (Signed)
Requested medications are due for refill today?  Yes   Requested medications are on active medication list?  Yes  Last Refill:   11/25/2019  # 60 with one refill   Future visit scheduled?  No   Notes to Clinic:    Medication failed Rx refill protocol due to no valid encounter in the past 12 months.  Last office visit was 05/26/2019.

## 2020-11-02 ENCOUNTER — Other Ambulatory Visit: Payer: Self-pay | Admitting: Physician Assistant

## 2020-11-02 DIAGNOSIS — J4540 Moderate persistent asthma, uncomplicated: Secondary | ICD-10-CM

## 2020-11-17 ENCOUNTER — Encounter: Payer: Self-pay | Admitting: Physician Assistant

## 2020-11-17 ENCOUNTER — Other Ambulatory Visit: Payer: Self-pay

## 2020-11-17 ENCOUNTER — Ambulatory Visit: Payer: BC Managed Care – PPO | Admitting: Physician Assistant

## 2020-11-17 VITALS — BP 125/81 | HR 86 | Temp 98.1°F | Ht 69.75 in | Wt 157.0 lb

## 2020-11-17 DIAGNOSIS — L7211 Pilar cyst: Secondary | ICD-10-CM

## 2020-11-17 DIAGNOSIS — J45991 Cough variant asthma: Secondary | ICD-10-CM | POA: Diagnosis not present

## 2020-11-17 DIAGNOSIS — J309 Allergic rhinitis, unspecified: Secondary | ICD-10-CM

## 2020-11-17 DIAGNOSIS — J4540 Moderate persistent asthma, uncomplicated: Secondary | ICD-10-CM | POA: Diagnosis not present

## 2020-11-17 DIAGNOSIS — J4541 Moderate persistent asthma with (acute) exacerbation: Secondary | ICD-10-CM | POA: Diagnosis not present

## 2020-11-17 DIAGNOSIS — K219 Gastro-esophageal reflux disease without esophagitis: Secondary | ICD-10-CM

## 2020-11-17 DIAGNOSIS — J3089 Other allergic rhinitis: Secondary | ICD-10-CM | POA: Diagnosis not present

## 2020-11-17 MED ORDER — MONTELUKAST SODIUM 10 MG PO TABS: 10.0000 mg | ORAL_TABLET | Freq: Every day | ORAL | 3 refills | Status: DC

## 2020-11-17 MED ORDER — ALBUTEROL SULFATE HFA 108 (90 BASE) MCG/ACT IN AERS: 2.0000 | INHALATION_SPRAY | Freq: Four times a day (QID) | RESPIRATORY_TRACT | 5 refills | Status: DC | PRN

## 2020-11-17 MED ORDER — ALBUTEROL SULFATE (2.5 MG/3ML) 0.083% IN NEBU: INHALATION_SOLUTION | RESPIRATORY_TRACT | 1 refills | Status: AC

## 2020-11-17 MED ORDER — PREDNISONE 10 MG PO TABS: ORAL_TABLET | ORAL | 0 refills | Status: DC

## 2020-11-17 MED ORDER — PANTOPRAZOLE SODIUM 40 MG PO TBEC: 40.0000 mg | DELAYED_RELEASE_TABLET | Freq: Every day | ORAL | 3 refills | Status: DC

## 2020-11-17 MED ORDER — BREO ELLIPTA 200-25 MCG/INH IN AEPB: INHALATION_SPRAY | RESPIRATORY_TRACT | 5 refills | Status: DC

## 2020-11-17 NOTE — Progress Notes (Signed)
Established patient visit   Patient: Angela Reyes   DOB: 1961-07-20   60 y.o. Female  MRN: [id number] Visit Date: 11/17/2020  Today's healthcare provider: Mar Daring, PA-C   Chief Complaint  Patient presents with  . Sinusitis   Subjective    Sinusitis This is a chronic problem. The current episode started more than 1 month ago. The problem is unchanged. There has been no fever. Associated symptoms include congestion, coughing and sinus pressure. Pertinent negatives include no ear pain, headaches, shortness of breath, sneezing or sore throat. Past treatments include oral decongestants. The treatment provided mild relief.    Pt also noticed a lump on her head. Noticed a few weeks ago. Reports it does not hurt. Has been growing some since she noticed. No bleeding. Occasionally itches. No hair loss.   Patient Active Problem List   Diagnosis Date Noted  . Allergic rhinitis 05/01/2015  . Absolute anemia 03/06/2015  . At risk for osteoporosis 03/06/2015  . Long term current use of systemic steroids 03/06/2015  . Acid reflux 03/06/2015  . External hemorrhoid 03/06/2015  . Plantar fasciitis 03/06/2015  . Severe persistent asthma 01/27/2014  . Lung nodule 10/14/2013  . Chronic cough 09/12/2013  . Asthma, cough variant 06/30/2009  . Fatty tumor 12/08/2008  . Adiposity 12/06/2008  . Anxiety 11/17/2008  . Adaptive colitis 11/17/2008  . LBP (low back pain) 11/17/2008   Past Medical History:  Diagnosis Date  . Asthma   . Chronic rhinosinusitis   . GERD (gastroesophageal reflux disease)   . History of gestational diabetes   . History of hiatal hernia   . OSA (obstructive sleep apnea)    non-compliant cpap  . Pulmonary nodule, right    middle lobe-- stable low risk--  pulmologist,  dr Chase Caller  . Right ureteral stone    Social History   Tobacco Use  . Smoking status: Never Smoker  . Smokeless tobacco: Never Used  Substance Use Topics  . Alcohol use: No  .  Drug use: No   No Known Allergies   Medications: Outpatient Medications Prior to Visit  Medication Sig  . albuterol (PROAIR HFA) 108 (90 Base) MCG/ACT inhaler Inhale 2 puffs into the lungs every 6 (six) hours as needed for wheezing or shortness of breath.  Marland Kitchen albuterol (PROVENTIL) (2.5 MG/3ML) 0.083% nebulizer solution TAKE 3 MLS BY NEBULIZATION EVERY 6 HOURS AS NEEDED FOR WHEEZING OR SHORTNESS OF BREATH.  Marland Kitchen BREO ELLIPTA 200-25 MCG/INH AEPB INHALE 1 PUFF INTO THE LUNGS EVERY EVENING.  . cetirizine (ZYRTEC) 10 MG tablet Take 10 mg by mouth at bedtime. Reported on 05/01/2016  . conjugated estrogens (PREMARIN) vaginal cream Place 1 Applicatorful vaginally daily.  . Fluticasone Propionate 93 MCG/ACT EXHU Place into the nose.  . montelukast (SINGULAIR) 10 MG tablet TAKE 1 TABLET (10 MG TOTAL) BY MOUTH AT BEDTIME.  . pantoprazole (PROTONIX) 40 MG tablet Take 1 tablet (40 mg total) by mouth daily.  Marland Kitchen azithromycin (ZITHROMAX) 250 MG tablet Take 2 tablets PO on day one, and one tablet PO daily thereafter until completed.  . Chlorpheniramine Maleate (ALLERGY PO) Take by mouth daily. Drops  . fluconazole (DIFLUCAN) 150 MG tablet Take 1 tablet (150 mg total) by mouth daily.  . meloxicam (MOBIC) 15 MG tablet TAKE 1 TABLET BY MOUTH EVERY DAY  . predniSONE (DELTASONE) 10 MG tablet Take 6 tabs PO on day 1&2, 5 tabs PO on day 3&4, 4 tabs PO on day 5&6, 3 tabs PO  on day 7&8, 2 tabs PO on day 9&10, 1 tab PO on day 11&12.   No facility-administered medications prior to visit.    Review of Systems  Constitutional: Negative.   HENT: Positive for congestion, postnasal drip, rhinorrhea, sinus pressure and sinus pain. Negative for ear discharge, ear pain, hearing loss, sneezing, sore throat, tinnitus, trouble swallowing and voice change.   Eyes: Positive for redness. Negative for photophobia, pain, discharge, itching and visual disturbance.  Respiratory: Positive for cough. Negative for apnea, choking, chest  tightness, shortness of breath, wheezing and stridor.   Cardiovascular: Negative.   Gastrointestinal: Negative.   Neurological: Negative for dizziness, light-headedness and headaches.    @AMBLABREVIEWLINK @  Objective    BP 125/81 (BP Location: Left Arm, Patient Position: Sitting, Cuff Size: Large)   Pulse 86   Temp 98.1 F (36.7 C) (Oral)   Ht 5' 9.75" (1.772 m)   Wt 157 lb (71.2 kg)   LMP 02/10/2015 Comment: 1st one since dec 2014  BMI 22.69 kg/m    Physical Exam Vitals reviewed.  Constitutional:      General: She is not in acute distress.    Appearance: Normal appearance. She is well-developed and well-nourished. She is not ill-appearing or diaphoretic.  HENT:     Head: Normocephalic and atraumatic.     Right Ear: Hearing, tympanic membrane, ear canal and external ear normal.     Left Ear: Hearing, tympanic membrane, ear canal and external ear normal.     Nose: Congestion present.     Right Sinus: Maxillary sinus tenderness and frontal sinus tenderness present.     Left Sinus: Maxillary sinus tenderness and frontal sinus tenderness present.     Mouth/Throat:     Mouth: Oropharynx is clear and moist and mucous membranes are normal.  Eyes:     Extraocular Movements: Extraocular movements intact.     Pupils: Pupils are equal, round, and reactive to light.  Neck:     Thyroid: No thyromegaly.     Trachea: No tracheal deviation.  Cardiovascular:     Rate and Rhythm: Normal rate and regular rhythm.     Heart sounds: Normal heart sounds. No murmur heard. No friction rub. No gallop.   Pulmonary:     Effort: Pulmonary effort is normal. No respiratory distress.     Breath sounds: Normal breath sounds. No stridor. No wheezing or rales.  Musculoskeletal:     Cervical back: Normal range of motion and neck supple.  Lymphadenopathy:     Cervical: No cervical adenopathy.  Neurological:     Mental Status: She is alert.     No results found for any visits on 11/17/20.   Assessment & Plan     1. Moderate persistent asthmatic bronchitis with acute exacerbation Worsening asthmatic bronchitis exacerbation. Prednisone provided as below.  - predniSONE (DELTASONE) 10 MG tablet; Take 6 tabs PO on day 1&2, 5 tabs PO on day 3&4, 4 tabs PO on day 5&6, 3 tabs PO on day 7&8, 2 tabs PO on day 9&10, 1 tab PO on day 11&12.  Dispense: 42 tablet; Refill: 0  2. Asthma, cough variant Stable. Diagnosis pulled for medication refill. Continue current medical treatment plan. - albuterol (PROAIR HFA) 108 (90 Base) MCG/ACT inhaler; Inhale 2 puffs into the lungs every 6 (six) hours as needed for wheezing or shortness of breath.  Dispense: 1 each; Refill: 5  3. Moderate persistent asthma without complication Stable. Diagnosis pulled for medication refill. Continue current medical treatment plan. -  albuterol (PROVENTIL) (2.5 MG/3ML) 0.083% nebulizer solution; TAKE 3 MLS BY NEBULIZATION EVERY 6 HOURS AS NEEDED FOR WHEEZING OR SHORTNESS OF BREATH.  Dispense: 75 mL; Refill: 1 - fluticasone furoate-vilanterol (BREO ELLIPTA) 200-25 MCG/INH AEPB; INHALE 1 PUFF INTO THE LUNGS EVERY EVENING.  Dispense: 60 each; Refill: 5  4. Allergic rhinitis Stable. Diagnosis pulled for medication refill. Continue current medical treatment plan. - montelukast (SINGULAIR) 10 MG tablet; Take 1 tablet (10 mg total) by mouth at bedtime.  Dispense: 90 tablet; Refill: 3  5. Gastroesophageal reflux disease without esophagitis Stable. Diagnosis pulled for medication refill. Continue current medical treatment plan. - pantoprazole (PROTONIX) 40 MG tablet; Take 1 tablet (40 mg total) by mouth daily.  Dispense: 90 tablet; Refill: 3  6. Pilar cyst Noted on scalp. Possible pilar vs dermoid cyst. Suspect pilar as it is growing up. Patient is established with Dr. Cammie Sickle. Referral placed back to him for acute issue.  - Ambulatory referral to Dermatology   No follow-ups on file.      Reynolds Bowl, PA-C, have  reviewed all documentation for this visit. The documentation on 11/19/20 for the exam, diagnosis, procedures, and orders are all accurate and complete.   Rubye Beach  [facility] [phone] (phone) [phone] (fax)  [place]

## 2020-11-19 ENCOUNTER — Encounter: Payer: Self-pay | Admitting: Physician Assistant

## 2020-11-19 MED ORDER — AZITHROMYCIN 250 MG PO TABS: ORAL_TABLET | ORAL | 0 refills | Status: DC

## 2020-11-20 ENCOUNTER — Telehealth: Payer: Self-pay

## 2020-11-20 DIAGNOSIS — J4541 Moderate persistent asthma with (acute) exacerbation: Secondary | ICD-10-CM

## 2020-11-20 MED ORDER — AZITHROMYCIN 250 MG PO TABS: ORAL_TABLET | ORAL | 0 refills | Status: DC

## 2020-11-20 NOTE — Telephone Encounter (Signed)
Zpak was sent in but I have resent it. Sometimes the e-prescribe does not go through

## 2020-11-20 NOTE — Addendum Note (Signed)
Addended by: Mar Daring on: 11/20/2020 05:33 PM   Modules accepted: Orders

## 2020-11-20 NOTE — Telephone Encounter (Signed)
Copied from [place] [id number]. Topic: General - Inquiry >> Nov 17, 2020  4:57 PM Greggory Keen D wrote: Reason for CRM: Pt called saying she was in the office for a visit today and thought Tawanna Sat was going to send in the zpak a long with prednisone .  She got the Prednisone but no antibiotic.  She uses [facility]  CB#  [phone]

## 2020-11-20 NOTE — Telephone Encounter (Signed)
Please advise 

## 2021-01-15 ENCOUNTER — Encounter: Payer: Self-pay | Admitting: Physician Assistant

## 2021-01-15 DIAGNOSIS — R928 Other abnormal and inconclusive findings on diagnostic imaging of breast: Secondary | ICD-10-CM

## 2021-01-15 DIAGNOSIS — Z1239 Encounter for other screening for malignant neoplasm of breast: Secondary | ICD-10-CM

## 2021-01-22 ENCOUNTER — Telehealth: Payer: Self-pay

## 2021-01-22 DIAGNOSIS — Z1239 Encounter for other screening for malignant neoplasm of breast: Secondary | ICD-10-CM

## 2021-01-22 DIAGNOSIS — R928 Other abnormal and inconclusive findings on diagnostic imaging of breast: Secondary | ICD-10-CM

## 2021-01-22 NOTE — Telephone Encounter (Signed)
[place] and diagnostic mammo ordered

## 2021-01-23 NOTE — Addendum Note (Signed)
Addended by: Mar Daring on: 01/23/2021 04:37 PM   Modules accepted: Orders

## 2021-01-29 ENCOUNTER — Telehealth: Payer: Self-pay | Admitting: Physician Assistant

## 2021-01-29 NOTE — Telephone Encounter (Signed)
Per [facility] they will need an order for diagnostic bilateral mammogram. It is time for her yearly, [id number].Thanks

## 2021-01-30 ENCOUNTER — Other Ambulatory Visit: Payer: Self-pay | Admitting: Physician Assistant

## 2021-01-30 DIAGNOSIS — R928 Other abnormal and inconclusive findings on diagnostic imaging of breast: Secondary | ICD-10-CM

## 2021-01-30 DIAGNOSIS — N6001 Solitary cyst of right breast: Secondary | ICD-10-CM

## 2021-02-12 ENCOUNTER — Ambulatory Visit
Admission: RE | Admit: 2021-02-12 | Discharge: 2021-02-12 | Disposition: A | Discharge status: Home / Self Care | Payer: BC Managed Care – PPO | Source: Ambulatory Visit | Attending: Physician Assistant | Admitting: Physician Assistant

## 2021-02-12 ENCOUNTER — Other Ambulatory Visit: Payer: Self-pay

## 2021-02-12 DIAGNOSIS — R921 Mammographic calcification found on diagnostic imaging of breast: Secondary | ICD-10-CM | POA: Diagnosis not present

## 2021-02-12 DIAGNOSIS — R928 Other abnormal and inconclusive findings on diagnostic imaging of breast: Secondary | ICD-10-CM | POA: Diagnosis not present

## 2021-02-12 DIAGNOSIS — N6001 Solitary cyst of right breast: Secondary | ICD-10-CM | POA: Diagnosis not present

## 2021-02-12 DIAGNOSIS — R922 Inconclusive mammogram: Secondary | ICD-10-CM | POA: Diagnosis not present

## 2021-02-12 NOTE — Progress Notes (Signed)
  Tawanna Sat PA former patient will need to schedule with a new provider at [facility] since Tawanna Sat is gone.  RECOMMENDATION: 1. Diagnostic mammography and right breast ultrasound in 1 year to provide 2 years of stability for the probably benign findings.  I have discussed the findings and recommendations with the patient. If applicable, a reminder letter will be sent to the patient regarding the next appointment.  BI-RADS CATEGORY  3: Probably benign.   Electronically Signed   By: Lajean Manes M.D.   On: 02/12/2021 10:14  Signed By:  Lajean Manes, MD on 02/12/2021 10:14 AM

## 2021-02-19 ENCOUNTER — Ambulatory Visit: Payer: BC Managed Care – PPO | Admitting: Dermatology

## 2021-04-30 ENCOUNTER — Other Ambulatory Visit: Payer: Self-pay

## 2021-04-30 ENCOUNTER — Ambulatory Visit (INDEPENDENT_AMBULATORY_CARE_PROVIDER_SITE_OTHER): Payer: BC Managed Care – PPO | Admitting: Dermatology

## 2021-04-30 ENCOUNTER — Other Ambulatory Visit: Payer: Self-pay | Admitting: Dermatology

## 2021-04-30 DIAGNOSIS — L578 Other skin changes due to chronic exposure to nonionizing radiation: Secondary | ICD-10-CM | POA: Diagnosis not present

## 2021-04-30 DIAGNOSIS — L821 Other seborrheic keratosis: Secondary | ICD-10-CM | POA: Diagnosis not present

## 2021-04-30 DIAGNOSIS — L980 Pyogenic granuloma: Secondary | ICD-10-CM

## 2021-04-30 DIAGNOSIS — D489 Neoplasm of uncertain behavior, unspecified: Secondary | ICD-10-CM

## 2021-04-30 NOTE — Patient Instructions (Signed)
Electrodesiccation and Curettage ("Scrape and Burn") Wound Care Instructions  Leave the original bandage on for 24 hours if possible.  If the bandage becomes soaked or soiled before that time, it is OK to remove it and examine the wound.  A small amount of post-operative bleeding is normal.  If excessive bleeding occurs, remove the bandage, place gauze over the site and apply continuous pressure (no peeking) over the area for 30 minutes. If this does not work, please call our clinic as soon as possible or page your doctor if it is after hours.   Once a day, cleanse the wound with soap and water. It is fine to shower. If a thick crust develops you may use a Q-tip dipped into dilute hydrogen peroxide (mix 1:1 with water) to dissolve it.  Hydrogen peroxide can slow the healing process, so use it only as needed.    After washing, apply petroleum jelly (Vaseline) or an antibiotic ointment if your doctor prescribed one for you, followed by a bandage.    For best healing, the wound should be covered with a layer of ointment at all times. If you are not able to keep the area covered with a bandage to hold the ointment in place, this may mean re-applying the ointment several times a day.  Continue this wound care until the wound has healed and is no longer open. It may take several weeks for the wound to heal and close.  Itching and mild discomfort is normal during the healing process.  If you have any discomfort, you can take Tylenol (acetaminophen) or ibuprofen as directed on the bottle. (Please do not take these if you have an allergy to them or cannot take them for another reason).  Some redness, tenderness and white or yellow material in the wound is normal healing.  If the area becomes very sore and red, or develops a thick yellow-green material (pus), it may be infected; please notify us.    Wound healing continues for up to one year following surgery. It is not unusual to experience pain in the scar  from time to time during the interval.  If the pain becomes severe or the scar thickens, you should notify the office.    A slight amount of redness in a scar is expected for the first six months.  After six months, the redness will fade and the scar will soften and fade.  The color difference becomes less noticeable with time.  If there are any problems, return for a post-op surgery check at your earliest convenience.  To improve the appearance of the scar, you can use silicone scar gel, cream, or sheets (such as Mederma or Serica) every night for up to one year. These are available over the counter (without a prescription).  Please call our office at [phone] for any questions or concerns.  Biopsy Wound Care Instructions  Leave the original bandage on for 24 hours if possible.  If the bandage becomes soaked or soiled before that time, it is OK to remove it and examine the wound.  A small amount of post-operative bleeding is normal.  If excessive bleeding occurs, remove the bandage, place gauze over the site and apply continuous pressure (no peeking) over the area for 30 minutes. If this does not work, please call our clinic as soon as possible or page your doctor if it is after hours.   Once a day, cleanse the wound with soap and water. It is fine to shower. If   a thick crust develops you may use a Q-tip dipped into dilute hydrogen peroxide (mix 1:1 with water) to dissolve it.  Hydrogen peroxide can slow the healing process, so use it only as needed.    After washing, apply petroleum jelly (Vaseline) or an antibiotic ointment if your doctor prescribed one for you, followed by a bandage.    For best healing, the wound should be covered with a layer of ointment at all times. If you are not able to keep the area covered with a bandage to hold the ointment in place, this may mean re-applying the ointment several times a day.  Continue this wound care until the wound has healed and is no longer  open.   Itching and mild discomfort is normal during the healing process. However, if you develop pain or severe itching, please call our office.   If you have any discomfort, you can take Tylenol (acetaminophen) or ibuprofen as directed on the bottle. (Please do not take these if you have an allergy to them or cannot take them for another reason).  Some redness, tenderness and white or yellow material in the wound is normal healing.  If the area becomes very sore and red, or develops a thick yellow-green material (pus), it may be infected; please notify us.    If you have stitches, return to clinic as directed to have the stitches removed. You will continue wound care for 2-3 days after the stitches are removed.   Wound healing continues for up to one year following surgery. It is not unusual to experience pain in the scar from time to time during the interval.  If the pain becomes severe or the scar thickens, you should notify the office.    A slight amount of redness in a scar is expected for the first six months.  After six months, the redness will fade and the scar will soften and fade.  The color difference becomes less noticeable with time.  If there are any problems, return for a post-op surgery check at your earliest convenience.  To improve the appearance of the scar, you can use silicone scar gel, cream, or sheets (such as Mederma or Serica) every night for up to one year. These are available over the counter (without a prescription).  Please call our office at [phone] for any questions or concerns.  If you have any questions or concerns for your doctor, please call our main line at [phone] and press option 4 to reach your doctor's medical assistant. If no one answers, please leave a voicemail as directed and we will return your call as soon as possible. Messages left after 4 pm will be answered the following business day.   You may also send [place] a message via [facility] [place]. We  typically respond to MyChart messages within 1-2 business days.  For prescription refills, please ask your pharmacy to contact our office. Our fax number is [phone].  If you have an urgent issue when the clinic is closed that cannot wait until the next business day, you can page your doctor at the number below.    Please note that while we do our best to be available for urgent issues outside of office hours, we are not available 24/7.   If you have an urgent issue and are unable to reach [place], you may choose to seek medical care at your doctor's office, retail clinic, urgent care center, or emergency room.  If you have a medical emergency, please  immediately call 911 or go to the emergency department.  Pager Numbers  - Dr. Nehemiah Massed: [phone]  - Dr. Laurence Ferrari: [phone]  - Dr. Nicole Kindred: [phone]  In the event of inclement weather, please call our main line at [phone] for an update on the status of any delays or closures.  Dermatology Medication Tips: Please keep the boxes that topical medications come in in order to help keep track of the instructions about where and how to use these. Pharmacies typically print the medication instructions only on the boxes and not directly on the medication tubes.   If your medication is too expensive, please contact our office at [phone] option 4 or send [place] a message through [place].   We are unable to tell what your co-pay for medications will be in advance as this is different depending on your insurance coverage. However, we may be able to find a substitute medication at lower cost or fill out paperwork to get insurance to cover a needed medication.   If a prior authorization is required to get your medication covered by your insurance company, please allow [place] 1-2 business days to complete this process.  Drug prices often vary depending on where the prescription is filled and some pharmacies may offer cheaper prices.  The  website www.goodrx.com contains coupons for medications through different pharmacies. The prices here do not account for what the cost may be with help from insurance (it may be cheaper with your insurance), but the website can give you the price if you did not use any insurance.  - You can print the associated coupon and take it with your prescription to the pharmacy.  - You may also stop by our office during regular business hours and pick up a GoodRx coupon card.  - If you need your prescription sent electronically to a different pharmacy, notify our office through [facility] or by phone at [phone] option 4.

## 2021-04-30 NOTE — Progress Notes (Signed)
   Follow-Up Visit   Subjective  Angela Reyes is a 60 y.o. female who presents for the following: Follow-up (Patient here today concerning open wound on right upper arm. Patient report that a month ago she scratched a small bump off and it bled. She has since covered it with band aid and it is not healing properly. Patient reports a history of precancers frozen off back. ).    The following portions of the chart were reviewed this encounter and updated as appropriate:       Objective  Well appearing patient in no apparent distress; mood and affect are within normal limits.  A focused examination was performed including right upper arm. Relevant physical exam findings are noted in the Assessment and Plan.  right upper arm 1 cm red bleeding nodule      Assessment & Plan  Neoplasm of uncertain behavior right upper arm  Epidermal / dermal shaving  Lesion diameter (cm):  1.2 Informed consent: discussed and consent obtained   Timeout: patient name, date of birth, surgical site, and procedure verified   Patient was prepped and draped in usual sterile fashion: area prepped with alcohol. Anesthesia: the lesion was anesthetized in a standard fashion   Anesthetic:  1% lidocaine w/ epinephrine 1-100,000 local infiltration Instrument used: flexible razor blade   Hemostasis achieved with: pressure, aluminum chloride and electrodesiccation   Outcome: patient tolerated procedure well   Post-procedure details: wound care instructions given   Post-procedure details comment:  Ointment and a small bandage applied Additional details:  Curettage performed at base of lesion  Specimen 1 - Surgical pathology Differential Diagnosis: R/o pyogenic granuloma vs other   Check Margins: No 1 cm red bleeding nodule   R/o pyogenic granuloma vs other    Seborrheic Keratoses - Stuck-on, waxy, tan-brown papule on left nasal tip, discussed cryotherapy-Discussed cosmetic procedure, noncovered.  $60  for 1st lesion and $15 for each additional lesion if done on the same day.  Maximum charge $350.  One touch-up treatment included no charge. Discussed risks of treatment including dyspigmentation, small scar, and/or recurrence. Recommend daily broad spectrum sunscreen SPF 30+/photoprotection to treated areas once healed.  - Benign-appearing - Discussed benign etiology and prognosis. - Observe - Call for any changes  Actinic Damage - chronic, secondary to cumulative UV radiation exposure/sun exposure over time - diffuse scaly erythematous macules with underlying dyspigmentation - Recommend daily broad spectrum sunscreen SPF 30+ to sun-exposed areas, reapply every 2 hours as needed.  - Recommend staying in the shade or wearing long sleeves, sun glasses (UVA+UVB protection) and wide brim hats (4-inch brim around the entire circumference of the hat). - Call for new or changing lesions.  Return if symptoms worsen or fail to improve. I, Ruthell Rummage, CMA, am acting as scribe for Brendolyn Patty, MD.  Documentation: I have reviewed the above documentation for accuracy and completeness, and I agree with the above.  Brendolyn Patty MD

## 2021-05-09 ENCOUNTER — Telehealth: Payer: Self-pay

## 2021-05-09 NOTE — Telephone Encounter (Signed)
Patient advised bx benign, may recur.Angela Reyes

## 2021-05-14 ENCOUNTER — Other Ambulatory Visit: Payer: Self-pay

## 2021-05-14 ENCOUNTER — Emergency Department: Payer: BC Managed Care – PPO

## 2021-05-14 ENCOUNTER — Ambulatory Visit: Payer: Self-pay

## 2021-05-14 ENCOUNTER — Emergency Department
Admission: EM | Admit: 2021-05-14 | Discharge: 2021-05-14 | Disposition: A | Discharge status: Home / Self Care | Payer: BC Managed Care – PPO | Attending: Emergency Medicine | Admitting: Emergency Medicine

## 2021-05-14 DIAGNOSIS — R072 Precordial pain: Secondary | ICD-10-CM | POA: Insufficient documentation

## 2021-05-14 DIAGNOSIS — J4550 Severe persistent asthma, uncomplicated: Secondary | ICD-10-CM | POA: Diagnosis not present

## 2021-05-14 DIAGNOSIS — R079 Chest pain, unspecified: Secondary | ICD-10-CM

## 2021-05-14 DIAGNOSIS — M25512 Pain in left shoulder: Secondary | ICD-10-CM | POA: Insufficient documentation

## 2021-05-14 LAB — BASIC METABOLIC PANEL
Anion gap: 9 (ref 5–15)
BUN: 14 mg/dL (ref 6–20)
CO2: 25 mmol/L (ref 22–32)
Calcium: 9 mg/dL (ref 8.9–10.3)
Chloride: 105 mmol/L (ref 98–111)
Creatinine, Ser: 0.99 mg/dL (ref 0.44–1.00)
GFR, Estimated: >60 mL/min (ref >60)
Glucose, Bld: 95 mg/dL (ref 70–99)
Potassium: 4.2 mmol/L (ref 3.5–5.1)
Sodium: 139 mmol/L (ref 135–145)

## 2021-05-14 LAB — TROPONIN I (HIGH SENSITIVITY): Troponin I (High Sensitivity): 3 ng/L (ref <18)

## 2021-05-14 LAB — CBC
HCT: 43.4 % (ref 36.0–46.0)
Hemoglobin: 14.5 g/dL (ref 12.0–15.0)
MCH: 30.4 pg (ref 26.0–34.0)
MCHC: 33.4 g/dL (ref 30.0–36.0)
MCV: 91 fL (ref 80.0–100.0)
Platelets: 269 10*3/uL (ref 150–400)
RBC: 4.77 MIL/uL (ref 3.87–5.11)
RDW: 13.2 % (ref 11.5–15.5)
WBC: 7.4 10*3/uL (ref 4.0–10.5)
nRBC: 0 % (ref 0.0–0.2)

## 2021-05-14 NOTE — Telephone Encounter (Signed)
Pt. Reports she has had chest pain x 1 week that comes and goes. Has back pain as well, "not sure it's related." Had chest pain this morning "and it's better, but I feel funny in my chest." Instructed to have someone take her to ED now. Verbalizes understanding.

## 2021-05-14 NOTE — ED Provider Notes (Signed)
[facility] Emergency Department Provider Note   ____________________________________________   Event Date/Time   First MD Initiated Contact with Patient 05/14/21 1121     (approximate)  I have reviewed the triage vital signs and the nursing notes.   HISTORY  Chief Complaint Chest Pain    HPI Angela Reyes is a 60 y.o. female with below stated past medical history including significant history of GERD off medications who presents for chest pain  LOCATION: Lower substernal region DURATION: 1 week TIMING: Intermittent SEVERITY: 2/10 QUALITY: Heaviness/burning CONTEXT: Patient states she has noticed this pain intermittently over the past week MODIFYING FACTORS: Denies any specific exacerbating or relieving factors however she has noted over the last 2 times, symptoms have started occurring when she is laid flat ASSOCIATED SYMPTOMS: Left shoulder pain   Per medical record review, patient has history of GERD and has been off of her antireflux medications due to insurance complications          Past Medical History:  Diagnosis Date   Asthma    Chronic rhinosinusitis    GERD (gastroesophageal reflux disease)    History of dysplastic nevus 04/26/2010   left mid to upper back, mod to severe/excised 06/13/10   History of dysplastic nevus 01/07/2018   right mid medial dorsum scapula, severe   History of gestational diabetes    History of hiatal hernia    OSA (obstructive sleep apnea)    non-compliant cpap   Pulmonary nodule, right    middle lobe-- stable low risk--  pulmologist,  dr Chase Caller   Right ureteral stone     Patient Active Problem List   Diagnosis Date Noted   Allergic rhinitis 05/01/2015   Absolute anemia 03/06/2015   At risk for osteoporosis 03/06/2015   Long term current use of systemic steroids 03/06/2015   Acid reflux 03/06/2015   External hemorrhoid 03/06/2015   Plantar fasciitis 03/06/2015   Severe persistent asthma  01/27/2014   Lung nodule 10/14/2013   Chronic cough 09/12/2013   Asthma, cough variant 06/30/2009   Fatty tumor 12/08/2008   Adiposity 12/06/2008   Anxiety 11/17/2008   Adaptive colitis 11/17/2008   LBP (low back pain) 11/17/2008    Past Surgical History:  Procedure Laterality Date   CYSTOSCOPY W/ URETERAL STENT PLACEMENT Right 06/07/2015   Procedure: CYSTOSCOPY WITH RETROGRADE PYELOGRAM/ STONE EXTRACTION AND URETERAL STENT PLACEMENT;  Surgeon: Cleon Gustin, MD;  Location: [facility];  Service: Urology;  Laterality: Right;   EXTRACORPOREAL SHOCK WAVE LITHOTRIPSY Right 05-15-2015   HOLMIUM LASER APPLICATION Right [id number]   Procedure: HOLMIUM LASER APPLICATION;  Surgeon: Cleon Gustin, MD;  Location: [facility];  Service: Urology;  Laterality: Right;   TONSILLECTOMY  age 45   WISDOM TOOTH EXTRACTION  age 59    Prior to Admission medications   Medication Sig Start Date End Date Taking? Authorizing Provider  albuterol (PROAIR HFA) 108 (90 Base) MCG/ACT inhaler Inhale 2 puffs into the lungs every 6 (six) hours as needed for wheezing or shortness of breath. 11/17/20   Mar Daring, PA-C  albuterol (PROVENTIL) (2.5 MG/3ML) 0.083% nebulizer solution TAKE 3 MLS BY NEBULIZATION EVERY 6 HOURS AS NEEDED FOR WHEEZING OR SHORTNESS OF BREATH. 11/17/20   Mar Daring, PA-C  azithromycin (ZITHROMAX) 250 MG tablet Take 2 tablets PO on day one, and one tablet PO daily thereafter until completed. 11/20/20   Mar Daring, PA-C  cetirizine (ZYRTEC) 10 MG tablet Take 10  mg by mouth at bedtime. Reported on 05/01/2016    [provider]  conjugated estrogens (PREMARIN) vaginal cream Place 1 Applicatorful vaginally daily. 04/20/19   Mar Daring, PA-C  fluticasone furoate-vilanterol (BREO ELLIPTA) 200-25 MCG/INH AEPB INHALE 1 PUFF INTO THE LUNGS EVERY EVENING. 11/17/20   Mar Daring, PA-C  Fluticasone Propionate 93 MCG/ACT  EXHU Place into the nose.    [provider]  montelukast (SINGULAIR) 10 MG tablet Take 1 tablet (10 mg total) by mouth at bedtime. 11/17/20   Mar Daring, PA-C  pantoprazole (PROTONIX) 40 MG tablet Take 1 tablet (40 mg total) by mouth daily. 11/17/20   Mar Daring, PA-C  predniSONE (DELTASONE) 10 MG tablet Take 6 tabs PO on day 1&2, 5 tabs PO on day 3&4, 4 tabs PO on day 5&6, 3 tabs PO on day 7&8, 2 tabs PO on day 9&10, 1 tab PO on day 11&12. 11/17/20   Mar Daring, PA-C    Allergies Patient has no known allergies.  Family History  Problem Relation Age of Onset   Hypertension Mother    Hypertension Father    Kidney disease Brother    Hypertension Maternal Grandmother    Cancer Paternal Grandfather    Allergies Other        siblings   Breast cancer Neg Hx     Social History Social History   Tobacco Use   Smoking status: Never   Smokeless tobacco: Never  Substance Use Topics   Alcohol use: No   Drug use: No    Review of Systems Constitutional: No fever/chills Eyes: No visual changes. ENT: No sore throat. Cardiovascular: Endorses chest pain. Respiratory: Denies shortness of breath. Gastrointestinal: Endorses midepigastric abdominal pain.  No nausea, no vomiting.  No diarrhea. Genitourinary: Negative for dysuria. Musculoskeletal: Negative for acute arthralgias Skin: Negative for rash. Neurological: Negative for headaches, weakness/numbness/paresthesias in any extremity Psychiatric: Negative for suicidal ideation/homicidal ideation   ____________________________________________   PHYSICAL EXAM:  VITAL SIGNS: ED Triage Vitals [05/14/21 0837]  Enc Vitals Group     BP (!) 157/83     Pulse Rate 68     Resp 15     Temp 97.8 F (36.6 C)     Temp Source Oral     SpO2 100 %     Weight 160 lb (72.6 kg)     Height '5\' 9"'$  (1.753 m)     Head Circumference      Peak Flow      Pain Score 2     Pain Loc      Pain Edu?      Excl. in [place]?     Constitutional: Alert and oriented. Well appearing and in no acute distress. Eyes: Conjunctivae are normal. PERRL. Head: Atraumatic. Nose: No congestion/rhinnorhea. Mouth/Throat: Mucous membranes are moist. Neck: No stridor Cardiovascular: Grossly normal heart sounds.  Good peripheral circulation. Respiratory: Normal respiratory effort.  No retractions. Gastrointestinal: Soft and nontender. No distention. Musculoskeletal: No obvious deformities Neurologic:  Normal speech and language. No gross focal neurologic deficits are appreciated. Skin:  Skin is warm and dry. No rash noted. Psychiatric: Mood and affect are normal. Speech and behavior are normal.  ____________________________________________   LABS (all labs ordered are listed, but only abnormal results are displayed)  Labs Reviewed  BASIC METABOLIC PANEL  CBC  POC URINE PREG, ED  TROPONIN I (HIGH SENSITIVITY)  TROPONIN I (HIGH SENSITIVITY)   ____________________________________________  EKG  ED ECG REPORT I, [place] Lawman  Quashawn Jewkes, the attending physician, personally viewed and interpreted this ECG.  Date: 05/14/2021 EKG Time: 0834 Rate: 66 Rhythm: normal sinus rhythm QRS Axis: normal Intervals: normal ST/T Wave abnormalities: normal Narrative Interpretation: no evidence of acute ischemia  ____________________________________________  RADIOLOGY  ED MD interpretation: 2 view chest x-ray shows no evidence of acute abnormalities including no pneumonia, pneumothorax, or widened mediastinum  Official radiology report(s): DG Chest 2 View  Result Date: 05/14/2021 CLINICAL DATA:  Chest pain EXAM: CHEST - 2 VIEW COMPARISON:  06/03/2012 FINDINGS: Heart and mediastinal contours are within normal limits. No focal opacities or effusions. No acute bony abnormality. IMPRESSION: No active cardiopulmonary disease. Electronically Signed   By: Rolm Baptise M.D.   On: 05/14/2021 08:57     ____________________________________________   PROCEDURES  Procedure(s) performed (including Critical Care):  .1-3 Lead EKG Interpretation  Date/Time: 05/14/2021 12:01 PM Performed by: Naaman Plummer, MD Authorized by: Naaman Plummer, MD     Interpretation: normal     ECG rate:  68   ECG rate assessment: normal     Rhythm: sinus rhythm     Ectopy: none     Conduction: normal     ____________________________________________   INITIAL IMPRESSION / ASSESSMENT AND PLAN / ED COURSE  As part of my medical decision making, I reviewed the following data within the electronic medical record, if available:  Nursing notes reviewed and incorporated, Labs reviewed, EKG interpreted, Old chart reviewed, Radiograph reviewed and Notes from prior ED visits reviewed and incorporated     Workup: ECG, CXR, CBC, BMP, Troponin Findings: ECG: No overt evidence of STEMI. No evidence of Brugadas sign, delta wave, epsilon wave, significantly prolonged QTc, or malignant arrhythmia HS Troponin: Negative x1 Other Labs unremarkable for emergent problems. CXR: Without PTX, PNA, or widened mediastinum Last Stress Test: Never Last Heart Catheterization: Never HEART Score: 3  Given History, Exam, and Workup I have low suspicion for ACS, Pneumothorax, Pneumonia, Pulmonary Embolus, Tamponade, Aortic Dissection or other emergent problem as a cause for this presentation.   Reassesment: Prior to discharge patients pain was controlled and they were well appearing.  Disposition:  Discharge. Strict return precautions discussed with patient with full understanding. Advised patient to follow up promptly with primary care provider        ____________________________________________   FINAL CLINICAL IMPRESSION(S) / ED DIAGNOSES  Final diagnoses:  Chest pain, unspecified type     ED Discharge Orders     None        Note:  This document was prepared using Dragon voice recognition software  and may include unintentional dictation errors.    Naaman Plummer, MD 05/14/21 1201

## 2021-05-14 NOTE — Telephone Encounter (Signed)
FYI

## 2021-05-14 NOTE — Telephone Encounter (Signed)
Reason for Disposition  [1] Chest pain (or "angina") comes and goes AND [2] is happening more often (increasing in frequency) or getting worse (increasing in severity) (Exception: chest pains that last only a few seconds)  Answer Assessment - Initial Assessment Questions 1. LOCATION: "Where does it hurt?"       On left 2. RADIATION: "Does the pain go anywhere else?" (e.g., into neck, jaw, arms, back)     Sometimes back 3. ONSET: "When did the chest pain begin?" (Minutes, hours or days)      Last week 4. PATTERN "Does the pain come and go, or has it been constant since it started?"  "Does it get worse with exertion?"      Comes and goes 5. DURATION: "How long does it last" (e.g., seconds, minutes, hours)     Several minutes 6. SEVERITY: "How bad is the pain?"  (e.g., Scale 1-10; mild, moderate, or severe)    - MILD (1-3): doesn't interfere with normal activities     - MODERATE (4-7): interferes with normal activities or awakens from sleep    - SEVERE (8-10): excruciating pain, unable to do any normal activities       Now- 1 7. CARDIAC RISK FACTORS: "Do you have any history of heart problems or risk factors for heart disease?" (e.g., angina, prior heart attack; diabetes, high blood pressure, high cholesterol, smoker, or strong family history of heart disease)     No 8. PULMONARY RISK FACTORS: "Do you have any history of lung disease?"  (e.g., blood clots in lung, asthma, emphysema, birth control pills)     Asthma 9. CAUSE: "What do you think is causing the chest pain?"     Maybe stress 10. OTHER SYMPTOMS: "Do you have any other symptoms?" (e.g., dizziness, nausea, vomiting, sweating, fever, difficulty breathing, cough)       No 11. PREGNANCY: "Is there any chance you are pregnant?" "When was your last menstrual period?"       No  Protocols used: Chest Pain-A-AH

## 2021-05-14 NOTE — ED Triage Notes (Signed)
Pt c/o chest pain that radiating into the her back intermittently over the past week

## 2021-05-14 NOTE — Discharge Instructions (Addendum)
Please begin taking Pepcid/Nexium/Prilosec over-the-counter until insurance approves your normal scheduled medications.

## 2021-09-03 ENCOUNTER — Ambulatory Visit (INDEPENDENT_AMBULATORY_CARE_PROVIDER_SITE_OTHER): Payer: BC Managed Care – PPO | Admitting: Family Medicine

## 2021-09-03 ENCOUNTER — Other Ambulatory Visit: Payer: Self-pay

## 2021-09-03 ENCOUNTER — Encounter: Payer: Self-pay | Admitting: Family Medicine

## 2021-09-03 VITALS — BP 125/75 | HR 72 | Resp 16 | Wt 166.7 lb

## 2021-09-03 DIAGNOSIS — M545 Low back pain, unspecified: Secondary | ICD-10-CM | POA: Diagnosis not present

## 2021-09-03 DIAGNOSIS — B351 Tinea unguium: Secondary | ICD-10-CM | POA: Insufficient documentation

## 2021-09-03 DIAGNOSIS — M79675 Pain in left toe(s): Secondary | ICD-10-CM | POA: Diagnosis not present

## 2021-09-03 MED ORDER — BACLOFEN 20 MG PO TABS: 20.0000 mg | ORAL_TABLET | Freq: Three times a day (TID) | ORAL | 0 refills | Status: DC

## 2021-09-03 MED ORDER — MELOXICAM 15 MG PO TABS: 15.0000 mg | ORAL_TABLET | Freq: Every day | ORAL | 0 refills | Status: DC

## 2021-09-03 NOTE — Assessment & Plan Note (Addendum)
Discoloration seen on 4th toes bilateral as well as L great toenail

## 2021-09-03 NOTE — Assessment & Plan Note (Signed)
Torn left 4th toenail

## 2021-09-03 NOTE — Progress Notes (Signed)
Established patient visit   Patient: Angela Reyes   DOB: 1960-12-26   60 y.o. Female  MRN: [id number] Visit Date: 09/03/2021  Today's healthcare provider: Gwyneth Sprout, FNP   Chief Complaint  Patient presents with   Toe Pain    Patient reports concerns of darkening of her nail on her toes and states that her 4th digit nail is coming off. Patient denies any trauma to feet or recent pedicure.    Back Pain   Subjective    HPI HPI     Toe Pain    Additional comments: Patient reports concerns of darkening of her nail on her toes and states that her 4th digit nail is coming off. Patient denies any trauma to feet or recent pedicure.         Back Pain   This is a new problem.  Recent episode started 1 to 4 weeks ago.  Pain is lumbar spine.  The symptoms are aggravated by bending and lying down.  Symptoms are relieved by nothing.  Treatments: application of heat.  Abdominal Pain: Absent.  Bowel incontinence: Absent.  Chest pain: Absent.  Dysuria: Absent.  Fever: Absent.  Headaches: Absent.  Joint pains: Present.  Weakness in leg: Absent.  Pelvic pain: Absent.  Tingling in lower extremities: Absent.  Urinary incontinence: Absent.  Weight loss: Absent.      Last edited by Minette Headland, CMA on 09/03/2021  2:20 PM.        Medications: Outpatient Medications Prior to Visit  Medication Sig Note   albuterol (PROAIR HFA) 108 (90 Base) MCG/ACT inhaler Inhale 2 puffs into the lungs every 6 (six) hours as needed for wheezing or shortness of breath.    albuterol (PROVENTIL) (2.5 MG/3ML) 0.083% nebulizer solution TAKE 3 MLS BY NEBULIZATION EVERY 6 HOURS AS NEEDED FOR WHEEZING OR SHORTNESS OF BREATH.    cetirizine (ZYRTEC) 10 MG tablet Take 10 mg by mouth at bedtime. Reported on 05/01/2016    conjugated estrogens (PREMARIN) vaginal cream Place 1 Applicatorful vaginally daily.    fluticasone furoate-vilanterol (BREO ELLIPTA) 200-25 MCG/INH AEPB INHALE 1 PUFF INTO THE LUNGS EVERY  EVENING.    Fluticasone Propionate 93 MCG/ACT EXHU Place into the nose.    montelukast (SINGULAIR) 10 MG tablet Take 1 tablet (10 mg total) by mouth at bedtime.    pantoprazole (PROTONIX) 40 MG tablet Take 1 tablet (40 mg total) by mouth daily. 09/03/2021: Patient reports otc   [DISCONTINUED] predniSONE (DELTASONE) 10 MG tablet Take 6 tabs PO on day 1&2, 5 tabs PO on day 3&4, 4 tabs PO on day 5&6, 3 tabs PO on day 7&8, 2 tabs PO on day 9&10, 1 tab PO on day 11&12.    [DISCONTINUED] azithromycin (ZITHROMAX) 250 MG tablet Take 2 tablets PO on day one, and one tablet PO daily thereafter until completed. (Patient not taking: Reported on 09/03/2021)    No facility-administered medications prior to visit.    Review of Systems      Objective    BP 125/75   Pulse 72   Resp 16   Wt 166 lb 11.2 oz (75.6 kg)   LMP 02/10/2015 Comment: 1st one since dec 2014  SpO2 99%   BMI 24.62 kg/m     Physical Exam Vitals and nursing note reviewed.  Constitutional:      General: She is not in acute distress.    Appearance: Normal appearance. She is normal weight. She is not ill-appearing, toxic-appearing  or diaphoretic.  HENT:     Head: Normocephalic and atraumatic.  Cardiovascular:     Rate and Rhythm: Normal rate and regular rhythm.     Pulses: Normal pulses.     Heart sounds: Normal heart sounds. No murmur heard.   No friction rub. No gallop.  Pulmonary:     Effort: Pulmonary effort is normal. No respiratory distress.     Breath sounds: Normal breath sounds. No stridor. No wheezing, rhonchi or rales.  Chest:     Chest wall: No tenderness.  Abdominal:     General: Bowel sounds are normal.     Palpations: Abdomen is soft.  Musculoskeletal:        General: Tenderness present. No swelling, deformity or signs of injury. Normal range of motion.       Arms:     Right lower leg: No edema.     Left lower leg: No edema.       Feet:  Skin:    General: Skin is warm and dry.     Capillary Refill:  Capillary refill takes less than 2 seconds.     Coloration: Skin is not jaundiced or pale.     Findings: No bruising, erythema, lesion or rash.  Neurological:     General: No focal deficit present.     Mental Status: She is alert and oriented to person, place, and time. Mental status is at baseline.     Cranial Nerves: No cranial nerve deficit.     Sensory: No sensory deficit.     Motor: No weakness.     Coordination: Coordination normal.  Psychiatric:        Mood and Affect: Mood normal.        Behavior: Behavior normal.        Thought Content: Thought content normal.        Judgment: Judgment normal.      No results found for any visits on 09/03/21.  Assessment & Plan     Problem List Items Addressed This Visit       Musculoskeletal and Integument   Pain around toenail, left foot    Torn left 4th toenail      Relevant Orders   Ambulatory referral to Podiatry   Toenail fungus    Discoloration seen on 4th toes bilateral as well as L great toenail      Relevant Orders   Ambulatory referral to Podiatry     Other   Acute right-sided low back pain without sciatica - Primary    No mechanism of injury Trial of Mobic and PM muscle relaxant Recommend PT if pain and limitations continue      Relevant Medications   meloxicam (MOBIC) 15 MG tablet   baclofen (LIORESAL) 20 MG tablet     Return if symptoms worsen or fail to improve.      Vonna Kotyk, FNP, have reviewed all documentation for this visit. The documentation on 09/03/21 for the exam, diagnosis, procedures, and orders are all accurate and complete.    Gwyneth Sprout, [place] [phone] (phone) [phone] (fax)  [place]

## 2021-09-03 NOTE — Assessment & Plan Note (Signed)
No mechanism of injury Trial of Mobic and PM muscle relaxant Recommend PT if pain and limitations continue

## 2021-10-02 ENCOUNTER — Other Ambulatory Visit: Payer: Self-pay | Admitting: Family Medicine

## 2021-10-02 NOTE — Telephone Encounter (Signed)
Requested medication (s) are due for refill today: yes  Requested medication (s) are on the active medication list: yes  Last refill:  09/03/21  Future visit scheduled: no  Notes to clinic:  This was ordered for an acute pain and unclear if is to be continued, please assess.   Requested Prescriptions  Pending Prescriptions Disp Refills   meloxicam (MOBIC) 15 MG tablet [Pharmacy Med Name: MELOXICAM 15 MG TABLET] 30 tablet 0    Sig: Take 1 tablet (15 mg total) by mouth daily.     Analgesics:  COX2 Inhibitors Passed - 10/02/2021  1:34 AM      Passed - HGB in normal range and within 360 days    Hemoglobin  Date Value Ref Range Status  05/14/2021 14.5 12.0 - 15.0 g/dL Final          Passed - Cr in normal range and within 360 days    Creatinine, Ser  Date Value Ref Range Status  05/14/2021 0.99 0.44 - 1.00 mg/dL Final          Passed - Patient is not pregnant      Passed - Valid encounter within last 12 months    Recent Outpatient Visits           4 weeks ago Acute right-sided low back pain without sciatica   [facility] T, FNP   10 months ago Moderate persistent asthmatic bronchitis with acute exacerbation   [facility] [place], [place]ce Sorrel, [place]   2 years ago Exposure to [place], [place] Sorrel, [place]   2 years ago Vaginal atrophy   [place], [place]   2 years ago Moderate persistent asthmatic bronchitis with acute exacerbation   [facility] [place], [place], [place]

## 2021-10-03 NOTE — Telephone Encounter (Signed)
Please advise refill?  LOV: 09/03/2021 NOV: None Last refill: 09/03/2021 #30 w/0 refills

## 2021-12-04 ENCOUNTER — Telehealth (INDEPENDENT_AMBULATORY_CARE_PROVIDER_SITE_OTHER): Payer: BC Managed Care – PPO | Admitting: Family Medicine

## 2021-12-04 ENCOUNTER — Other Ambulatory Visit: Payer: Self-pay

## 2021-12-04 DIAGNOSIS — J4541 Moderate persistent asthma with (acute) exacerbation: Secondary | ICD-10-CM | POA: Diagnosis not present

## 2021-12-04 MED ORDER — PREDNISONE 10 MG PO TABS: ORAL_TABLET | ORAL | 0 refills | Status: DC

## 2021-12-04 MED ORDER — AZITHROMYCIN 250 MG PO TABS
ORAL_TABLET | ORAL | 0 refills | Status: AC
Start: 2021-12-04 — End: 2021-12-09

## 2021-12-04 MED ORDER — DOXYCYCLINE HYCLATE 100 MG PO TABS
100.0000 mg | ORAL_TABLET | Freq: Two times a day (BID) | ORAL | 0 refills | Status: DC
Start: 2021-12-04 — End: 2022-03-08

## 2021-12-04 NOTE — Progress Notes (Signed)
MyChart Video Visit    Virtual Visit via Video Note   This visit type was conducted due to national recommendations for restrictions regarding the COVID-19 Pandemic (e.g. social distancing) in an effort to limit this patient's exposure and mitigate transmission in our community. This patient is at least at moderate risk for complications without adequate follow up. This format is felt to be most appropriate for this patient at this time. Physical exam was limited by quality of the video and audio technology used for the visit.   Patient location: home Provider location: [facility]  I discussed the limitations of evaluation and management by telemedicine and the availability of in person appointments. The patient expressed understanding and agreed to proceed.  Patient: Angela Reyes   DOB: 02-11-1961   61 y.o. Female  MRN: [id number] Visit Date: 12/04/2021  Today's healthcare provider: Gwyneth Sprout, FNP   No chief complaint on file.  Subjective    HPI  Upper respiratory symptoms Patient states that she has had congestion since Christmas.  She states this is not all that uncommon for her.  She has history of asthma and has episodes yearly.   She states that over the last week or two the congestion has moved to her chest and she has been having more wheezing. She has been using her inhalers more now aw well.  Patient has not tested for Covid in a few months.    ---------------------------------------------------------------------------------------------------    Medications: Outpatient Medications Prior to Visit  Medication Sig   albuterol (PROAIR HFA) 108 (90 Base) MCG/ACT inhaler Inhale 2 puffs into the lungs every 6 (six) hours as needed for wheezing or shortness of breath.   albuterol (PROVENTIL) (2.5 MG/3ML) 0.083% nebulizer solution TAKE 3 MLS BY NEBULIZATION EVERY 6 HOURS AS NEEDED FOR WHEEZING OR SHORTNESS OF BREATH.   baclofen (LIORESAL) 20 MG tablet Take 1 tablet (20 mg  total) by mouth 3 (three) times daily.   cetirizine (ZYRTEC) 10 MG tablet Take 10 mg by mouth at bedtime. Reported on 05/01/2016   conjugated estrogens (PREMARIN) vaginal cream Place 1 Applicatorful vaginally daily.   esomeprazole (NEXIUM) 20 MG capsule Take 20 mg by mouth daily at 12 noon.   fluticasone furoate-vilanterol (BREO ELLIPTA) 200-25 MCG/INH AEPB INHALE 1 PUFF INTO THE LUNGS EVERY EVENING.   Fluticasone Propionate 93 MCG/ACT EXHU Place into the nose.   montelukast (SINGULAIR) 10 MG tablet Take 1 tablet (10 mg total) by mouth at bedtime.   [DISCONTINUED] meloxicam (MOBIC) 15 MG tablet TAKE 1 TABLET (15 MG TOTAL) BY MOUTH DAILY.   [DISCONTINUED] pantoprazole (PROTONIX) 40 MG tablet Take 1 tablet (40 mg total) by mouth daily.   No facility-administered medications prior to visit.    Review of Systems  Constitutional:  Positive for fatigue. Negative for chills, diaphoresis and fever.  HENT:  Positive for congestion, ear pain, postnasal drip, rhinorrhea, sinus pressure, sinus pain, sneezing and tinnitus. Negative for ear discharge, facial swelling, hearing loss, nosebleeds and sore throat.   Eyes:  Negative for pain, discharge, redness and itching.  Respiratory:  Positive for cough, chest tightness, shortness of breath and wheezing.   Cardiovascular:  Negative for chest pain.  Gastrointestinal:  Negative for abdominal pain, diarrhea, nausea and vomiting.  Musculoskeletal:  Negative for myalgias.  Neurological:  Positive for light-headedness and headaches. Negative for dizziness.     Objective    LMP 02/10/2015 Comment: 1st one since dec 2014   Physical Exam Nursing note reviewed.  Constitutional:  General: She is not in acute distress.    Appearance: Normal appearance. She is not ill-appearing or toxic-appearing.  Pulmonary:     Effort: Pulmonary effort is normal. No respiratory distress.     Breath sounds: No wheezing.  Neurological:     Mental Status: She is alert.   Psychiatric:        Mood and Affect: Mood normal.        Behavior: Behavior normal.        Thought Content: Thought content normal.        Judgment: Judgment normal.       Assessment & Plan     Problem List Items Addressed This Visit       Respiratory   Moderate persistent asthma with exacerbation - Primary    First flare in >12 months Has been using SABA 3 times per day for 3+ days Use of controller as well Recommend change of zyretc to claritin Recommend f/u with pulm for repeat PFTs when recovered Encouraged home COVID test Will send pred and abx given worsening symptoms and pt report of wheezing and atypical, likely MSK, chest pain Encourage f/u if symptoms worsen or seek emergent care      Relevant Medications   predniSONE (DELTASONE) 10 MG tablet   azithromycin (ZITHROMAX) 250 MG tablet   doxycycline (VIBRA-TABS) 100 MG tablet   Other Relevant Orders   DG Chest 2 View     No follow-ups on file.     I discussed the assessment and treatment plan with the patient. The patient was provided an opportunity to ask questions and all were answered. The patient agreed with the plan and demonstrated an understanding of the instructions.   The patient was advised to call back or seek an in-person evaluation if the symptoms worsen or if the condition fails to improve as anticipated.  Argentina Ponder DeSanto,acting as a scribe for Gwyneth Sprout, FNP.,have documented all relevant documentation on the behalf of Gwyneth Sprout, FNP,as directed by  Gwyneth Sprout, FNP while in the presence of Gwyneth Sprout, FNP.  I provided 8 minutes of non-face-to-face time during this encounter.  Vonna Kotyk, FNP, have reviewed all documentation for this visit. The documentation on 12/04/21 for the exam, diagnosis, procedures, and orders are all accurate and complete.   Gwyneth Sprout, [place] [phone] (phone) [phone] (fax)  [place]

## 2021-12-04 NOTE — Assessment & Plan Note (Signed)
First flare in >12 months Has been using SABA 3 times per day for 3+ days Use of controller as well Recommend change of zyretc to claritin Recommend f/u with pulm for repeat PFTs when recovered Encouraged home COVID test Will send pred and abx given worsening symptoms and pt report of wheezing and atypical, likely MSK, chest pain Encourage f/u if symptoms worsen or seek emergent care

## 2021-12-05 ENCOUNTER — Encounter: Payer: Self-pay | Admitting: Family Medicine

## 2021-12-05 DIAGNOSIS — J4540 Moderate persistent asthma, uncomplicated: Secondary | ICD-10-CM

## 2021-12-06 MED ORDER — FLUTICASONE FUROATE-VILANTEROL 200-25 MCG/ACT IN AEPB: INHALATION_SPRAY | RESPIRATORY_TRACT | 5 refills | Status: DC

## 2021-12-10 ENCOUNTER — Other Ambulatory Visit: Payer: Self-pay | Admitting: Physician Assistant

## 2021-12-10 DIAGNOSIS — J3089 Other allergic rhinitis: Secondary | ICD-10-CM

## 2022-03-07 ENCOUNTER — Ambulatory Visit: Payer: Self-pay

## 2022-03-07 ENCOUNTER — Encounter: Payer: Self-pay | Admitting: Family Medicine

## 2022-03-07 ENCOUNTER — Other Ambulatory Visit: Payer: Self-pay | Admitting: Physician Assistant

## 2022-03-07 DIAGNOSIS — J45991 Cough variant asthma: Secondary | ICD-10-CM

## 2022-03-07 NOTE — Telephone Encounter (Signed)
Chief Complaint: SOB Symptoms: Mild SOB with activity, wheezing, chest tightness, cough, nasal congestion, runny nose Frequency: Onset beginning of week Pertinent Negatives: Patient denies chest pain, fever Disposition: '[]'$ ED /'[]'$ Urgent Care (no appt availability in office) / '[x]'$ Appointment(In office/virtual)/ '[]'$  [facility]/ '[]'$ Home Care/ '[]'$ Refused Recommended Disposition /'[]'$  Mobile Bus/ '[]'$  Follow-up with PCP Additional Notes: N/A    Reason for Disposition  [1] MODERATE longstanding difficulty breathing (e.g., speaks in phrases, SOB even at rest, pulse 100-120) AND [2] SAME as normal  Answer Assessment - Initial Assessment Questions 1. RESPIRATORY STATUS: "Describe your breathing?" (e.g., wheezing, shortness of breath, unable to speak, severe coughing)      SOB, slightly wheezing 2. ONSET: "When did this breathing problem begin?"      Beginning of the week 3. PATTERN "Does the difficult breathing come and go, or has it been constant since it started?"      Come and go, more with activity 4. SEVERITY: "How bad is your breathing?" (e.g., mild, moderate, severe)    - MILD: No SOB at rest, mild SOB with walking, speaks normally in sentences, can lie down, no retractions, pulse < 100.    - MODERATE: SOB at rest, SOB with minimal exertion and prefers to sit, cannot lie down flat, speaks in phrases, mild retractions, audible wheezing, pulse 100-120.    - SEVERE: Very SOB at rest, speaks in single words, struggling to breathe, sitting hunched forward, retractions, pulse > 120      Mild 5. RECURRENT SYMPTOM: "Have you had difficulty breathing before?" If Yes, ask: "When was the last time?" and "What happened that time?"      Yes have asthma 6. CARDIAC HISTORY: "Do you have any history of heart disease?" (e.g., heart attack, angina, bypass surgery, angioplasty)      No 7. LUNG HISTORY: "Do you have any history of lung disease?"  (e.g., pulmonary embolus, asthma, emphysema)      Yes 8. CAUSE: "What do you think is causing the breathing problem?"      Asthma flare 9. OTHER SYMPTOMS: "Do you have any other symptoms? (e.g., dizziness, runny nose, cough, chest pain, fever)     Nasal drainage, cough, runny nose, chest tightness due to the breathing 10. O2 SATURATION MONITOR:  "Do you use an oxygen saturation monitor (pulse oximeter) at home?" If Yes, "What is your reading (oxygen level) today?" "What is your usual oxygen saturation reading?" (e.g., 95%)       No 11. PREGNANCY: "Is there any chance you are pregnant?" "When was your last menstrual period?"       N/A 12. TRAVEL: "Have you traveled out of the country in the last month?" (e.g., travel history, exposures)       N/A  Protocols used: Breathing Difficulty-A-AH

## 2022-03-08 ENCOUNTER — Telehealth (INDEPENDENT_AMBULATORY_CARE_PROVIDER_SITE_OTHER): Payer: BC Managed Care – PPO | Admitting: Family Medicine

## 2022-03-08 DIAGNOSIS — J4541 Moderate persistent asthma with (acute) exacerbation: Secondary | ICD-10-CM

## 2022-03-08 MED ORDER — METHYLPREDNISOLONE 4 MG PO TBPK: ORAL_TABLET | ORAL | 0 refills | Status: DC

## 2022-03-08 MED ORDER — IPRATROPIUM BROMIDE 0.03 % NA SOLN: 2.0000 | Freq: Two times a day (BID) | NASAL | 12 refills | Status: DC

## 2022-03-08 MED ORDER — AMOXICILLIN-POT CLAVULANATE 875-125 MG PO TABS: 1.0000 | ORAL_TABLET | Freq: Two times a day (BID) | ORAL | 0 refills | Status: DC

## 2022-03-08 NOTE — Progress Notes (Signed)
MyChart Video Visit    Virtual Visit via Video Note   This visit type was conducted due to national recommendations for restrictions regarding the COVID-19 Pandemic (e.g. social distancing) in an effort to limit this patient's exposure and mitigate transmission in our community. This patient is at least at moderate risk for complications without adequate follow up. This format is felt to be most appropriate for this patient at this time. Physical exam was limited by quality of the video and audio technology used for the visit.   Patient location: home  Provider location:  [facility] [address]  [place] [address] [place], [place] [address]   I discussed the limitations of evaluation and management by telemedicine and the availability of in person appointments. The patient expressed understanding and agreed to proceed.  Patient: Angela Reyes   DOB: [birth year]   61 y.o. Female  MRN: [id number] Visit Date: 03/08/2022  Today's healthcare provider: Gwyneth Sprout, FNP  Re Introduced to nurse practitioner role and practice setting.  All questions answered.  Discussed provider/patient relationship and expectations.  Difficulty taking a deep breath, wheezing, having to use inhaler every night.   Subjective    Patient reports tightness in chest "from my asthma." Reports no SOB, just some wheezing.  Onset last weekend and progressively getting worse. Some coughing /dry, with drainage in back of throat and runny nose.  Taking Zyrtec and using Breo inhaler. No fever. Denies sick contacts. Denies travels. Endorses sinus headaches, could be due to weather variability.   Medications: Outpatient Medications Prior to Visit  Medication Sig   albuterol (PROAIR HFA) 108 (90 Base) MCG/ACT inhaler Inhale 2 puffs into the lungs every 6 (six) hours as needed for wheezing or shortness of breath.   albuterol (PROVENTIL) (2.5 MG/3ML) 0.083% nebulizer solution TAKE 3 MLS BY NEBULIZATION EVERY  6 HOURS AS NEEDED FOR WHEEZING OR SHORTNESS OF BREATH.   cetirizine (ZYRTEC) 10 MG tablet Take 10 mg by mouth at bedtime. Reported on 05/01/2016   conjugated estrogens (PREMARIN) vaginal cream Place 1 Applicatorful vaginally daily.   esomeprazole (NEXIUM) 20 MG capsule Take 20 mg by mouth daily at 12 noon.   fluticasone furoate-vilanterol (BREO ELLIPTA) 200-25 MCG/ACT AEPB INHALE 1 PUFF INTO THE LUNGS EVERY EVENING.   Fluticasone Propionate 93 MCG/ACT EXHU Place into the nose.   montelukast (SINGULAIR) 10 MG tablet TAKE 1 TABLET BY MOUTH EVERYDAY AT BEDTIME   [DISCONTINUED] baclofen (LIORESAL) 20 MG tablet Take 1 tablet (20 mg total) by mouth 3 (three) times daily.   [DISCONTINUED] doxycycline (VIBRA-TABS) 100 MG tablet Take 1 tablet (100 mg total) by mouth 2 (two) times daily.   [DISCONTINUED] predniSONE (DELTASONE) 10 MG tablet Days 1&2 6 tablets by mouth, Days 3&4 5 tablets by mouth, Days 5&6 4 tablets by mouth, Days 7&8 3 tablets by mouth, Days 9&10 2 tablets by mouth, Days 11&12 1 tablet by mouth (Patient not taking: Reported on 03/08/2022)   No facility-administered medications prior to visit.    Review of Systems  Currently using rescue inhaler once/night; and three times/day. Using breo as ordered. Using singular. Using zyrtec.   Objective    LMP 02/10/2015 Comment: 1st one since dec 2014     Physical Exam Constitutional:      Appearance: Normal appearance.  HENT:     Head: Normocephalic and atraumatic.  Pulmonary:     Effort: Pulmonary effort is normal.  Neurological:     General: No focal deficit present.  Mental Status: She is alert and oriented to person, place, and time.  Psychiatric:        Mood and Affect: Mood normal.        Behavior: Behavior normal.        Thought Content: Thought content normal.        Judgment: Judgment normal.        Assessment & Plan     Problem List Items Addressed This Visit       Respiratory   Moderate persistent asthma  with exacerbation - Primary    Acute on chronic, with acute exacerbation 1 week course, currently using PRN inhaler, Albuterol TID Daily, PRN nightly x1 with daily inhaler, BREO Using antihistamine, Zyrtec Using Singulair as well, QD Denies travel Denies sick contacts Endorses inspiratory wheeze; denies expiratory wheeze Denies rhonchi Endorses cough; primarily dry; however, productive upon awakening, clear sputum Chronic rhinitis- will use nasal spray to assist ABX on standby given w/e upcoming to prevent UC need Previous on pred taper in 2/23; will use medrol dose pack at this time Unclear of cause for exacerbation given previous good course of management RTC in 1-2 weeks if needed; seek emergent care if indicated/approprate        Relevant Medications   amoxicillin-clavulanate (AUGMENTIN) 875-125 MG tablet   methylPREDNISolone (MEDROL DOSEPAK) 4 MG TBPK tablet   ipratropium (ATROVENT) 0.03 % nasal spray     Return if symptoms worsen or fail to improve.     I discussed the assessment and treatment plan with the patient. The patient was provided an opportunity to ask questions and all were answered. The patient agreed with the plan and demonstrated an understanding of the instructions.   The patient was advised to call back or seek an in-person evaluation if the symptoms worsen or if the condition fails to improve as anticipated.  I provided 10 minutes of non-face-to-face time during this encounter.  Vonna Kotyk, FNP, have reviewed all documentation for this visit. The documentation on 03/08/22 for the exam, diagnosis, procedures, and orders are all accurate and complete.   Gwyneth Sprout, [place] [phone] (phone) [phone] (fax)  [place]

## 2022-03-08 NOTE — Assessment & Plan Note (Addendum)
Acute on chronic, with acute exacerbation 1 week course, currently using PRN inhaler, Albuterol TID Daily, PRN nightly x1 with daily inhaler, BREO Using antihistamine, Zyrtec Using Singulair as well, QD Denies travel Denies sick contacts Endorses inspiratory wheeze; denies expiratory wheeze Denies rhonchi Endorses cough; primarily dry; however, productive upon awakening, clear sputum Chronic rhinitis- will use nasal spray to assist ABX on standby given w/e upcoming to prevent UC need Previous on pred taper in 2/23; will use medrol dose pack at this time Unclear of cause for exacerbation given previous good course of management RTC in 1-2 weeks if needed; seek emergent care if indicated/approprate

## 2022-03-12 ENCOUNTER — Encounter: Payer: Self-pay | Admitting: Family Medicine

## 2022-03-12 ENCOUNTER — Other Ambulatory Visit: Payer: Self-pay | Admitting: Family Medicine

## 2022-03-12 DIAGNOSIS — J45991 Cough variant asthma: Secondary | ICD-10-CM

## 2022-03-12 MED ORDER — ALBUTEROL SULFATE HFA 108 (90 BASE) MCG/ACT IN AERS: 2.0000 | INHALATION_SPRAY | Freq: Four times a day (QID) | RESPIRATORY_TRACT | 5 refills | Status: DC | PRN

## 2022-03-14 ENCOUNTER — Encounter: Payer: Self-pay | Admitting: Family Medicine

## 2022-03-14 ENCOUNTER — Other Ambulatory Visit: Payer: Self-pay | Admitting: Family Medicine

## 2022-03-14 DIAGNOSIS — J4541 Moderate persistent asthma with (acute) exacerbation: Secondary | ICD-10-CM

## 2022-03-14 MED ORDER — PREDNISONE 10 MG PO TABS: ORAL_TABLET | ORAL | 0 refills | Status: DC

## 2022-04-18 ENCOUNTER — Ambulatory Visit: Payer: Self-pay

## 2022-04-18 ENCOUNTER — Encounter: Payer: Self-pay | Admitting: Family Medicine

## 2022-04-18 ENCOUNTER — Ambulatory Visit
Admission: RE | Admit: 2022-04-18 | Discharge: 2022-04-18 | Disposition: A | Discharge status: Home / Self Care | Payer: BC Managed Care – PPO | Attending: Family Medicine | Admitting: Family Medicine

## 2022-04-18 ENCOUNTER — Ambulatory Visit (INDEPENDENT_AMBULATORY_CARE_PROVIDER_SITE_OTHER): Payer: BC Managed Care – PPO | Admitting: Family Medicine

## 2022-04-18 ENCOUNTER — Ambulatory Visit
Admission: RE | Admit: 2022-04-18 | Discharge: 2022-04-18 | Disposition: A | Discharge status: Home / Self Care | Payer: BC Managed Care – PPO | Source: Ambulatory Visit | Attending: Family Medicine | Admitting: Family Medicine

## 2022-04-18 DIAGNOSIS — J4541 Moderate persistent asthma with (acute) exacerbation: Secondary | ICD-10-CM | POA: Diagnosis not present

## 2022-04-18 DIAGNOSIS — J4540 Moderate persistent asthma, uncomplicated: Secondary | ICD-10-CM | POA: Diagnosis not present

## 2022-04-18 MED ORDER — LEVOFLOXACIN 750 MG PO TABS: 750.0000 mg | ORAL_TABLET | Freq: Every day | ORAL | 0 refills | Status: DC

## 2022-04-18 MED ORDER — PREDNISONE 10 MG PO TABS: ORAL_TABLET | ORAL | 0 refills | Status: DC

## 2022-04-18 NOTE — Progress Notes (Signed)
Established patient visit   Patient: Angela Reyes   DOB: [birth year]   61 y.o. Female  MRN: [id number] Visit Date: 04/18/2022  Today's healthcare provider: Gwyneth Sprout, FNP  Introduced to nurse practitioner role and practice setting.  All questions answered.  Discussed provider/patient relationship and expectations.   I,Tiffany J Bragg,acting as a scribe for Gwyneth Sprout, FNP.,have documented all relevant documentation on the behalf of Gwyneth Sprout, FNP,as directed by  Gwyneth Sprout, FNP while in the presence of Gwyneth Sprout, FNP.  Subjective    HPI HPI     Asthma    Additional comments: Patient complains of asthma getting worse this week. States she used a nebulizer last night and rescue inhalers daily that help short-term.       Last edited by Smitty Knudsen, CMA on 04/18/2022 11:10 AM.       Medications: Outpatient Medications Prior to Visit  Medication Sig   albuterol (PROVENTIL) (2.5 MG/3ML) 0.083% nebulizer solution TAKE 3 MLS BY NEBULIZATION EVERY 6 HOURS AS NEEDED FOR WHEEZING OR SHORTNESS OF BREATH.   albuterol (VENTOLIN HFA) 108 (90 Base) MCG/ACT inhaler TAKE 2 PUFFS BY MOUTH EVERY 6 HOURS AS NEEDED FOR WHEEZE OR SHORTNESS OF BREATH   cetirizine (ZYRTEC) 10 MG tablet Take 10 mg by mouth at bedtime. Reported on 05/01/2016   conjugated estrogens (PREMARIN) vaginal cream Place 1 Applicatorful vaginally daily.   esomeprazole (NEXIUM) 20 MG capsule Take 20 mg by mouth daily at 12 noon.   fluticasone furoate-vilanterol (BREO ELLIPTA) 200-25 MCG/ACT AEPB INHALE 1 PUFF INTO THE LUNGS EVERY EVENING.   Fluticasone Propionate 93 MCG/ACT EXHU Place into the nose.   ipratropium (ATROVENT) 0.03 % nasal spray Place 2 sprays into both nostrils every 12 (twelve) hours.   montelukast (SINGULAIR) 10 MG tablet TAKE 1 TABLET BY MOUTH EVERYDAY AT BEDTIME   [DISCONTINUED] amoxicillin-clavulanate (AUGMENTIN) 875-125 MG tablet Take 1 tablet by mouth 2 (two) times daily.    [DISCONTINUED] predniSONE (DELTASONE) 10 MG tablet Day 1 take 6 tablets Day 2 take 6 tablets Day 3 take 5 tablets Day 4 take 5 tablets Day 5 take 4 tablets Day 6 take 4 tablets Day 7 take 3 tablets Day 8 take 3 tablets Day 9 take 2 tablets Day 10 take 2 tablets Day 11 take 1 tablet Day 12 take 1 tablet Day 13 take 1/2 tablet Day 14 take 1/2 tablet   No facility-administered medications prior to visit.    Review of Systems     Objective    BP 135/87 (BP Location: Left Arm, Patient Position: Sitting, Cuff Size: Normal)   Pulse 76   Temp 97.9 F (36.6 C) (Oral)   Resp 19   Ht '5\' 9"'$  (1.753 m)   Wt 170 lb 3.2 oz (77.2 kg)   LMP 02/10/2015 Comment: 1st one since dec 2014  SpO2 96%   BMI 25.13 kg/m    Physical Exam Vitals and nursing note reviewed.  Constitutional:      General: She is not in acute distress.    Appearance: Normal appearance. She is normal weight. She is not ill-appearing, toxic-appearing or diaphoretic.  HENT:     Head: Normocephalic and atraumatic.     Nose: Nose normal.  Cardiovascular:     Rate and Rhythm: Normal rate and regular rhythm.     Pulses: Normal pulses.     Heart sounds: Normal heart sounds. No murmur heard.    No friction  rub. No gallop.  Pulmonary:     Effort: Pulmonary effort is normal. No respiratory distress.     Breath sounds: Decreased air movement present. No stridor. Examination of the right-upper field reveals wheezing. Examination of the left-upper field reveals wheezing. Examination of the right-lower field reveals decreased breath sounds. Examination of the left-lower field reveals decreased breath sounds. Decreased breath sounds and wheezing present. No rhonchi or rales.  Chest:     Chest wall: No tenderness.  Abdominal:     General: Bowel sounds are normal.     Palpations: Abdomen is soft.  Musculoskeletal:        General: No swelling, tenderness, deformity or signs of injury. Normal range of motion.     Right lower leg: No edema.      Left lower leg: No edema.  Skin:    General: Skin is warm and dry.     Capillary Refill: Capillary refill takes less than 2 seconds.     Coloration: Skin is not jaundiced or pale.     Findings: No bruising, erythema, lesion or rash.  Neurological:     General: No focal deficit present.     Mental Status: She is alert and oriented to person, place, and time. Mental status is at baseline.     Cranial Nerves: No cranial nerve deficit.     Sensory: No sensory deficit.     Motor: No weakness.     Coordination: Coordination normal.  Psychiatric:        Mood and Affect: Mood normal.        Behavior: Behavior normal.        Thought Content: Thought content normal.        Judgment: Judgment normal.      No results found for any visits on 04/18/22.  Assessment & Plan     Problem List Items Addressed This Visit       Respiratory   Moderate persistent asthma with exacerbation    Repeat exacerbation within 6 weeks Has been trying to stay indoors when air quality is low Has purifiers in her home Denies travel or sick contact No systemic symptoms Has not missed any medication Refer back to pulmonary team given worsening/more frequent attacks Repeat CXR today Repeat pred taper Increase abx coverage       Relevant Medications   predniSONE (DELTASONE) 10 MG tablet   levofloxacin (LEVAQUIN) 750 MG tablet   Other Relevant Orders   DG Chest 2 View   Ambulatory referral to Pulmonology     Return if symptoms worsen or fail to improve.      Vonna Kotyk, FNP, have reviewed all documentation for this visit. The documentation on 04/18/22 for the exam, diagnosis, procedures, and orders are all accurate and complete.    Gwyneth Sprout, [place] [phone] (phone) [phone] (fax)  [place]

## 2022-04-18 NOTE — Assessment & Plan Note (Signed)
Repeat exacerbation within 6 weeks Has been trying to stay indoors when air quality is low Has purifiers in her home Denies travel or sick contact No systemic symptoms Has not missed any medication Refer back to pulmonary team given worsening/more frequent attacks Repeat CXR today Repeat pred taper Increase abx coverage

## 2022-04-18 NOTE — Telephone Encounter (Signed)
    Chief Complaint: Ladell Heads Symptoms: Mild cough, using nebulizer Frequency: Has been going on for 1 month but getting worse Pertinent Negatives: Patient denies fever Disposition: '[]'$ ED /'[]'$ Urgent Care (no appt availability in office) / '[x]'$ Appointment(In office/virtual)/ '[]'$  [facility]/ '[]'$ Home Care/ '[]'$ Refused Recommended Disposition /'[]'$ [facility]/ '[]'$  Follow-up with PCP Additional Notes: Go to ED for worsening of symptoms.  Reason for Disposition  [1] MILD difficulty breathing (e.g., minimal/no SOB at rest, SOB with walking, pulse <100) AND [2] NEW-onset or WORSE than normal  Answer Assessment - Initial Assessment Questions 1. RESPIRATORY STATUS: "Describe your breathing?" (e.g., wheezing, shortness of breath, unable to speak, severe coughing)      Wheezing, tightness 2. ONSET: "When did this breathing problem begin?"      Last night 3. PATTERN "Does the difficult breathing come and go, or has it been constant since it started?"      Getting worse 4. SEVERITY: "How bad is your breathing?" (e.g., mild, moderate, severe)    - MILD: No SOB at rest, mild SOB with walking, speaks normally in sentences, can lie down, no retractions, pulse < 100.    - MODERATE: SOB at rest, SOB with minimal exertion and prefers to sit, cannot lie down flat, speaks in phrases, mild retractions, audible wheezing, pulse 100-120.    - SEVERE: Very SOB at rest, speaks in single words, struggling to breathe, sitting hunched forward, retractions, pulse > 120      Mild - moderate 5. RECURRENT SYMPTOM: "Have you had difficulty breathing before?" If Yes, ask: "When was the last time?" and "What happened that time?"      Yes 6. CARDIAC HISTORY: "Do you have any history of heart disease?" (e.g., heart attack, angina, bypass surgery, angioplasty)      No 7. LUNG HISTORY: "Do you have any history of lung disease?"  (e.g., pulmonary embolus, asthma, emphysema)     Asthma 8. CAUSE: "What do you  think is causing the breathing problem?"      Asthma 9. OTHER SYMPTOMS: "Do you have any other symptoms? (e.g., dizziness, runny nose, cough, chest pain, fever)     Cough 10. O2 SATURATION MONITOR:  "Do you use an oxygen saturation monitor (pulse oximeter) at home?" If Yes, "What is your reading (oxygen level) today?" "What is your usual oxygen saturation reading?" (e.g., 95%)       91 11. PREGNANCY: "Is there any chance you are pregnant?" "When was your last menstrual period?"       No 12. TRAVEL: "Have you traveled out of the country in the last month?" (e.g., travel history, exposures)       No  Protocols used: Breathing Difficulty-A-AH

## 2022-04-19 NOTE — Progress Notes (Signed)
CXR remains unchanged, which is reassuring. Please contact pulmonary to ensure the best care. Hope you continue to feel better.  Gwyneth Sprout, [address] [place], [place] [address] [phone] (phone) [phone] (fax) [place]

## 2022-05-13 ENCOUNTER — Encounter: Payer: Self-pay | Admitting: Family Medicine

## 2022-05-14 ENCOUNTER — Other Ambulatory Visit: Payer: Self-pay | Admitting: Family Medicine

## 2022-05-14 ENCOUNTER — Ambulatory Visit: Payer: Self-pay

## 2022-05-14 DIAGNOSIS — J4541 Moderate persistent asthma with (acute) exacerbation: Secondary | ICD-10-CM

## 2022-05-14 MED ORDER — PREDNISONE 10 MG PO TABS: ORAL_TABLET | ORAL | 0 refills | Status: DC

## 2022-05-14 NOTE — Telephone Encounter (Signed)
Pt called back to let us know that she spoke with Pulmonology office and they advised her they didn't have any open appts sooner but she could call back daily to see if any canceled appts open. Pt states she still working and unable to do that on a daily basis. Pt is asking if PCP will refill prednisone or not. Advised her that request has already been sent to provider and just waiting for a response and someone will fu with her.

## 2022-05-14 NOTE — Telephone Encounter (Signed)
Encounter opened in error please disregard

## 2022-05-14 NOTE — Telephone Encounter (Signed)
Pt stated she will call her pulmonologist now and will call back to let us know when she has an appt. Pt asked if PCP will refill the prednisone or wait. Pt stated she is unable to sleep last night.

## 2022-05-15 ENCOUNTER — Encounter: Payer: Self-pay | Admitting: Pulmonary Disease

## 2022-05-15 ENCOUNTER — Ambulatory Visit: Payer: BC Managed Care – PPO | Admitting: Pulmonary Disease

## 2022-05-15 ENCOUNTER — Other Ambulatory Visit
Admission: RE | Admit: 2022-05-15 | Discharge: 2022-05-15 | Disposition: A | Discharge status: Home / Self Care | Payer: BC Managed Care – PPO | Attending: Pulmonary Disease | Admitting: Pulmonary Disease

## 2022-05-15 VITALS — BP 126/74 | HR 66 | Temp 97.9°F | Ht 69.5 in | Wt 172.4 lb

## 2022-05-15 DIAGNOSIS — R0602 Shortness of breath: Secondary | ICD-10-CM | POA: Diagnosis not present

## 2022-05-15 DIAGNOSIS — J4551 Severe persistent asthma with (acute) exacerbation: Secondary | ICD-10-CM

## 2022-05-15 MED ORDER — TRELEGY ELLIPTA 200-62.5-25 MCG/ACT IN AEPB: 1.0000 | INHALATION_SPRAY | Freq: Every day | RESPIRATORY_TRACT | 0 refills | Status: DC

## 2022-05-15 NOTE — Progress Notes (Signed)
Subjective:    Patient ID: Angela Reyes, female    DOB: 09-Mar-1961, 61 y.o.   MRN: [id number] Patient Care Team: Gwyneth Sprout, FNP as PCP - General [facility])  Chief Complaint  Patient presents with   pulmonary consult    Hx of asthma. SOB with exertion and at rest, prod cough with clear sputum and wheezing x12mo Last course of prednisone 3 weeks ago.    HPI Patient is a 61year old lifelong never smoker who presents for evaluation of early control asthma.  She is kindly referred by ETally Joe FDodson  The patient notes that she was diagnosed with "adult onset" asthma 13 years ago.  [facility], she was never approved by insurance for the procedure.  She had pulmonary function testing performed in January 2015 that demonstrated positive methacholine response.  She has also undergone testing with exhaled nitric oxide and has been noted to have markedly elevated nitric oxide in the past.  She has had prior allergy evaluation by Dr. MHardie Pulleyapproximately 7 years ago with no specific allergen identified.  She has been noted to have eosinophilia in the past but none recently.  In the past also noted to have sinus disease and sinus surgery recommended however with the advent of COVID 19 this was postponed.  She also has had issues with gastroesophageal reflux but these are now well controlled with PPI.  She was previously evaluated by Dr. RChase Callerin 2014 and 2015 and by Dr. RAshby Dawesin 2017.  She had a sinus CT in 2014 showing sinus disease as well as nasal polyposis.  CT chest high-resolution in 2014 was consistent with air trapping benign appearing nodules and areas of mucoid impaction consistent with asthma.  Repeat chest CT in 2017 showed no concerning abnormality with the lung nodules being stable.  In looking at her records she really has not had very good  control despite using LABA LAMA combination and leukotriene inhibitors.  She does have frequent exacerbations that have become more prominent particularly over the last 3 months.  She responds well to steroids but as soon as she finishes the steroid taper she recurs in her symptoms.  She does use albuterol several times per day due to shortness of breath.  She has a dog in the home.  Previously had a cat however she no longer has this animal in the home.  She started noticing increasing shortness of breath 2 to 3 days ago.  She has a prednisone taper that has been called into her pharmacy and she is to pick it up today.  She has not had any sputum discoloration sputum is mostly whitish with occasional plugs.  No hemoptysis.  She does note chest tightness but no chest pain per se.  No orthopnea or paroxysmal nocturnal dyspnea.  No lower extremity edema and no calf tenderness.  No fevers, chills or sweats.   Review of Systems A 10 point review of systems was performed and it is as noted above otherwise negative.  Past Medical History:  Diagnosis Date   Asthma    Chronic rhinosinusitis    GERD (gastroesophageal reflux disease)    History of dysplastic nevus 04/26/2010   left mid to upper back, mod to severe/excised 06/13/10   History of dysplastic nevus 01/07/2018   right mid medial dorsum scapula, severe   History of gestational diabetes  History of hiatal hernia    OSA (obstructive sleep apnea)    non-compliant cpap   Pulmonary nodule, right    middle lobe-- stable low risk--  pulmologist,  dr Chase Caller   Right ureteral stone    Past Surgical History:  Procedure Laterality Date   CYSTOSCOPY W/ URETERAL STENT PLACEMENT Right 06/07/2015   Procedure: CYSTOSCOPY WITH RETROGRADE PYELOGRAM/ STONE EXTRACTION AND URETERAL STENT PLACEMENT;  Surgeon: Cleon Gustin, MD;  Location: [facility];  Service: Urology;  Laterality: Right;   EXTRACORPOREAL SHOCK WAVE LITHOTRIPSY Right  05-15-2015   HOLMIUM LASER APPLICATION Right 2/59/5638   Procedure: HOLMIUM LASER APPLICATION;  Surgeon: Cleon Gustin, MD;  Location: [facility];  Service: Urology;  Laterality: Right;   TONSILLECTOMY  age 89   WISDOM TOOTH EXTRACTION  age 10   Patient Active Problem List   Diagnosis Date Noted   Moderate persistent asthma with exacerbation 12/04/2021   Pain around toenail, left foot 09/03/2021   Acute right-sided low back pain without sciatica 09/03/2021   Toenail fungus 09/03/2021   Allergic rhinitis 05/01/2015   Absolute anemia 03/06/2015   At risk for osteoporosis 03/06/2015   Long term current use of systemic steroids 03/06/2015   Acid reflux 03/06/2015   External hemorrhoid 03/06/2015   Plantar fasciitis 03/06/2015   Severe persistent asthma 01/27/2014   Lung nodule 10/14/2013   Chronic cough 09/12/2013   Asthma, cough variant 06/30/2009   Fatty tumor 12/08/2008   Adiposity 12/06/2008   Anxiety 11/17/2008   Adaptive colitis 11/17/2008   LBP (low back pain) 11/17/2008   Family History  Problem Relation Age of Onset   Hypertension Mother    Hypertension Father    Kidney disease Brother    Hypertension Maternal Grandmother    Cancer Paternal Grandfather    Allergies Other        siblings   Breast cancer Neg Hx    Social History   Tobacco Use   Smoking status: Never   Smokeless tobacco: Never  Substance Use Topics   Alcohol use: No   No Known Allergies  Current Meds  Medication Sig   albuterol (PROVENTIL) (2.5 MG/3ML) 0.083% nebulizer solution TAKE 3 MLS BY NEBULIZATION EVERY 6 HOURS AS NEEDED FOR WHEEZING OR SHORTNESS OF BREATH.   albuterol (VENTOLIN HFA) 108 (90 Base) MCG/ACT inhaler TAKE 2 PUFFS BY MOUTH EVERY 6 HOURS AS NEEDED FOR WHEEZE OR SHORTNESS OF BREATH   cetirizine (ZYRTEC) 10 MG tablet Take 10 mg by mouth at bedtime. Reported on 05/01/2016   conjugated estrogens (PREMARIN) vaginal cream Place 1 Applicatorful vaginally daily.    esomeprazole (NEXIUM) 20 MG capsule Take 20 mg by mouth daily at 12 noon.   Fluticasone-Umeclidin-Vilant (TRELEGY ELLIPTA) 200-62.5-25 MCG/ACT AEPB Inhale 1 puff into the lungs daily.   ipratropium (ATROVENT) 0.03 % nasal spray Place 2 sprays into both nostrils every 12 (twelve) hours.   montelukast (SINGULAIR) 10 MG tablet TAKE 1 TABLET BY MOUTH EVERYDAY AT BEDTIME   predniSONE (DELTASONE) 10 MG tablet Day 1 take 6 tablets Day 2 take 6 tablets Day 3 take 5 tablets Day 4 take 5 tablets Day 5 take 4 tablets Day 6 take 4 tablets Day 7 take 3 tablets Day 8 take 3 tablets Day 9 take 2 tablets Day 10 take 2 tablets Day 11 take 1 tablet Day 12 take 1 tablet Day 13 take 1/2 tablet Day 14 take 1/2 tablet   [DISCONTINUED] fluticasone furoate-vilanterol (BREO ELLIPTA) 200-25 MCG/ACT  AEPB INHALE 1 PUFF INTO THE LUNGS EVERY EVENING.   [DISCONTINUED] Fluticasone Propionate 93 MCG/ACT EXHU Place into the nose.   [DISCONTINUED] levofloxacin (LEVAQUIN) 750 MG tablet Take 1 tablet (750 mg total) by mouth daily.   Immunization History  Administered Date(s) Administered   Influenza,inj,Quad PF,6+ Mos 11/21/2014, 07/23/2017   Pneumococcal Polysaccharide-23 08/21/2012, 03/03/2013   Tdap 11/21/2014       Objective:   Physical Exam BP 126/74 (BP Location: Left Arm, Cuff Size: Normal)   Pulse 66   Temp 97.9 F (36.6 C) (Temporal)   Ht 5' 9.5" (1.765 m)   Wt 172 lb 6.4 oz (78.2 kg)   LMP 02/10/2015 Comment: 1st one since dec 2014  SpO2 97%   BMI 25.09 kg/m  GENERAL: Well-developed, well-nourished woman, no acute distress.  Mildly tachypneic but no conversational dyspnea.  Nasal quality to speech. HEAD: Normocephalic, atraumatic.  EYES: Pupils equal, round, reactive to light.  No scleral icterus.  MOUTH: Dentition intact, oral mucosa moist. NECK: Supple. No thyromegaly. Trachea midline. No JVD.  No adenopathy. PULMONARY: Good air entry bilaterally.  Diffuse wheezes throughout noted. CARDIOVASCULAR: S1 and  S2. Regular rate and rhythm.  No rubs, murmurs or gallops heard. ABDOMEN: Benign. MUSCULOSKELETAL: No joint deformity, no clubbing, no edema.  NEUROLOGIC: No overt focal deficit, no gait disturbance, speech is fluent. SKIN: Intact,warm,dry. PSYCH: Mildly anxious, behavior normal.       Assessment & Plan:     ICD-10-CM   1. Severe persistent asthma with acute exacerbation  J45.51    Go ahead and get prednisone taper as prescribed by primary Trial of Trelegy Ellipta 200, hold Breo while on Trelegy PFTs Allergen panel    2. Shortness of breath  R06.02 Allergen Panel (27) + IGE    Pulmonary Function Test [facility] Only    Aspergillus IgE Panel   Likely due to poorly compensated asthma Prednisone taper as prescribed Trial of Trelegy Ellipta     Orders Placed This Encounter  Procedures   Allergen Panel (27) + IGE    Standing Status:   Future    Number of Occurrences:   1    Standing Expiration Date:   05/16/2023   Aspergillus IgE Panel    Standing Status:   Future    Number of Occurrences:   1    Standing Expiration Date:   05/16/2023   Pulmonary Function Test [facility] Only    Next available    Standing Status:   Future    Standing Expiration Date:   05/16/2023    Order Specific Question:   Full PFT: includes the following: basic spirometry, spirometry pre & post bronchodilator, diffusion capacity (DLCO), lung volumes    Answer:   Full PFT   Meds ordered this encounter  Medications   Fluticasone-Umeclidin-Vilant (TRELEGY ELLIPTA) 200-62.5-25 MCG/ACT AEPB    Sig: Inhale 1 puff into the lungs daily.    Dispense:  14 each    Refill:  0    Order Specific Question:   Lot Number?    Answer:   [id number]    Order Specific Question:   Expiration Date?    Answer:   10/22/2023    Order Specific Question:   Manufacturer?    Answer:   [employer] [[id number]]    Order Specific Question:   Quantity    Answer:   2   Patient will likely qualify for biologic for management of severe persistent asthma.   We are initiating work-up for the same.  Patient  was samples of Trelegy Ellipta to see if this will help her more than current Breo Ellipta.  She was instructed to go ahead and fill her prednisone prescription given her by her primary provider as she does have bronchospasm noted today.  We will see the patient in follow-up in 4 to 6 weeks time however she is to contact us prior to that time should any new difficulties arise.   Renold Don, MD Advanced Bronchoscopy PCCM [facility]    *This note was dictated using voice recognition software/Dragon.  Despite best efforts to proofread, errors can occur which can change the meaning. Any transcriptional errors that result from this process are unintentional and may not be fully corrected at the time of dictation.

## 2022-05-15 NOTE — Patient Instructions (Signed)
We are giving you a trial of Trelegy Ellipta 200 this will replace the [facility] you are currently on.  Let us know how the Trelegy Ellipta at this for you so we can call it into your pharmacy.  Do not use the Breo while you are on the Trelegy  Go ahead and get the prednisone taper your primary care provider ordered for you.  We are ordering some blood work to help [place] determine if a biologic (injectable) is indicated for you.  We are ordering breathing tests.  We will see you in follow-up in 4 to 6 weeks time call sooner should any new problems arise.

## 2022-05-18 LAB — ALLERGEN PANEL (27) + IGE
Alternaria Alternata IgE: <0.1 kU/L
Aspergillus Fumigatus IgE: <0.1 kU/L
Bahia Grass IgE: <0.1 kU/L
Bermuda Grass IgE: <0.1 kU/L
Cat Dander IgE: <0.1 kU/L
Cedar, Mountain IgE: <0.1 kU/L
Cladosporium Herbarum IgE: <0.1 kU/L
Cocklebur IgE: <0.1 kU/L
Cockroach, American IgE: <0.1 kU/L
Common Silver Birch IgE: <0.1 kU/L
D Farinae IgE: <0.1 kU/L
D Pteronyssinus IgE: <0.1 kU/L
Dog Dander IgE: <0.1 kU/L
Elm, American IgE: <0.1 kU/L
Hickory, White IgE: <0.1 kU/L
IgE (Immunoglobulin E), Serum: 5 IU/mL — ABNORMAL LOW (ref 6–495)
Johnson Grass IgE: <0.1 kU/L
Kentucky Bluegrass IgE: <0.1 kU/L
Maple/Box Elder IgE: <0.1 kU/L
Mucor Racemosus IgE: <0.1 kU/L
Oak, White IgE: <0.1 kU/L
Penicillium Chrysogen IgE: <0.1 kU/L
Pigweed, Rough IgE: <0.1 kU/L
Plantain, English IgE: <0.1 kU/L
Ragweed, Short IgE: <0.1 kU/L
Setomelanomma Rostrat: <0.1 kU/L
Timothy Grass IgE: <0.1 kU/L
White Mulberry IgE: <0.1 kU/L

## 2022-05-18 LAB — MISC LABCORP TEST (SEND OUT): Labcorp test code: 602471

## 2022-06-20 NOTE — Telephone Encounter (Signed)
Hi Angela Reyes,  Dr. Patsey Berthold wants you to let us know how the Trelegy works for you and we will send a Rx to your pharmacy.  I will forward this message to our scheduler about the appt time on 10/25

## 2022-07-02 ENCOUNTER — Ambulatory Visit: Payer: BC Managed Care – PPO

## 2022-07-04 ENCOUNTER — Ambulatory Visit: Payer: BC Managed Care – PPO | Admitting: Pulmonary Disease

## 2022-07-04 ENCOUNTER — Institutional Professional Consult (permissible substitution): Payer: BC Managed Care – PPO | Admitting: Pulmonary Disease

## 2022-07-16 ENCOUNTER — Telehealth: Payer: Self-pay | Admitting: Pulmonary Disease

## 2022-07-16 MED ORDER — AZITHROMYCIN 250 MG PO TABS: ORAL_TABLET | ORAL | 0 refills | Status: AC

## 2022-07-16 MED ORDER — TRELEGY ELLIPTA 200-62.5-25 MCG/ACT IN AEPB: 1.0000 | INHALATION_SPRAY | Freq: Every day | RESPIRATORY_TRACT | 11 refills | Status: DC

## 2022-07-16 MED ORDER — METHYLPREDNISOLONE 4 MG PO TBPK: ORAL_TABLET | ORAL | 0 refills | Status: DC

## 2022-07-16 NOTE — Telephone Encounter (Signed)
Patient is aware of below message/recommendations and voiced her understanding.  Trelegy, medrol and zpak has been sent to preferred pharmacy.  Patient is questioning if she should rescheduled PFt that is scheduled for 07/17/2022.   Dr. Patsey Berthold, please advise. Thanks

## 2022-07-16 NOTE — Telephone Encounter (Signed)
Called and spoke pt.  Patient feels that she has developed an asthma flare.  C/o non prod cough, wheezing, chest congestion and increased SOBx1w. Albuterol solution around the clock every 2-3hr. She completed sample of trelegy. She resumed Breo, as a Rx of trelegy was not called in. No recent covid test. She will take a test and call back with results. Denied f/c/s or additional sx.   Dr. Patsey Berthold, please advise.

## 2022-07-16 NOTE — Telephone Encounter (Signed)
Patient is aware of below message and voiced her understanding.   Rodena Piety, please reschedule PFT.

## 2022-07-16 NOTE — Telephone Encounter (Signed)
Lets call her in the Trelegy 200, discontinue Breo.  Make sure she rinses her mouth well after she uses the Trelegy.  Also call in a Medrol Dosepak and Azithromycin Z-Pak.

## 2022-07-16 NOTE — Telephone Encounter (Signed)
Yes PFTs will need to be rescheduled.  Not good to do while she is on an exacerbation.

## 2022-07-16 NOTE — Telephone Encounter (Signed)
I have left a message asking Angela Reyes to CXL this patient PFT appt for tomorrow. We will need to wait until we get more PFT appts before we can get her rescheduled

## 2022-07-17 ENCOUNTER — Ambulatory Visit: Payer: BC Managed Care – PPO

## 2022-07-22 ENCOUNTER — Encounter: Payer: Self-pay | Admitting: Pulmonary Disease

## 2022-07-22 NOTE — Telephone Encounter (Signed)
She needs to be seen as an acute visit or at urgent care.  She will need imaging.  She has failed outpatient therapy so additional work-up may be necessary.

## 2022-07-22 NOTE — Telephone Encounter (Signed)
Dr. Gonzalez, please advise. Thanks 

## 2022-07-23 ENCOUNTER — Encounter: Payer: Self-pay | Admitting: Pulmonary Disease

## 2022-07-23 ENCOUNTER — Ambulatory Visit
Admission: RE | Admit: 2022-07-23 | Discharge: 2022-07-23 | Disposition: A | Discharge status: Home / Self Care | Payer: BC Managed Care – PPO | Attending: Pulmonary Disease | Admitting: Pulmonary Disease

## 2022-07-23 ENCOUNTER — Ambulatory Visit: Payer: BC Managed Care – PPO | Admitting: Pulmonary Disease

## 2022-07-23 ENCOUNTER — Ambulatory Visit
Admission: RE | Admit: 2022-07-23 | Discharge: 2022-07-23 | Disposition: A | Discharge status: Home / Self Care | Payer: BC Managed Care – PPO | Source: Ambulatory Visit | Attending: Pulmonary Disease | Admitting: Pulmonary Disease

## 2022-07-23 ENCOUNTER — Other Ambulatory Visit
Admission: RE | Admit: 2022-07-23 | Discharge: 2022-07-23 | Disposition: A | Discharge status: Home / Self Care | Payer: BC Managed Care – PPO | Source: Home / Self Care | Attending: Pulmonary Disease | Admitting: Pulmonary Disease

## 2022-07-23 VITALS — BP 132/80 | HR 75 | Temp 97.9°F | Ht 69.5 in | Wt 172.6 lb

## 2022-07-23 DIAGNOSIS — J4551 Severe persistent asthma with (acute) exacerbation: Secondary | ICD-10-CM

## 2022-07-23 DIAGNOSIS — R0602 Shortness of breath: Secondary | ICD-10-CM | POA: Diagnosis not present

## 2022-07-23 DIAGNOSIS — R768 Other specified abnormal immunological findings in serum: Secondary | ICD-10-CM | POA: Diagnosis not present

## 2022-07-23 DIAGNOSIS — J45909 Unspecified asthma, uncomplicated: Secondary | ICD-10-CM | POA: Diagnosis not present

## 2022-07-23 LAB — CBC WITH DIFFERENTIAL/PLATELET
Abs Immature Granulocytes: 0.02 10*3/uL (ref 0.00–0.07)
Basophils Absolute: 0.1 10*3/uL (ref 0.0–0.1)
Basophils Relative: 2 %
Eosinophils Absolute: 1.5 10*3/uL — ABNORMAL HIGH (ref 0.0–0.5)
Eosinophils Relative: 21 %
HCT: 46.8 % — ABNORMAL HIGH (ref 36.0–46.0)
Hemoglobin: 15.1 g/dL — ABNORMAL HIGH (ref 12.0–15.0)
Immature Granulocytes: 0 %
Lymphocytes Relative: 27 %
Lymphs Abs: 1.9 10*3/uL (ref 0.7–4.0)
MCH: 28.9 pg (ref 26.0–34.0)
MCHC: 32.3 g/dL (ref 30.0–36.0)
MCV: 89.5 fL (ref 80.0–100.0)
Monocytes Absolute: 0.9 10*3/uL (ref 0.1–1.0)
Monocytes Relative: 14 %
Neutro Abs: 2.5 10*3/uL (ref 1.7–7.7)
Neutrophils Relative %: 36 %
Platelets: 314 10*3/uL (ref 150–400)
RBC: 5.23 MIL/uL — ABNORMAL HIGH (ref 3.87–5.11)
RDW: 13.2 % (ref 11.5–15.5)
WBC: 6.9 10*3/uL (ref 4.0–10.5)
nRBC: 0 % (ref 0.0–0.2)

## 2022-07-23 LAB — NITRIC OXIDE: Nitric Oxide: 220

## 2022-07-23 MED ORDER — PREDNISONE 20 MG PO TABS: ORAL_TABLET | ORAL | 0 refills | Status: DC

## 2022-07-23 NOTE — Telephone Encounter (Signed)
I have spoke with Angela Reyes and her PFT has been rescheduled for 08/13/22 @ 1:00pm and she is aware

## 2022-07-23 NOTE — Patient Instructions (Signed)
Continue taking your Trelegy 1 puff daily.  Remember to rinse your mouth well after you use it.  I will call you the results of your chest x-ray in case there is need for antibiotic.  We also are getting a blood test just to make sure there is no active infection and also to check your immune system.  Sent prednisone prescription to your pharmacy.  Keep your appointment with me on 10/25.  To call in the interim should there be any new issues that develop or if you fail to improve within the next few days.

## 2022-07-23 NOTE — Progress Notes (Signed)
Subjective:    Patient ID: Angela Reyes, female    DOB: 06/01/1961, 61 y.o.   MRN: [id number] Patient Care Team: Gwyneth Sprout, FNP as PCP - General [facility])  Chief Complaint  Patient presents with   Follow-up    Severe persistent asthma with acute exacerbation. Coughing up green sputum.   HPI Patient is a 62 year old lifelong never smoker who presents for follow-up on the issue of asthma.  She was initially evaluated by me on 15 May 2022.  At that time she was given a trial of Trelegy Ellipta 200 and allergen panel sent Aspergillus panels were sent.  For the details of her initial visit please refer to that note.  At that time she was given a prednisone taper by her primary care physician.  She notes that every time she finishes steroids she starts having difficulties with her asthma again this despite being on LABA ICS LAMA combination and leukotriene inhibitors.  Her allergen panel obtained on 26 July was negative.  Her IgE was low which could indicate immunodeficiency.  Since her prior visit she has noted increasing shortness of breath once steroids wear off.  She however feels that Trelegy is working better for her.  She however started this medication only a few days ago (9/27).  She also had a Medrol Dosepak and Azithromycin Z-Pak called in on 9/26 due to symptoms of exacerbation.  She notes that she has to use albuterol more at nighttime.  She presents today stating that she has cough with green sputum production.  No hemoptysis.  She has been afebrile.  The cough however is improving on the Medrol Dosepak and Azithromycin Z-Pak.  No fevers, chills or sweats.  Chest tightness but no true chest pain.   Review of Systems A 10 point review of systems was performed and it is as noted above otherwise negative.  Patient Active Problem List   Diagnosis Date Noted   Moderate persistent asthma with exacerbation 12/04/2021   Pain around toenail, left foot 09/03/2021   Acute  right-sided low back pain without sciatica 09/03/2021   Toenail fungus 09/03/2021   Allergic rhinitis 05/01/2015   Absolute anemia 03/06/2015   At risk for osteoporosis 03/06/2015   Long term current use of systemic steroids 03/06/2015   Acid reflux 03/06/2015   External hemorrhoid 03/06/2015   Plantar fasciitis 03/06/2015   Severe persistent asthma 01/27/2014   Lung nodule 10/14/2013   Chronic cough 09/12/2013   Asthma, cough variant 06/30/2009   Fatty tumor 12/08/2008   Adiposity 12/06/2008   Anxiety 11/17/2008   Adaptive colitis 11/17/2008   LBP (low back pain) 11/17/2008   Social History   Tobacco Use   Smoking status: Never   Smokeless tobacco: Never  Substance Use Topics   Alcohol use: No   No Known Allergies  Current Meds  Medication Sig   albuterol (PROVENTIL) (2.5 MG/3ML) 0.083% nebulizer solution TAKE 3 MLS BY NEBULIZATION EVERY 6 HOURS AS NEEDED FOR WHEEZING OR SHORTNESS OF BREATH.   albuterol (VENTOLIN HFA) 108 (90 Base) MCG/ACT inhaler TAKE 2 PUFFS BY MOUTH EVERY 6 HOURS AS NEEDED FOR WHEEZE OR SHORTNESS OF BREATH   cetirizine (ZYRTEC) 10 MG tablet Take 10 mg by mouth at bedtime. Reported on 05/01/2016   conjugated estrogens (PREMARIN) vaginal cream Place 1 Applicatorful vaginally daily.   esomeprazole (NEXIUM) 20 MG capsule Take 20 mg by mouth daily at 12 noon.   Fluticasone-Umeclidin-Vilant (TRELEGY ELLIPTA) 200-62.5-25 MCG/ACT AEPB Inhale 1 puff into  the lungs daily.   ipratropium (ATROVENT) 0.03 % nasal spray Place 2 sprays into both nostrils every 12 (twelve) hours.   montelukast (SINGULAIR) 10 MG tablet TAKE 1 TABLET BY MOUTH EVERYDAY AT BEDTIME   Immunization History  Administered Date(s) Administered   Influenza,inj,Quad PF,6+ Mos 11/21/2014, 07/23/2017   Pneumococcal Polysaccharide-23 08/21/2012, 03/03/2013   Tdap 11/21/2014       Objective:   Physical Exam BP 132/80 (BP Location: Left Arm, Cuff Size: Normal)   Pulse 75   Temp 97.9 F (36.6 C)    Ht 5' 9.5" (1.765 m)   Wt 172 lb 9.6 oz (78.3 kg)   LMP 02/10/2015 Comment: 1st one since dec 2014  SpO2 96%   BMI 25.12 kg/m  GENERAL: Well-developed, well-nourished woman, no acute distress.  Mildly tachypneic but no conversational dyspnea.  Nasal quality to speech.  Looks acutely ill but nontoxic. HEAD: Normocephalic, atraumatic.  EYES: Pupils equal, round, reactive to light.  No scleral icterus.  MOUTH: Dentition intact, oral mucosa moist.  No thrush. NECK: Supple. No thyromegaly. Trachea midline. No JVD.  No adenopathy. PULMONARY: Good air entry bilaterally.  Diffuse wheezes throughout noted, few rhonchi on the left lower lung field. CARDIOVASCULAR: S1 and S2. Regular rate and rhythm.  No rubs, murmurs or gallops heard. ABDOMEN: Benign. MUSCULOSKELETAL: No joint deformity, no clubbing, no edema.  NEUROLOGIC: No overt focal deficit, no gait disturbance, speech is fluent. SKIN: Intact,warm,dry. PSYCH: Mildly anxious, behavior normal.     Lab Results  Component Value Date   NITRICOXIDE 220 07/23/2022  High FeNO value consistent with type II airway inflammation present     Assessment & Plan:     ICD-10-CM   1. Severe persistent asthma with (acute) exacerbation  J45.51 Nitric oxide    DG Chest 2 View    CBC with Differential/Platelet    Immunoglobulins, QN, A/E/G/M   Patient has an acute exacerbation Prednisone 40 mg daily x3 days then 20 mg daily until seen again CXR to ensure no infiltrates present    2. Low serum IgE  R76.8    This may indicate other immune deficiency Check immunoglobulins     Orders Placed This Encounter  Procedures   DG Chest 2 View    Standing Status:   Future    Standing Expiration Date:   07/24/2023    Order Specific Question:   Reason for Exam (SYMPTOM  OR DIAGNOSIS REQUIRED)    Answer:   sob    Order Specific Question:   Is patient pregnant?    Answer:   No    Order Specific Question:   Preferred imaging location?    Answer:   [facility]   CBC with Differential/Platelet    Standing Status:   Future    Standing Expiration Date:   07/24/2023   Immunoglobulins, QN, A/E/G/M    Standing Status:   Future    Standing Expiration Date:   07/24/2023   Nitric oxide   Meds ordered this encounter  Medications   predniSONE (DELTASONE) 20 MG tablet    Sig: Take 2 tablets the first 3 days then 1 tablet daily until seen again by the physician.    Dispense:  30 tablet    Refill:  0   Patient has follow-up appointment with me on 10/25.  She was advised to keep that appointment.  We will notify her of the results of the chest x-ray and if necessary send a prescription for an antibiotic.  Checking CBC  with differential to check for eosinophilia and immunoglobulins given that very low IgE was noted previously, this may be an indication of other immune deficiency.  She is to contact us if she is not improving with the prednisone.  If her immunoglobulins are not depressed she will be a candidate for Dupixent.   Renold Don, MD Advanced Bronchoscopy PCCM [place] [facility]    *This note was dictated using voice recognition software/Dragon.  Despite best efforts to proofread, errors can occur which can change the meaning. Any transcriptional errors that result from this process are unintentional and may not be fully corrected at the time of dictation.

## 2022-07-25 LAB — IMMUNOGLOBULINS A/E/G/M, SERUM
IgA: 221 mg/dL (ref 87–352)
IgE (Immunoglobulin E), Serum: 7 IU/mL (ref 6–495)
IgG (Immunoglobin G), Serum: 889 mg/dL (ref 586–1602)
IgM (Immunoglobulin M), Srm: 147 mg/dL (ref 26–217)

## 2022-07-26 NOTE — Progress Notes (Addendum)
I notified the patient of her results. She did not have any questions. We will see her at her next appointment on 08/14/2022.

## 2022-07-29 NOTE — Progress Notes (Signed)
Angela Reyes, [place]  Certified Medical Assistant Progress Notes   Addendum Date of Service:  07/23/2022 2:40 PM     I notified the patient of her results. She did not have any questions. We will see her at her next appointment on 08/14/2022.  Electronically signed by Angela Reyes, CMA at 07/26/2022 4:48 PM

## 2022-08-13 ENCOUNTER — Ambulatory Visit: Payer: BC Managed Care – PPO | Attending: Pulmonary Disease

## 2022-08-13 DIAGNOSIS — R0602 Shortness of breath: Secondary | ICD-10-CM | POA: Insufficient documentation

## 2022-08-13 LAB — PULMONARY FUNCTION TEST ARMC ONLY
DL/VA % pred: 114 %
DL/VA: 4.65 ml/min/mmHg/L
DLCO unc % pred: 103 %
DLCO unc: 24.67 ml/min/mmHg
FEF 25-75 Post: 2.76 L/sec
FEF 25-75 Pre: 2.58 L/sec
FEF2575-%Change-Post: 6 %
FEF2575-%Pred-Post: 102 %
FEF2575-%Pred-Pre: 96 %
FEV1-%Change-Post: 1 %
FEV1-%Pred-Post: 93 %
FEV1-%Pred-Pre: 92 %
FEV1-Post: 2.89 L
FEV1-Pre: 2.85 L
FEV1FVC-%Change-Post: 0 %
FEV1FVC-%Pred-Pre: 98 %
FEV6-%Change-Post: 0 %
FEV6-%Pred-Post: 96 %
FEV6-%Pred-Pre: 95 %
FEV6-Post: 3.71 L
FEV6-Pre: 3.68 L
FEV6FVC-%Pred-Post: 103 %
FEV6FVC-%Pred-Pre: 103 %
FVC-%Change-Post: 0 %
FVC-%Pred-Post: 93 %
FVC-%Pred-Pre: 92 %
FVC-Post: 3.71 L
FVC-Pre: 3.68 L
Post FEV1/FVC ratio: 78 %
Post FEV6/FVC ratio: 100 %
Pre FEV1/FVC ratio: 77 %
Pre FEV6/FVC Ratio: 100 %
RV % pred: 69 %
RV: 1.53 L
TLC % pred: 89 %
TLC: 5.21 L

## 2022-08-14 ENCOUNTER — Encounter: Payer: Self-pay | Admitting: Pulmonary Disease

## 2022-08-14 ENCOUNTER — Telehealth: Payer: Self-pay

## 2022-08-14 ENCOUNTER — Ambulatory Visit: Payer: BC Managed Care – PPO | Admitting: Pulmonary Disease

## 2022-08-14 ENCOUNTER — Telehealth: Payer: Self-pay | Admitting: Pharmacist

## 2022-08-14 VITALS — BP 118/80 | HR 62 | Temp 98.1°F | Ht 69.5 in | Wt 178.0 lb

## 2022-08-14 DIAGNOSIS — J4550 Severe persistent asthma, uncomplicated: Secondary | ICD-10-CM

## 2022-08-14 DIAGNOSIS — D721 Eosinophilia, unspecified: Secondary | ICD-10-CM | POA: Diagnosis not present

## 2022-08-14 DIAGNOSIS — J4551 Severe persistent asthma with (acute) exacerbation: Secondary | ICD-10-CM

## 2022-08-14 DIAGNOSIS — Z7952 Long term (current) use of systemic steroids: Secondary | ICD-10-CM

## 2022-08-14 LAB — NITRIC OXIDE: Nitric Oxide: 126

## 2022-08-14 MED ORDER — BREO ELLIPTA 200-25 MCG/ACT IN AEPB: 1.0000 | INHALATION_SPRAY | Freq: Every day | RESPIRATORY_TRACT | 11 refills | Status: DC

## 2022-08-14 MED ORDER — PREDNISONE 10 MG PO TABS: 20.0000 mg | ORAL_TABLET | Freq: Every day | ORAL | 1 refills | Status: DC

## 2022-08-14 MED ORDER — INCRUSE ELLIPTA 62.5 MCG/ACT IN AEPB: 1.0000 | INHALATION_SPRAY | Freq: Every day | RESPIRATORY_TRACT | 6 refills | Status: DC

## 2022-08-14 NOTE — Telephone Encounter (Signed)
We are unable to determine this until a medication is started. Patient should qualify for either with her elevated eosinophils. Will reach out to patient to determine what dosing schedule she would prefer  Knox Saliva, PharmD, MPH, BCPS, CPP Clinical Pharmacist (Rheumatology and Pulmonology)

## 2022-08-14 NOTE — Telephone Encounter (Signed)
Spoke with patient - reviewed difference in dosing regimen, mechanism, and side effects with Fasenra vs Dupixent. She states she'd prefer to try for Berna Bue first. Advised that if insurance denies for Berna Bue, we may have to try [place] first. She verbalized understanding.  She states that she was primarily concerned about cost but advised that regardless of what biologic she starts, she will be enrolled into copay assistance to help bring copay down to$5 or less per month. She verbalized understanding.  Will start Fasenra BIV in separate telephone encounter.  Knox Saliva, PharmD, MPH, BCPS, CPP Clinical Pharmacist (Rheumatology and Pulmonology)

## 2022-08-14 NOTE — Telephone Encounter (Addendum)
Dr. Patsey Berthold is wanting to start this patient on Fasenra or Dupixent can you find out which one her insurance will pay for? Thank you!

## 2022-08-14 NOTE — Telephone Encounter (Signed)
Please start [employer] pending OV note to be signed  Dose: '30mg'$  SQ at Week 0, Week 4, Week 8 ,then every 8 weeks thereafter  Dx: Severe persistent asthma (J45.50)  Abs eos of 1500 on 07/23/22  Knox Saliva, PharmD, MPH, BCPS, CPP Clinical Pharmacist (Rheumatology and Pulmonology)

## 2022-08-14 NOTE — Patient Instructions (Signed)
We have sent a prescription for prednisone to your pharmacy.  These will be 10 mg tablets.  You will take 2 tablets daily until we direct you otherwise.  This will help you taper off once we start that process.  We have sent a prescription of Breo Ellipta and Incruse Ellipta to your pharmacy.  These 2 components make up Trelegy but appear to be better covered by her prescription plan.  Make sure you rinse your mouth well after you use the Breo in particular.  Both of these medications are only once a day.  We are sending a test claim to see which of the injectable medications are better covered under your plan.  Likely Fasenra or Dupixent.  On your first visit you will have to go to [facility] to get trained on the medication but after that you can give those yourself at home.  We will be monitoring your levels of inflammation on follow-up visits.  See you in follow-up in 4 to 6 weeks time call sooner should any new problems arise.

## 2022-08-14 NOTE — Progress Notes (Deleted)
   Subjective:    Patient ID: Angela Reyes, female    DOB: [birth year], 61 y.o.   MRN: [id number]  HPI    Review of Systems     Objective:   Physical Exam        Assessment & Plan:

## 2022-08-14 NOTE — Progress Notes (Signed)
Subjective:    Patient ID: Angela Reyes, female    DOB: 1961/02/18, 61 y.o.   MRN: [id number] Patient Care Team: Gwyneth Sprout, FNP as PCP - General [facility])  Chief Complaint  Patient presents with   Follow-up    Doing Good. On Prednisone. No SOB, wheezing or cough.    HPI Is a 61 year old lifelong never smoker with severe persistent asthma prednisone dependent, who presents for follow-up from her visit of 23 July 2022.  Since that visit the patient notes that her breathing is better however she is currently on 20 mg of prednisone daily.  She had been switched to Trelegy Ellipta 200 however, her insurance does not cover it even with payment coupon and it is costing her over $500.  She has been however, compliant with it.  She is also on Singulair 10 mg daily.  Uses albuterol mostly during exacerbations.  Since she has been on prednisone has not required use of albuterol.  Patient has not had any fevers, chills or sweats.  She recovered from her exacerbation from 3 October without sequela.  Today she states it is "a good day".  Laboratory data obtained on 3 October shows that she had absolute eosinophilia, her immunoglobulin profile was normal.  Chest x-ray obtained on 3 October showed no acute cardiopulmonary disease.  Review of Systems A 10 point review of systems was performed and it is as noted above otherwise negative.  Patient Active Problem List   Diagnosis Date Noted   Moderate persistent asthma with exacerbation 12/04/2021   Pain around toenail, left foot 09/03/2021   Acute right-sided low back pain without sciatica 09/03/2021   Toenail fungus 09/03/2021   Allergic rhinitis 05/01/2015   Absolute anemia 03/06/2015   At risk for osteoporosis 03/06/2015   Long term current use of systemic steroids 03/06/2015   Acid reflux 03/06/2015   External hemorrhoid 03/06/2015   Plantar fasciitis 03/06/2015   Severe persistent asthma 01/27/2014   Lung nodule 10/14/2013    Chronic cough 09/12/2013   Asthma, cough variant 06/30/2009   Fatty tumor 12/08/2008   Adiposity 12/06/2008   Anxiety 11/17/2008   Adaptive colitis 11/17/2008   LBP (low back pain) 11/17/2008   Social History   Tobacco Use   Smoking status: Never   Smokeless tobacco: Never  Substance Use Topics   Alcohol use: No   No Known Allergies  Current Meds  Medication Sig   albuterol (PROVENTIL) (2.5 MG/3ML) 0.083% nebulizer solution TAKE 3 MLS BY NEBULIZATION EVERY 6 HOURS AS NEEDED FOR WHEEZING OR SHORTNESS OF BREATH.   albuterol (VENTOLIN HFA) 108 (90 Base) MCG/ACT inhaler TAKE 2 PUFFS BY MOUTH EVERY 6 HOURS AS NEEDED FOR WHEEZE OR SHORTNESS OF BREATH   BREO ELLIPTA 200-25 MCG/ACT AEPB Inhale 1 puff into the lungs daily.   cetirizine (ZYRTEC) 10 MG tablet Take 10 mg by mouth at bedtime. Reported on 05/01/2016   conjugated estrogens (PREMARIN) vaginal cream Place 1 Applicatorful vaginally daily.   esomeprazole (NEXIUM) 20 MG capsule Take 20 mg by mouth daily at 12 noon.   INCRUSE ELLIPTA 62.5 MCG/ACT AEPB Inhale 1 puff into the lungs daily.   montelukast (SINGULAIR) 10 MG tablet TAKE 1 TABLET BY MOUTH EVERYDAY AT BEDTIME   predniSONE (DELTASONE) 10 MG tablet Take 2 tablets (20 mg total) by mouth daily with breakfast. Or as directed by physician.   [DISCONTINUED] Fluticasone-Umeclidin-Vilant (TRELEGY ELLIPTA) 200-62.5-25 MCG/ACT AEPB Inhale 1 puff into the lungs daily.   [DISCONTINUED] predniSONE (  DELTASONE) 20 MG tablet Take 2 tablets the first 3 days then 1 tablet daily until seen again by the physician.   Immunization History  Administered Date(s) Administered   Influenza,inj,Quad PF,6+ Mos 11/21/2014, 07/23/2017   Pneumococcal Polysaccharide-23 08/21/2012, 03/03/2013   Tdap 11/21/2014       Objective:   Physical Exam BP 118/80 (BP Location: Left Arm, Cuff Size: Normal)   Pulse 62   Temp 98.1 F (36.7 C)   Ht 5' 9.5" (1.765 m)   Wt 178 lb (80.7 kg)   SpO2 96%   BMI 25.91  kg/m  GENERAL: Well-developed, well-nourished woman, no acute distress.  No tachypnea or conversational dyspnea.  Fluent speech.  HEAD: Normocephalic, atraumatic.  EYES: Pupils equal, round, reactive to light.  No scleral icterus.  MOUTH: Dentition intact, oral mucosa moist.  No thrush. NECK: Supple. No thyromegaly. Trachea midline. No JVD.  No adenopathy. PULMONARY: Good air entry bilaterally.  No adventitious sounds. CARDIOVASCULAR: S1 and S2. Regular rate and rhythm.  No rubs, murmurs or gallops heard. ABDOMEN: Benign. MUSCULOSKELETAL: No joint deformity, no clubbing, no edema.  NEUROLOGIC: No overt focal deficit, no gait disturbance, speech is fluent. SKIN: Intact,warm,dry. PSYCH: Mood and behavior normal  Lab Results  Component Value Date   NITRICOXIDE 126 08/14/2022   Prior nitric oxide value on 10/3 was 220.  Today's value is consistent with persistent airway inflammation even on systemic steroids and asthma controllers.     Assessment & Plan:     ICD-10-CM   1. Severe persistent asthma dependent on systemic steroids  J45.50 Nitric oxide   Z79.52    Difficulty obtaining Trelegy Insurance will cover Breo and Incruse at lower tier Prescribed Breo 200 and Incruse Will need biologic(Fasenra vs Dupixent)    2. Eosinophilia, unspecified type  D72.10    Patient has eosinophilic asthma Will need biologic as noted above for control     Orders Placed This Encounter  Procedures   Nitric oxide   Order placed to pharmacy team to procure either Fasenra or [place] for the patient it appears that these are the biologics preferred by the patient's insurance.   The patient will continue prednisone at 20 mg daily for now until Biologics are initiated.  She was provided with 10 mg tablets so that taper can be done easily when she is ready for it.  Upon follow-up will assess proper timing of weaning off the steroids.  We will see the patient in follow-up in 6 weeks time she is to  contact us prior to that time should any new difficulties arise.  Renold Don, MD Advanced Bronchoscopy PCCM [facility]    *This note was dictated using voice recognition software/Dragon.  Despite best efforts to proofread, errors can occur which can change the meaning. Any transcriptional errors that result from this process are unintentional and may not be fully corrected at the time of dictation.

## 2022-08-15 NOTE — Telephone Encounter (Signed)
Submitted a Prior Authorization request to [facility] for [facility] via CoverMyMeds. Will update once we receive a response.  Key: [id number]  Knox Saliva, PharmD, MPH, BCPS, CPP Clinical Pharmacist (Rheumatology and Pulmonology)

## 2022-08-20 ENCOUNTER — Other Ambulatory Visit (HOSPITAL_COMMUNITY): Payer: Self-pay

## 2022-08-20 MED ORDER — FASENRA PEN 30 MG/ML ~~LOC~~ SOAJ
SUBCUTANEOUS | 0 refills | Status: DC
  Filled 2022-08-20: qty 1, 30d supply, fill #0
  Filled 2022-08-20: qty 1, 28d supply, fill #0

## 2022-08-20 NOTE — Telephone Encounter (Signed)
Received notification from [facility] regarding a prior authorization for [facility]. Authorization has been APPROVED from 08/15/2022 through 08/14/2023. Note: Starter Kit included in Authorization.  Per test claim, copay for 28 days supply is $4299.82  Patient can fill through [facility]: [phone]   Authorization # [id number]  Enrolled patient into Fasenra copay carD: RxBIN: [id number] PCN: [id number] Group: [id number] ID: [id number]  Called patient. Scheduled for Fasenra new start on 08/27/22. Rx for Fasenra sent to [facility] to be couriered to clinic by the appt.  Routing to [place] for onboarding needs.  Knox Saliva, PharmD, MPH, BCPS, CPP Clinical Pharmacist (Rheumatology and Pulmonology)

## 2022-08-21 ENCOUNTER — Other Ambulatory Visit (HOSPITAL_COMMUNITY): Payer: Self-pay

## 2022-08-21 NOTE — Telephone Encounter (Signed)
Delivery instructions have been updated in [facility], medication will be couriered to [facility] by 08/22/2022.  Rx has been processed in [facility] and copay card/PAP info has been provided to the pharmacy. The patient has no copay at this time.

## 2022-08-23 ENCOUNTER — Other Ambulatory Visit (HOSPITAL_COMMUNITY): Payer: Self-pay

## 2022-08-26 ENCOUNTER — Other Ambulatory Visit (HOSPITAL_COMMUNITY): Payer: Self-pay

## 2022-08-27 ENCOUNTER — Other Ambulatory Visit (HOSPITAL_COMMUNITY): Payer: Self-pay

## 2022-08-27 ENCOUNTER — Ambulatory Visit: Payer: BC Managed Care – PPO | Admitting: Pharmacist

## 2022-08-27 DIAGNOSIS — D721 Eosinophilia, unspecified: Secondary | ICD-10-CM

## 2022-08-27 DIAGNOSIS — Z7189 Other specified counseling: Secondary | ICD-10-CM

## 2022-08-27 DIAGNOSIS — J4550 Severe persistent asthma, uncomplicated: Secondary | ICD-10-CM

## 2022-08-27 MED ORDER — FASENRA PEN 30 MG/ML ~~LOC~~ SOAJ
SUBCUTANEOUS | 0 refills | Status: DC
  Filled 2022-08-27: qty 1, fill #0
  Filled 2022-09-11: qty 1, 28d supply, fill #0

## 2022-08-27 MED ORDER — FASENRA PEN 30 MG/ML ~~LOC~~ SOAJ
SUBCUTANEOUS | 1 refills | Status: DC
  Filled 2022-08-27: qty 1, fill #0
  Filled 2022-09-27: qty 1, 28d supply, fill #0
  Filled 2022-12-05: qty 1, 56d supply, fill #1

## 2022-08-27 NOTE — Patient Instructions (Signed)
Your next [facility] dose is due on 09/24/22, 10/22/2022, and every 8 weeks thereafter (starting on 12/17/22)  CONTINUE Breo and Incruse as prescribed. Call our clinic with the new year when you have insurance change so we can determine best inhaler for you .  Continue prednisone at '20mg'$  daily as prescribed until your f/u with Dr. Patsey Berthold. She can determine plan after that visit  Your prescription will be shipped from [place]. Their phone number is [phone]. They will call to schedule shipment and confirm address. They will mail your medication to your home.  You will need to be seen by your provider in 3 to 4 months to assess how FASENRA is working for you. Please ensure you have a follow-up appointment scheduled in February or March 2024. Call our clinic if you need to make this appointment.  How to manage an injection site reaction: Remember the 5 C's: COUNTER - leave on the counter at least 30 minutes but up to overnight to bring medication to room temperature. This may help prevent stinging COLD - place something cold (like an ice gel pack or cold water bottle) on the injection site just before cleansing with alcohol. This may help reduce pain CLARITIN - use Claritin (generic name is loratadine) for the first two weeks of treatment or the day of, the day before, and the day after injecting. This will help to minimize injection site reactions CORTISONE CREAM - apply if injection site is irritated and itching CALL ME - if injection site reaction is bigger than the size of your fist, looks infected, blisters, or if you develop hives

## 2022-08-27 NOTE — Progress Notes (Signed)
HPI Patient presents today to [facility] to see pharmacy team for [facility] new start.  Past medical history includes severe persistent asthma and is steroid dependent. She currently takes prednisone '20mg'$  daily.  Last saw Dr. Patsey Berthold on 08/14/2022. States her inhalers remain expensive ($300 each) because her insurance will not cover prescriptions until she meets deductible which is close to $7500. 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  OBJECTIVE No Known Allergies  Outpatient Encounter Medications as of 08/27/2022  Medication Sig   Benralizumab (FASENRA PEN) 30 MG/ML SOAJ Inject '30mg'$  into the skin at Week 8, then every 8 weeks thereafter   albuterol (PROVENTIL) (2.5 MG/3ML) 0.083% nebulizer solution TAKE 3 MLS BY NEBULIZATION EVERY 6 HOURS AS NEEDED FOR WHEEZING OR SHORTNESS OF BREATH.   albuterol (VENTOLIN HFA) 108 (90 Base) MCG/ACT inhaler TAKE 2 PUFFS BY MOUTH EVERY 6 HOURS AS NEEDED FOR WHEEZE OR SHORTNESS OF BREATH   Benralizumab (FASENRA PEN) 30 MG/ML SOAJ Inject '30mg'$  into the skin at Week 4.   BREO ELLIPTA 200-25 MCG/ACT AEPB Inhale 1 puff into the lungs daily.   cetirizine (ZYRTEC) 10 MG tablet Take 10 mg by mouth at bedtime. Reported on 05/01/2016   conjugated estrogens (PREMARIN) vaginal cream Place 1 Applicatorful vaginally daily.   esomeprazole (NEXIUM) 20 MG capsule Take 20 mg by mouth daily at 12 noon.   INCRUSE ELLIPTA 62.5 MCG/ACT AEPB Inhale 1 puff into the lungs daily.   ipratropium (ATROVENT) 0.03 % nasal spray Place 2 sprays into both nostrils every 12 (twelve) hours. (Patient not taking: Reported on 08/14/2022)   montelukast (SINGULAIR) 10 MG tablet TAKE 1 TABLET BY MOUTH EVERYDAY AT BEDTIME   predniSONE (DELTASONE) 10 MG tablet  Take 2 tablets (20 mg total) by mouth daily with breakfast. Or as directed by physician.   [DISCONTINUED] Benralizumab (FASENRA PEN) 30 MG/ML SOAJ Inject '30mg'$  into the skin at Week 0. [address] to pulm: [address] [place] [address] Appt on 08/27/22   No facility-administered encounter medications on file as of 08/27/2022.     Immunization History  Administered Date(s) Administered   Influenza,inj,Quad PF,6+ Mos 11/21/2014, 07/23/2017   Pneumococcal Polysaccharide-23 08/21/2012, 03/03/2013   Tdap 11/21/2014     PFTs    Latest Ref Rng & Units 08/13/2022    1:57 PM 10/27/2013    1:29 PM 10/05/2013    4:41 PM  PFT Results  FVC-Pre L 3.68  3.10  3.51   FVC-Predicted Pre % 92  77  82   FVC-Post L 3.71  3.15  3.50   FVC-Predicted Post % 93  78  82   Pre FEV1/FVC % % 77  72  80   Post FEV1/FCV % % 78  73  83   FEV1-Pre L 2.85  2.24  2.80   FEV1-Predicted Pre % 92  70  83   FEV1-Post L 2.89  2.28  2.90   DLCO uncorrected ml/min/mmHg 24.67   24.69   DLCO UNC% % 103   77   DLVA Predicted % 114   91   TLC L 5.21   5.53   TLC % Predicted % 89   93   RV % Predicted % 69   88      Eosinophils Most  recent blood eosinophil count was 1500 cells/microL taken on 10/3/.   IgE: 7 on 07/23/22   Assessment   Biologics training for benralizumab Berna Bue)  Goals of therapy: Mechanism of action: humanized afucosylated, monoclonal antibody (IgG1, kappa) that binds to the alpha subunit of the interleukin-5 receptor. IL-5 is the major cytokine responsible for the growth and differentiation, recruitment, activation, and survival of eosinophils (a cell type associated with inflammation and an important component in the pathogenesis of asthma) Reviewed that Berna Bue is add-on medication and patient must continue maintenance inhaler regimen. Response to therapy: may take 4 months to determine efficacy. Discussed that patients generally feel improvement sooner than 4 months.  Side  effects: antibody development (13%), headache (8%), pharyngitis (5%), injection site reaction (2.2%)  Dose: 30 mg subcutaneously at Week 0, Week 4, Week 8, then '30mg'$  every 8 weeks thereafter  Administration/Storage:   Reviewed administration sites of thigh or abdomen (at least 2-3 inches away from abdomen). Reviewed the upper arm is only appropriate if caregiver is administering injection  Do not shake the pen/syringe as this could lead to product foaming or precipitation. \ Reviewed storage of medication in refrigerator. Reviewed that Berna Bue can be stored at room temperature in unopened carton for up to 14 days.  Side effects: headache (19%), injection site reaction (7-15%), antibody development (6%), backache (5%), fatigue (5%)  Access: Approval of Fasenra through: insurance Patient enrolled into copay card program to help with copay assistance. Trelegy copay card: [place] 200 and Incruse    Patient self-administered Fasenra '30mg'$ /mL in right upper thigh using [facility]-supplied medication: Fasenra '30mg'$ /mL autoinjector pen NDC: [address]-37 Lot: [id number] Expiration: 08/2024  Patient monitored for 30 minutes for adverse reaction.  Patient tolerated without sisue. Injection site checked and no redness of swelling noted by provider. Patient denies itchiness and irritation.  Medication Reconciliation  A drug regimen assessment was performed, including review of allergies, interactions, disease-state management, dosing and immunization history. Medications were reviewed with the patient, including name, instructions, indication, goals of therapy, potential side effects, importance of adherence, and safe use.  Drug interaction(s): none noted   PLAN Continue Fasenra '30mg'$  at Week 4 on 09/24/22 and Week 8 on 10/22/2022. Then continue Fasenra '30mg'$  every 8 weeks thereafter starting on 12/17/2022. Rx sent to: [facility]: [phone] . Patient provided with pharmacy phone  number and advised that they will call in about 2-3 weeks to schedule refill to her home. Continue maintenance asthma regimen of: Breo Ellipta 200-82mg (1 puff daily), Incruse Ellipta 62.5 (1 puff daily). Reviewed absolute necessity of remaining on maintenance inhaler while on Fasenra Patient and her husband to investigate insurance plan options for 2024 so her inhalers become more affordable. Once she has new insurance, she is aware to call our office and [place] so we can first, find most cost-effective options for inhalers for her, and second, re-run [place]  All questions encouraged and answered.  Instructed patient to reach out with any further questions or concerns.  Thank you for allowing pharmacy to participate in this patient's care.  This appointment required 60 minutes of patient care (this includes precharting, -chart review, review of results, face-to-face care, etc.).  DKnox Saliva PharmD, MPH, BCPS, CPP Clinical Pharmacist (Rheumatology and Pulmonology)

## 2022-09-11 ENCOUNTER — Other Ambulatory Visit (HOSPITAL_COMMUNITY): Payer: Self-pay

## 2022-09-18 ENCOUNTER — Other Ambulatory Visit (HOSPITAL_COMMUNITY): Payer: Self-pay

## 2022-09-23 ENCOUNTER — Encounter: Payer: Self-pay | Admitting: Pulmonary Disease

## 2022-09-23 ENCOUNTER — Ambulatory Visit: Payer: BC Managed Care – PPO | Admitting: Pulmonary Disease

## 2022-09-23 VITALS — BP 118/78 | HR 72 | Temp 98.0°F | Ht 69.5 in | Wt 179.6 lb

## 2022-09-23 DIAGNOSIS — D721 Eosinophilia, unspecified: Secondary | ICD-10-CM | POA: Diagnosis not present

## 2022-09-23 DIAGNOSIS — R0602 Shortness of breath: Secondary | ICD-10-CM

## 2022-09-23 DIAGNOSIS — Z7952 Long term (current) use of systemic steroids: Secondary | ICD-10-CM

## 2022-09-23 DIAGNOSIS — J4550 Severe persistent asthma, uncomplicated: Secondary | ICD-10-CM

## 2022-09-23 LAB — NITRIC OXIDE: Nitric Oxide: 52

## 2022-09-23 NOTE — Patient Instructions (Signed)
You may decrease your prednisone to 10 mg daily (1 tablet daily).  Continue taking your Berna Bue and inhalers as you are doing.  Let us know if you need [place] to reassess your inhaler prescriptions once your new insurance plan comes into effect.  Will see you in follow-up in 4 to 6 weeks time call sooner should any new problems arise.

## 2022-09-23 NOTE — Progress Notes (Signed)
Subjective:    Patient ID: Angela Reyes, female    DOB: September 26, 1961, 61 y.o.   MRN: [id number] Patient Care Team: Gwyneth Sprout, FNP as PCP - General (Family Medicine) Tyler Pita, MD as Consulting Physician (Pulmonary Disease)  Chief Complaint  Patient presents with   Follow-up    Doing great. No SOB, wheezing or cough.     HPI This is a 61 year old lifelong never smoker with severe persistent asthma prednisone dependent, who presents for follow-up from her visit of 14 August 2022.  At her prior visit she was started on Fasenra, she is due for another injection tomorrow of the initial loading dose.  Since that visit the patient notes that her breathing markedly better.  She has not had to use her albuterol rescue since her last visit.  She is still on 20 mg of prednisone daily, Breo 200 and Incruse Ellipta daily, Singulair and Fasenra. She does not endorse any fevers, chills or sweats.  She has not had any cough or sputum production.   Laboratory data obtained on 3 October shows that she had absolute eosinophilia of 1.5 K/uL, her immunoglobulin profile was normal.  Allergen panel was negative.  Chest x-ray obtained on 3 October showed no acute cardiopulmonary disease.   Review of Systems A 10 point review of systems was performed and it is as noted above otherwise negative.  Patient Active Problem List   Diagnosis Date Noted   Moderate persistent asthma with exacerbation 12/04/2021   Pain around toenail, left foot 09/03/2021   Acute right-sided low back pain without sciatica 09/03/2021   Toenail fungus 09/03/2021   Allergic rhinitis 05/01/2015   Absolute anemia 03/06/2015   At risk for osteoporosis 03/06/2015   Long term current use of systemic steroids 03/06/2015   Acid reflux 03/06/2015   External hemorrhoid 03/06/2015   Plantar fasciitis 03/06/2015   Severe persistent asthma 01/27/2014   Lung nodule 10/14/2013   Chronic cough 09/12/2013   Asthma, cough variant  06/30/2009   Fatty tumor 12/08/2008   Adiposity 12/06/2008   Anxiety 11/17/2008   Adaptive colitis 11/17/2008   LBP (low back pain) 11/17/2008   Social History   Tobacco Use   Smoking status: Never   Smokeless tobacco: Never  Substance Use Topics   Alcohol use: No   Current Meds  Medication Sig   albuterol (PROVENTIL) (2.5 MG/3ML) 0.083% nebulizer solution TAKE 3 MLS BY NEBULIZATION EVERY 6 HOURS AS NEEDED FOR WHEEZING OR SHORTNESS OF BREATH.   albuterol (VENTOLIN HFA) 108 (90 Base) MCG/ACT inhaler TAKE 2 PUFFS BY MOUTH EVERY 6 HOURS AS NEEDED FOR WHEEZE OR SHORTNESS OF BREATH   Benralizumab (FASENRA PEN) 30 MG/ML SOAJ Inject '30mg'$  into the skin at Week 4.   Benralizumab (FASENRA PEN) 30 MG/ML SOAJ Inject '30mg'$  into the skin at Week 8, then every 8 weeks thereafter   BREO ELLIPTA 200-25 MCG/ACT AEPB Inhale 1 puff into the lungs daily.   cetirizine (ZYRTEC) 10 MG tablet Take 10 mg by mouth at bedtime. Reported on 05/01/2016   conjugated estrogens (PREMARIN) vaginal cream Place 1 Applicatorful vaginally daily.   esomeprazole (NEXIUM) 20 MG capsule Take 20 mg by mouth daily at 12 noon.   INCRUSE ELLIPTA 62.5 MCG/ACT AEPB Inhale 1 puff into the lungs daily.   montelukast (SINGULAIR) 10 MG tablet TAKE 1 TABLET BY MOUTH EVERYDAY AT BEDTIME   predniSONE (DELTASONE) 10 MG tablet Take 2 tablets (20 mg total) by mouth daily with breakfast. Or as  directed by physician.   Immunization History  Administered Date(s) Administered   Influenza,inj,Quad PF,6+ Mos 11/21/2014, 07/23/2017   Pneumococcal Polysaccharide-23 08/21/2012, 03/03/2013   Tdap 11/21/2014       Objective:   Physical Exam BP 118/78 (BP Location: Left Arm, Cuff Size: Normal)   Pulse 72   Temp 98 F (36.7 C)   Ht 5' 9.5" (1.765 m)   Wt 179 lb 9.6 oz (81.5 kg)   LMP 02/10/2015 Comment: 1st one since dec 2014  SpO2 100%   BMI 26.14 kg/m  GENERAL: Well-developed, well-nourished woman, no acute distress.  No tachypnea or  conversational dyspnea.  Fluent speech.  HEAD: Normocephalic, atraumatic.  EYES: Pupils equal, round, reactive to light.  No scleral icterus.  MOUTH: Dentition intact, oral mucosa moist.  No thrush. NECK: Supple. No thyromegaly. Trachea midline. No JVD.  No adenopathy. PULMONARY: Good air entry bilaterally.  No adventitious sounds. CARDIOVASCULAR: S1 and S2. Regular rate and rhythm.  No rubs, murmurs or gallops heard. ABDOMEN: Benign. MUSCULOSKELETAL: No joint deformity, no clubbing, no edema.  NEUROLOGIC: No overt focal deficit, no gait disturbance, speech is fluent. SKIN: Intact,warm,dry. PSYCH: Mood and behavior normal   Lab Results  Component Value Date   NITRICOXIDE 52 09/23/2022  Prior values>>220>>126>>52 Persistent type II inflammation however, improving    Assessment & Plan:     ICD-10-CM   1. Severe persistent asthma dependent on systemic steroids  J45.50 Nitric oxide   G92.11    Eosinophilic phenotype On Fasenra Continue Breo and Incruse Continue as needed albuterol and Singulair Begin tapering steroids    2. Eosinophilia, unspecified type  D72.10    On Fasenra    3. Shortness of breath  R06.02    Resolved on current regimen     Orders Placed This Encounter  Procedures   Nitric oxide    Patient is doing well on her current regimen.  We will begin by tapering prednisone to 10 mg daily.  Her current insurance does not cover Trelegy she is on [place] and Incruse.  She is to get a new insurance plan in January, if Trelegy is covered we will switch her to that at that point.    Renold Don, MD Advanced Bronchoscopy PCCM [facility]    *This note was dictated using voice recognition software/Dragon.  Despite best efforts to proofread, errors can occur which can change the meaning. Any transcriptional errors that result from this process are unintentional and may not be fully corrected at the time of dictation.

## 2022-09-27 ENCOUNTER — Other Ambulatory Visit (HOSPITAL_COMMUNITY): Payer: Self-pay

## 2022-10-09 ENCOUNTER — Other Ambulatory Visit (HOSPITAL_COMMUNITY): Payer: Self-pay

## 2022-10-14 ENCOUNTER — Other Ambulatory Visit: Payer: Self-pay | Admitting: Pulmonary Disease

## 2022-11-04 ENCOUNTER — Ambulatory Visit: Payer: BC Managed Care – PPO | Admitting: Pulmonary Disease

## 2022-11-04 ENCOUNTER — Encounter: Payer: Self-pay | Admitting: Pulmonary Disease

## 2022-11-04 VITALS — BP 138/84 | HR 77 | Temp 98.0°F | Ht 69.5 in | Wt 184.1 lb

## 2022-11-04 DIAGNOSIS — Z7952 Long term (current) use of systemic steroids: Secondary | ICD-10-CM

## 2022-11-04 DIAGNOSIS — D721 Eosinophilia, unspecified: Secondary | ICD-10-CM | POA: Diagnosis not present

## 2022-11-04 DIAGNOSIS — J4550 Severe persistent asthma, uncomplicated: Secondary | ICD-10-CM

## 2022-11-04 LAB — NITRIC OXIDE: Nitric Oxide: 53

## 2022-11-04 MED ORDER — PREDNISONE 5 MG PO TABS: 5.0000 mg | ORAL_TABLET | Freq: Every day | ORAL | 2 refills | Status: DC

## 2022-11-04 NOTE — Progress Notes (Signed)
Subjective:    Patient ID: Angela Reyes, female    DOB: 07/27/1961, 62 y.o.   MRN: [id number] Patient Care Team: Gwyneth Sprout, FNP as PCP - General (Family Medicine) Tyler Pita, MD as Consulting Physician (Pulmonary Disease)  Chief Complaint  Patient presents with   Follow-up    Doing good. No SOB, wheezing or cough.    HPI This is a 62 year old lifelong never smoker with severe persistent asthma prednisone dependent, who presents for follow-up from her visit of 23 September 2022.  At her prior visit she had started  [facility] was noted to be doing well and tolerating the medication.  She continues to have good response to the [place].  She has been tolerating decreasing prednisone to 10 mg daily.  No recent flares.  No need for albuterol rescue at all. She is on Breo 200 and Incruse Ellipta daily, Singulair and Fasenra. She does not endorse any fevers, chills or sweats.  She has not had any cough or sputum production.   Laboratory data obtained on 3 October shows that she had absolute eosinophilia of 1.5 K/uL, her immunoglobulin profile was normal.  Allergen panel was negative.  Chest x-ray obtained on 3 October showed no acute cardiopulmonary disease.   Review of Systems A 10 point review of systems was performed and it is as noted above otherwise negative.  Patient Active Problem List   Diagnosis Date Noted   Moderate persistent asthma with exacerbation 12/04/2021   Pain around toenail, left foot 09/03/2021   Acute right-sided low back pain without sciatica 09/03/2021   Toenail fungus 09/03/2021   Allergic rhinitis 05/01/2015   Absolute anemia 03/06/2015   At risk for osteoporosis 03/06/2015   Long term current use of systemic steroids 03/06/2015   Acid reflux 03/06/2015   External hemorrhoid 03/06/2015   Plantar fasciitis 03/06/2015   Severe persistent asthma 01/27/2014   Lung nodule 10/14/2013   Chronic cough 09/12/2013   Asthma, cough variant 06/30/2009   Fatty  tumor 12/08/2008   Adiposity 12/06/2008   Anxiety 11/17/2008   Adaptive colitis 11/17/2008   LBP (low back pain) 11/17/2008   Social History   Tobacco Use   Smoking status: Never   Smokeless tobacco: Never  Substance Use Topics   Alcohol use: No   No Known Allergies  Current Meds  Medication Sig   albuterol (PROVENTIL) (2.5 MG/3ML) 0.083% nebulizer solution TAKE 3 MLS BY NEBULIZATION EVERY 6 HOURS AS NEEDED FOR WHEEZING OR SHORTNESS OF BREATH.   albuterol (VENTOLIN HFA) 108 (90 Base) MCG/ACT inhaler TAKE 2 PUFFS BY MOUTH EVERY 6 HOURS AS NEEDED FOR WHEEZE OR SHORTNESS OF BREATH   Benralizumab (FASENRA PEN) 30 MG/ML SOAJ Inject '30mg'$  into the skin at Week 4.   Benralizumab (FASENRA PEN) 30 MG/ML SOAJ Inject '30mg'$  into the skin at Week 8, then every 8 weeks thereafter   BREO ELLIPTA 200-25 MCG/ACT AEPB Inhale 1 puff into the lungs daily.   cetirizine (ZYRTEC) 10 MG tablet Take 10 mg by mouth at bedtime. Reported on 05/01/2016   conjugated estrogens (PREMARIN) vaginal cream Place 1 Applicatorful vaginally daily.   esomeprazole (NEXIUM) 20 MG capsule Take 20 mg by mouth daily at 12 noon.   INCRUSE ELLIPTA 62.5 MCG/ACT AEPB Inhale 1 puff into the lungs daily.   montelukast (SINGULAIR) 10 MG tablet TAKE 1 TABLET BY MOUTH EVERYDAY AT BEDTIME   predniSONE (DELTASONE) 10 MG tablet TAKE 2 TABLETS (20 MG TOTAL) BY MOUTH DAILY WITH BREAKFAST. OR  AS DIRECTED BY PHYSICIAN. (Patient taking differently: Take 10 mg by mouth daily with breakfast. Or as directed by physician.)   Immunization History  Administered Date(s) Administered   Influenza,inj,Quad PF,6+ Mos 11/21/2014, 07/23/2017   Pneumococcal Polysaccharide-23 08/21/2012, 03/03/2013   Tdap 11/21/2014       Objective:   Physical Exam BP 138/84 (BP Location: Left Arm, Cuff Size: Normal)   Pulse 77   Temp 98 F (36.7 C)   Ht 5' 9.5" (1.765 m)   Wt 184 lb 2.2 oz (83.5 kg)   LMP 02/10/2015 Comment: 1st one since dec 2014  SpO2 97%   BMI  26.80 kg/m   SpO2: 97 % O2 Device: None (Room air)  GENERAL: Well-developed, well-nourished woman, no acute distress.  No tachypnea or conversational dyspnea.  Fluent speech.  HEAD: Normocephalic, atraumatic.  EYES: Pupils equal, round, reactive to light.  No scleral icterus.  MOUTH: Dentition intact, oral mucosa moist.  No thrush. NECK: Supple. No thyromegaly. Trachea midline. No JVD.  No adenopathy. PULMONARY: Good air entry bilaterally.  No adventitious sounds. CARDIOVASCULAR: S1 and S2. Regular rate and rhythm.  No rubs, murmurs or gallops heard. ABDOMEN: Benign. MUSCULOSKELETAL: No joint deformity, no clubbing, no edema.  NEUROLOGIC: No overt focal deficit, no gait disturbance, speech is fluent. SKIN: Intact,warm,dry. PSYCH: Mood and behavior normal    Lab Results  Component Value Date   NITRICOXIDE 53 11/04/2022        Assessment & Plan:     ICD-10-CM   1. Severe persistent asthma dependent on systemic steroids  J45.50 Nitric oxide   Z79.52    Continue Breo and Incruse Continue Fasenra Decreasing prednisone to 5 mg daily Follow-up 8 to 10 weeks    2. Eosinophilia, unspecified type  D72.10    On Fasenra     Meds ordered this encounter  Medications   predniSONE (DELTASONE) 5 MG tablet    Sig: Take 1 tablet (5 mg total) by mouth daily with breakfast.    Dispense:  30 tablet    Refill:  2   Will see the patient in follow-up in 8 to 10 weeks time she is to contact us prior to that time should any new difficulties arise.  Renold Don, MD Advanced Bronchoscopy PCCM  [facility]    *This note was dictated using voice recognition software/Dragon.  Despite best efforts to proofread, errors can occur which can change the meaning. Any transcriptional errors that result from this process are unintentional and may not be fully corrected at the time of dictation.

## 2022-11-04 NOTE — Patient Instructions (Addendum)
Continue Berna Bue, continue Breo and Incruse for now.  We are decreasing prednisone to 5 mg daily.  We sent the prescription to your pharmacy for the 5 mg tablet.  Hopefully by the time you get your next injection of Berna Bue we will be able to wean off completely off the prednisone.  We will see you in follow-up in 8 to 10 weeks time call sooner should any new problems arise.

## 2022-12-05 ENCOUNTER — Other Ambulatory Visit (HOSPITAL_COMMUNITY): Payer: Self-pay

## 2022-12-10 ENCOUNTER — Other Ambulatory Visit (HOSPITAL_COMMUNITY): Payer: Self-pay

## 2022-12-24 ENCOUNTER — Other Ambulatory Visit: Payer: Self-pay | Admitting: Pulmonary Disease

## 2022-12-24 NOTE — Telephone Encounter (Signed)
Dr. Patsey Berthold, please advise if okay to refill. Last note mentions tapering

## 2022-12-24 NOTE — Telephone Encounter (Signed)
She should be only on 5 mg daily.  I wrote a prescription in January for this if she needs refills she can have the 5 mg only until seen again.

## 2022-12-24 NOTE — Telephone Encounter (Signed)
Rx refilled.

## 2022-12-30 ENCOUNTER — Encounter: Payer: Self-pay | Admitting: Pulmonary Disease

## 2022-12-30 ENCOUNTER — Ambulatory Visit: Payer: BC Managed Care – PPO | Admitting: Pulmonary Disease

## 2022-12-30 VITALS — BP 130/80 | HR 78 | Temp 97.6°F | Ht 69.5 in | Wt 183.6 lb

## 2022-12-30 DIAGNOSIS — J301 Allergic rhinitis due to pollen: Secondary | ICD-10-CM

## 2022-12-30 DIAGNOSIS — J4550 Severe persistent asthma, uncomplicated: Secondary | ICD-10-CM | POA: Diagnosis not present

## 2022-12-30 DIAGNOSIS — D721 Eosinophilia, unspecified: Secondary | ICD-10-CM

## 2022-12-30 DIAGNOSIS — Z7952 Long term (current) use of systemic steroids: Secondary | ICD-10-CM

## 2022-12-30 LAB — NITRIC OXIDE: Nitric Oxide: 62

## 2022-12-30 NOTE — Progress Notes (Signed)
Subjective:    Patient ID: Angela Reyes, female    DOB: 17-Dec-1960, 62 y.o.   MRN: [id number] Patient Care Team: Gwyneth Sprout, FNP as PCP - General (Family Medicine) Tyler Pita, MD as Consulting Physician (Pulmonary Disease)  Chief Complaint  Patient presents with   Follow-up    No SOB, wheezing or cough.     HPI This is a 62 year old lifelong never smoker with severe persistent asthma prednisone dependent, who presents for follow-up from her visit of 04 November 2022.  She was started on Fasenra on 27 August 2022, continues to be doing well and tolerating the medication.  She has tolerated decreasing prednisone to 5 mg daily.  No recent flares.  No need for albuterol rescue at all.  She did have some issues with increased nasal congestion and sneezing yesterday but this cleared without major intervention.  No fevers, chills or sweats.  No cough or sputum production.  She is on Breo 200 and Incruse Ellipta daily, Singulair and Fasenra.   DATA 05/15/2022 allergen panel: IgE 5 IU/mL, no allergens identified, Aspergillus antibody negative. 07/23/2022 immunoglobulins: Normal. 07/23/2022 CBC with differential: Eosinophils absolute 1.5 K/uL. 07/23/2022 chest x-ray PA and lateral: No acute cardiopulmonary disease. 08/13/2022 PFTs: FEV1 2.85 L or 92% predicted, FVC 3.68 L or 92% predicted, FEV1/FVC 77%, no significant bronchodilator response.  Lung volumes at lower limit of normal, diffusion capacity normal.  Essentially within normal limits.  Review of Systems A 10 point review of systems was performed and it is as noted above otherwise negative.  Patient Active Problem List   Diagnosis Date Noted   Moderate persistent asthma with exacerbation 12/04/2021   Pain around toenail, left foot 09/03/2021   Acute right-sided low back pain without sciatica 09/03/2021   Toenail fungus 09/03/2021   Allergic rhinitis 05/01/2015   Absolute anemia 03/06/2015   At risk for osteoporosis  03/06/2015   Long term current use of systemic steroids 03/06/2015   Acid reflux 03/06/2015   External hemorrhoid 03/06/2015   Plantar fasciitis 03/06/2015   Severe persistent asthma 01/27/2014   Lung nodule 10/14/2013   Chronic cough 09/12/2013   Asthma, cough variant 06/30/2009   Fatty tumor 12/08/2008   Adiposity 12/06/2008   Anxiety 11/17/2008   Adaptive colitis 11/17/2008   LBP (low back pain) 11/17/2008   Social History   Tobacco Use   Smoking status: Never   Smokeless tobacco: Never  Substance Use Topics   Alcohol use: No   No Known Allergies Current Meds  Medication Sig   albuterol (PROVENTIL) (2.5 MG/3ML) 0.083% nebulizer solution TAKE 3 MLS BY NEBULIZATION EVERY 6 HOURS AS NEEDED FOR WHEEZING OR SHORTNESS OF BREATH.   albuterol (VENTOLIN HFA) 108 (90 Base) MCG/ACT inhaler TAKE 2 PUFFS BY MOUTH EVERY 6 HOURS AS NEEDED FOR WHEEZE OR SHORTNESS OF BREATH   Benralizumab (FASENRA PEN) 30 MG/ML SOAJ Inject '30mg'$  into the skin at Week 8, then every 8 weeks thereafter   BREO ELLIPTA 200-25 MCG/ACT AEPB Inhale 1 puff into the lungs daily.   cetirizine (ZYRTEC) 10 MG tablet Take 10 mg by mouth at bedtime. Reported on 05/01/2016   conjugated estrogens (PREMARIN) vaginal cream Place 1 Applicatorful vaginally daily.   esomeprazole (NEXIUM) 20 MG capsule Take 20 mg by mouth daily at 12 noon.   INCRUSE ELLIPTA 62.5 MCG/ACT AEPB Inhale 1 puff into the lungs daily.   montelukast (SINGULAIR) 10 MG tablet TAKE 1 TABLET BY MOUTH EVERYDAY AT BEDTIME   predniSONE (DELTASONE)  5 MG tablet Take 1 tablet (5 mg total) by mouth daily with breakfast.   Immunization History  Administered Date(s) Administered   Influenza,inj,Quad PF,6+ Mos 11/21/2014, 07/23/2017   Pneumococcal Polysaccharide-23 08/21/2012, 03/03/2013   Tdap 11/21/2014       Objective:   Physical Exam BP 130/80 (BP Location: Left Arm, Cuff Size: Normal)   Pulse 78   LMP 02/10/2015 Comment: 1st one since dec 2014  SpO2 98%    SpO2: 98 % O2 Device: None (Room air)  GENERAL: Well-developed, well-nourished woman, no acute distress.  No tachypnea or conversational dyspnea.  Fluent speech.  Mild nasal quality to speech today. HEAD: Normocephalic, atraumatic.  EYES: Pupils equal, round, reactive to light.  No scleral icterus.  MOUTH: Dentition intact, oral mucosa moist.  No thrush. NECK: Supple. No thyromegaly. Trachea midline. No JVD.  No adenopathy. PULMONARY: Good air entry bilaterally.  No adventitious sounds. CARDIOVASCULAR: S1 and S2. Regular rate and rhythm.  No rubs, murmurs or gallops heard. ABDOMEN: Benign. MUSCULOSKELETAL: No joint deformity, no clubbing, no edema.  NEUROLOGIC: No overt focal deficit, no gait disturbance, speech is fluent. SKIN: Intact,warm,dry. PSYCH: Mood and behavior normal.  Lab Results  Component Value Date   NITRICOXIDE 62 12/30/2022         Assessment & Plan:     ICD-10-CM   1. Severe persistent asthma dependent on systemic steroids  J45.50 Nitric oxide   Z79.52    Continue Breo and Incruse Continue as needed albuterol Continue Fasenra Decrease prednisone to 5 mg 4 times daily x 2 weeks DC prednisone after 2 weeks    2. Eosinophilia, unspecified type  D72.10    On Fasenra    3. Seasonal allergic rhinitis due to pollen  J30.1    Continue use of Zyrtec as needed Continue Singulair     Will go ahead and decrease prednisone to 5 mg every other day for 2 weeks.  If she notices no increase need for rescue medication or that she is doing well she can stop the prednisone.  Continue Fasenra.  Will see her in follow-up in 4 months time.  She is of course to contact us sooner should any new difficulties arise.  Her FeNO remains between 50-60 suspect this is her "baseline" we will continue to monitor.  Renold Don, MD Advanced Bronchoscopy PCCM  [facility]    *This note was dictated using voice recognition software/Dragon.  Despite best  efforts to proofread, errors can occur which can change the meaning. Any transcriptional errors that result from this process are unintentional and may not be fully corrected at the time of dictation.

## 2022-12-30 NOTE — Patient Instructions (Signed)
You may decrease prednisone to 5 mg every other day for 2 weeks.  If you notice you are doing well you can stop the prednisone.  We will see you in follow-up in 4 months time, call sooner should any new problems arise.

## 2023-01-31 ENCOUNTER — Other Ambulatory Visit (HOSPITAL_COMMUNITY): Payer: Self-pay

## 2023-01-31 ENCOUNTER — Other Ambulatory Visit: Payer: Self-pay | Admitting: Pulmonary Disease

## 2023-01-31 ENCOUNTER — Other Ambulatory Visit: Payer: Self-pay

## 2023-01-31 DIAGNOSIS — D721 Eosinophilia, unspecified: Secondary | ICD-10-CM

## 2023-01-31 DIAGNOSIS — J4550 Severe persistent asthma, uncomplicated: Secondary | ICD-10-CM

## 2023-01-31 MED ORDER — FASENRA PEN 30 MG/ML ~~LOC~~ SOAJ
SUBCUTANEOUS | 1 refills | Status: DC
  Filled 2023-01-31: qty 1, 56d supply, fill #0
  Filled 2023-03-31: qty 1, 56d supply, fill #1

## 2023-02-03 ENCOUNTER — Other Ambulatory Visit (HOSPITAL_COMMUNITY): Payer: Self-pay

## 2023-02-04 ENCOUNTER — Other Ambulatory Visit: Payer: Self-pay

## 2023-03-31 ENCOUNTER — Other Ambulatory Visit (HOSPITAL_COMMUNITY): Payer: Self-pay

## 2023-04-02 ENCOUNTER — Other Ambulatory Visit (HOSPITAL_COMMUNITY): Payer: Self-pay

## 2023-04-04 ENCOUNTER — Other Ambulatory Visit (HOSPITAL_COMMUNITY): Payer: Self-pay

## 2023-05-12 ENCOUNTER — Ambulatory Visit (INDEPENDENT_AMBULATORY_CARE_PROVIDER_SITE_OTHER): Payer: BC Managed Care – PPO | Admitting: Family Medicine

## 2023-05-12 ENCOUNTER — Encounter: Payer: Self-pay | Admitting: Family Medicine

## 2023-05-12 VITALS — BP 137/83 | HR 77 | Temp 97.8°F | Ht 69.5 in | Wt 183.6 lb

## 2023-05-12 DIAGNOSIS — U071 COVID-19: Secondary | ICD-10-CM

## 2023-05-12 DIAGNOSIS — J069 Acute upper respiratory infection, unspecified: Secondary | ICD-10-CM

## 2023-05-12 MED ORDER — NIRMATRELVIR/RITONAVIR (PAXLOVID)TABLET: 3.0000 | ORAL_TABLET | Freq: Two times a day (BID) | ORAL | 0 refills | Status: AC

## 2023-05-13 DIAGNOSIS — J069 Acute upper respiratory infection, unspecified: Secondary | ICD-10-CM | POA: Insufficient documentation

## 2023-05-13 LAB — POC COVID19 BINAXNOW: SARS Coronavirus 2 Ag: POSITIVE — AB

## 2023-05-13 NOTE — Progress Notes (Signed)
Established patient visit   Patient: Angela Reyes   DOB: 1960-11-03   62 y.o. Female  MRN: [id number] Visit Date: 05/12/2023  Today's healthcare provider: Jacky Kindle, FNP  Introduced to nurse practitioner role and practice setting.  All questions answered.  Discussed provider/patient relationship and expectations.  Subjective    HPI HPI   Patient is present due to sinus discomfort. Reports she has had symptoms for at least a month but this weekend she felt pretty bad. Patient reports symptoms of sore/scratchy throat, head congestion, headache. She takes mucinex D as well as daily medications along with tylenol.  Last edited by Acey Lav, CMA on 05/12/2023  3:30 PM.      Given symptoms worsening over the weekend; recommend COVID testing given normal physical examination outside of ill appearance.  Medications: Outpatient Medications Prior to Visit  Medication Sig   albuterol (PROVENTIL) (2.5 MG/3ML) 0.083% nebulizer solution TAKE 3 MLS BY NEBULIZATION EVERY 6 HOURS AS NEEDED FOR WHEEZING OR SHORTNESS OF BREATH.   albuterol (VENTOLIN HFA) 108 (90 Base) MCG/ACT inhaler TAKE 2 PUFFS BY MOUTH EVERY 6 HOURS AS NEEDED FOR WHEEZE OR SHORTNESS OF BREATH   Benralizumab (FASENRA PEN) 30 MG/ML SOAJ Inject 30mg  into the skin at Week 8, then every 8 weeks thereafter   BREO ELLIPTA 200-25 MCG/ACT AEPB Inhale 1 puff into the lungs daily.   cetirizine (ZYRTEC) 10 MG tablet Take 10 mg by mouth at bedtime. Reported on 05/01/2016   conjugated estrogens (PREMARIN) vaginal cream Place 1 Applicatorful vaginally daily.   esomeprazole (NEXIUM) 20 MG capsule Take 20 mg by mouth daily at 12 noon.   INCRUSE ELLIPTA 62.5 MCG/ACT AEPB Inhale 1 puff into the lungs daily.   montelukast (SINGULAIR) 10 MG tablet TAKE 1 TABLET BY MOUTH EVERYDAY AT BEDTIME   predniSONE (DELTASONE) 5 MG tablet Take 1 tablet (5 mg total) by mouth daily with breakfast.   No facility-administered medications prior to  visit.      Objective    BP 137/83   Pulse 77   Temp 97.8 F (36.6 C) (Oral)   Ht 5' 9.5" (1.765 m)   Wt 183 lb 9.6 oz (83.3 kg)   LMP 02/10/2015 Comment: 1st one since dec 2014  SpO2 100%   BMI 26.72 kg/m   Physical Exam Vitals and nursing note reviewed.  Constitutional:      General: She is not in acute distress.    Appearance: Normal appearance. She is overweight. She is ill-appearing. She is not toxic-appearing or diaphoretic.  HENT:     Head: Normocephalic and atraumatic.     Right Ear: Tympanic membrane, ear canal and external ear normal.     Left Ear: Tympanic membrane, ear canal and external ear normal.     Nose: Congestion and rhinorrhea present.     Mouth/Throat:     Mouth: Mucous membranes are moist.     Pharynx: Oropharynx is clear.     Comments: Endorses sore throat Cardiovascular:     Rate and Rhythm: Normal rate and regular rhythm.     Pulses: Normal pulses.     Heart sounds: Normal heart sounds. No murmur heard.    No friction rub. No gallop.  Pulmonary:     Effort: Pulmonary effort is normal. No respiratory distress.     Breath sounds: Normal breath sounds. No stridor. No wheezing, rhonchi or rales.  Chest:     Chest wall: No tenderness.  Musculoskeletal:  General: No swelling, tenderness, deformity or signs of injury. Normal range of motion.     Right lower leg: No edema.     Left lower leg: No edema.  Skin:    General: Skin is warm and dry.     Capillary Refill: Capillary refill takes less than 2 seconds.     Coloration: Skin is not jaundiced or pale.     Findings: No bruising, erythema, lesion or rash.  Neurological:     General: No focal deficit present.     Mental Status: She is alert and oriented to person, place, and time. Mental status is at baseline.     Cranial Nerves: No cranial nerve deficit.     Sensory: No sensory deficit.     Motor: No weakness.     Coordination: Coordination normal.  Psychiatric:        Mood and Affect:  Mood normal.        Behavior: Behavior normal.        Thought Content: Thought content normal.        Judgment: Judgment normal.     Results for orders placed or performed in visit on 05/12/23  POC COVID-19  Result Value Ref Range   SARS Coronavirus 2 Ag Positive (A) Negative    Assessment & Plan     Problem List Items Addressed This Visit       Respiratory   Upper respiratory tract infection - Primary    Long standing history of allergies and persistent asthma; previously well controlled Variable allergies symptoms without asthma flare 2 days of acutely worse URI symptoms POC Covid immediately; pt wishes to proceed with paxlovid. eGFR reviewed.      Relevant Medications   nirmatrelvir/ritonavir (PAXLOVID) 20 x 150 MG & 10 x 100MG  TABS   Other Relevant Orders   POC COVID-19 (Completed)   Other Visit Diagnoses     COVID-19       Relevant Medications   nirmatrelvir/ritonavir (PAXLOVID) 20 x 150 MG & 10 x 100MG  TABS      Return if symptoms worsen or fail to improve.     Leilani Merl, FNP, have reviewed all documentation for this visit. The documentation on 05/13/23 for the exam, diagnosis, procedures, and orders are all accurate and complete.  Jacky Kindle, FNP  [facility] [phone] (phone) [phone] (fax)  [facility]

## 2023-05-13 NOTE — Assessment & Plan Note (Signed)
Long standing history of allergies and persistent asthma; previously well controlled Variable allergies symptoms without asthma flare 2 days of acutely worse URI symptoms POC Covid immediately; pt wishes to proceed with paxlovid. eGFR reviewed.

## 2023-05-22 ENCOUNTER — Other Ambulatory Visit (HOSPITAL_COMMUNITY): Payer: Self-pay

## 2023-05-22 ENCOUNTER — Other Ambulatory Visit: Payer: Self-pay | Admitting: Pulmonary Disease

## 2023-05-22 ENCOUNTER — Other Ambulatory Visit: Payer: Self-pay

## 2023-05-22 DIAGNOSIS — D721 Eosinophilia, unspecified: Secondary | ICD-10-CM

## 2023-05-22 DIAGNOSIS — J4550 Severe persistent asthma, uncomplicated: Secondary | ICD-10-CM

## 2023-05-22 MED ORDER — FASENRA PEN 30 MG/ML ~~LOC~~ SOAJ
SUBCUTANEOUS | 1 refills | Status: DC
  Filled 2023-05-22: qty 1, 56d supply, fill #0
  Filled 2023-07-15: qty 1, 56d supply, fill #1

## 2023-05-27 ENCOUNTER — Other Ambulatory Visit: Payer: Self-pay

## 2023-06-06 ENCOUNTER — Encounter: Payer: Self-pay | Admitting: Pulmonary Disease

## 2023-06-06 ENCOUNTER — Ambulatory Visit: Payer: BC Managed Care – PPO | Admitting: Pulmonary Disease

## 2023-06-06 VITALS — BP 122/82 | HR 75 | Temp 98.1°F | Ht 69.5 in | Wt 185.4 lb

## 2023-06-06 DIAGNOSIS — D721 Eosinophilia, unspecified: Secondary | ICD-10-CM | POA: Diagnosis not present

## 2023-06-06 DIAGNOSIS — Z7952 Long term (current) use of systemic steroids: Secondary | ICD-10-CM | POA: Diagnosis not present

## 2023-06-06 DIAGNOSIS — J301 Allergic rhinitis due to pollen: Secondary | ICD-10-CM

## 2023-06-06 DIAGNOSIS — J4550 Severe persistent asthma, uncomplicated: Secondary | ICD-10-CM

## 2023-06-06 LAB — NITRIC OXIDE: Nitric Oxide: 40

## 2023-06-06 NOTE — Progress Notes (Signed)
Subjective:    Patient ID: Angela Reyes, female    DOB: Feb 28, 1961, 62 y.o.   MRN: [id number]  Patient Care Team: Jacky Kindle, FNP as PCP - General (Family Medicine) Salena Saner, MD as Consulting Physician (Pulmonary Disease)  Chief Complaint  Patient presents with   Follow-up    Covid 1 month ago. No SOB, wheezing or cough.    HPI This is a 62 year old lifelong never smoker with severe persistent asthma, who presents for follow-up from her visit of 11 March of 2024.  She was started on Fasenra on 27 August 2022, continues to be doing well and tolerating the medication.  At her visit in March we gave her instructions to taper off and wean off of steroids and she was able to do this without difficulty.  She has had no recent flares. Use of rescue inhaler is perhaps once a month.  She did have COVID-19 1 month ago however did not developed any shortness of breath or chest congestion with the illness. It manifested more by nasal congestion.  He did not require hospitalization.  She did take Paxlovid.  She has had no fevers, chills or sweats since she had COVID.  No cough or sputum production.  She is on Breo 200.  She stopped using Incruse Ellipta and has not noticed any worsening of her respiratory symptoms.  She is on Singulair for chronic rhinitis.  She is compliant with Harrington Challenger.  She does not endorse any other symptomatology today.  Overall she feels well and looks well.  DATA 05/15/2022 allergen panel: IgE 5 IU/mL, no allergens identified, Aspergillus antibody negative. 07/23/2022 nitric oxide: 220 ppb 07/23/2022 immunoglobulins: Normal. 07/23/2022 CBC with differential: Eosinophils absolute 1.5 K/uL. 07/23/2022 chest x-ray PA and lateral: No acute cardiopulmonary disease. 08/13/2022 PFTs: FEV1 2.85 L or 92% predicted, FVC 3.68 L or 92% predicted, FEV1/FVC 77%, no significant bronchodilator response.  Lung volumes at lower limit of normal, diffusion capacity normal.   Essentially within normal limits. 08/14/2022 nitric oxide: 126 ppb 08/27/2022 Fasenra: started 09/23/2022 nitric oxide: 52 ppb 11/04/2022 nitric oxide: 53 ppb 12/30/2022 nitric oxide: 62 ppb 06/06/2023 nitric oxide: 40 ppb  Review of Systems A 10 point review of systems was performed and it is as noted above otherwise negative.   Patient Active Problem List   Diagnosis Date Noted   Upper respiratory tract infection 05/13/2023   Moderate persistent asthma with exacerbation 12/04/2021   Pain around toenail, left foot 09/03/2021   Acute right-sided low back pain without sciatica 09/03/2021   Toenail fungus 09/03/2021   Allergic rhinitis 05/01/2015   Absolute anemia 03/06/2015   At risk for osteoporosis 03/06/2015   Long term current use of systemic steroids 03/06/2015   Acid reflux 03/06/2015   External hemorrhoid 03/06/2015   Plantar fasciitis 03/06/2015   Severe persistent asthma 01/27/2014   Lung nodule 10/14/2013   Chronic cough 09/12/2013   Asthma, cough variant 06/30/2009   Fatty tumor 12/08/2008   Adiposity 12/06/2008   Anxiety 11/17/2008   Adaptive colitis 11/17/2008   LBP (low back pain) 11/17/2008    Social History   Tobacco Use   Smoking status: Never   Smokeless tobacco: Never  Substance Use Topics   Alcohol use: No   No Known Allergies  Current Meds  Medication Sig   albuterol (PROVENTIL) (2.5 MG/3ML) 0.083% nebulizer solution TAKE 3 MLS BY NEBULIZATION EVERY 6 HOURS AS NEEDED FOR WHEEZING OR SHORTNESS OF BREATH.   albuterol (VENTOLIN HFA) 108 (  90 Base) MCG/ACT inhaler TAKE 2 PUFFS BY MOUTH EVERY 6 HOURS AS NEEDED FOR WHEEZE OR SHORTNESS OF BREATH   benralizumab (FASENRA PEN) 30 MG/ML prefilled autoinjector Inject 30mg  into the skin at Week 8, then every 8 weeks thereafter   BREO ELLIPTA 200-25 MCG/ACT AEPB Inhale 1 puff into the lungs daily.   cetirizine (ZYRTEC) 10 MG tablet Take 10 mg by mouth at bedtime. Reported on 05/01/2016   conjugated estrogens  (PREMARIN) vaginal cream Place 1 Applicatorful vaginally daily.   esomeprazole (NEXIUM) 20 MG capsule Take 20 mg by mouth daily at 12 noon.   INCRUSE ELLIPTA 62.5 MCG/ACT AEPB Inhale 1 puff into the lungs daily.    Immunization History  Administered Date(s) Administered   Influenza,inj,Quad PF,6+ Mos 11/21/2014, 07/23/2017   Pneumococcal Polysaccharide-23 08/21/2012, 03/03/2013   Tdap 11/21/2014      Objective:   BP 122/82 (BP Location: Left Arm, Cuff Size: Normal)   Pulse 75   Temp 98.1 F (36.7 C)   Ht 5' 9.5" (1.765 m)   Wt 185 lb 6.4 oz (84.1 kg)   LMP 02/10/2015 Comment: 1st one since dec 2014  SpO2 95%   BMI 26.99 kg/m   SpO2: 95 % O2 Device: None (Room air)  GENERAL: Well-developed, well-nourished woman, no acute distress.  No tachypnea or conversational dyspnea.  Fluent speech.  Mild nasal quality to speech today. HEAD: Normocephalic, atraumatic.  EYES: Pupils equal, round, reactive to light.  No scleral icterus.  MOUTH: Dentition intact, oral mucosa moist.  No thrush. NECK: Supple. No thyromegaly. Trachea midline. No JVD.  No adenopathy. PULMONARY: Good air entry bilaterally.  No adventitious sounds. CARDIOVASCULAR: S1 and S2. Regular rate and rhythm.  No rubs, murmurs or gallops heard. ABDOMEN: Benign. MUSCULOSKELETAL: No joint deformity, no clubbing, no edema.  NEUROLOGIC: No overt focal deficit, no gait disturbance, speech is fluent. SKIN: Intact,warm,dry. PSYCH: Mood and behavior normal.   Lab Results  Component Value Date   NITRICOXIDE 40 06/06/2023  *220>>126>>52>>53>>62>>40  Assessment & Plan:     ICD-10-CM   1. Severe persistent asthma without complication  J45.50 Nitric oxide   Continue Fasenra Continue Breo Ellipta Okay to discontinue Incruse Continue as needed albuterol    2. Seasonal allergic rhinitis due to pollen  J30.1    On Singulair and as needed Zyrtec Nasal hygiene    3. Eosinophilia, unspecified type  D72.10    Continue Fasenra       Orders Placed This Encounter  Procedures   Nitric oxide   Overall Damaria is doing very well on her current regimen.  Evidence of type II inflammation is decreasing.  Continue Breo Ellipta 1 puff daily, continue as needed albuterol.  We will continue to wean off medications as tolerated on subsequent visits.  We will see her in follow-up in 4 to 6 months time call sooner should any new problems arise.    Gailen Shelter, MD Advanced Bronchoscopy PCCM  [facility]    *This note was dictated using voice recognition software/Dragon.  Despite best efforts to proofread, errors can occur which can change the meaning. Any transcriptional errors that result from this process are unintentional and may not be fully corrected at the time of dictation.

## 2023-06-06 NOTE — Patient Instructions (Addendum)
Your level of inflammation today is 40.  This is much better than prior.  Your lungs sounded very clear today.  It is okay to discontinue the Incruse.  Continue to use your Breo Ellipta.  Continue your as needed albuterol.  We will see her in follow-up in 4 to 6 months time call sooner should any new problems arise.

## 2023-06-12 ENCOUNTER — Telehealth: Payer: Self-pay | Admitting: Pharmacist

## 2023-06-12 NOTE — Telephone Encounter (Signed)
Received new Harrington Challenger copay card information that has become active on 06/02/2023. Patient states she brought copy to appt but did not retain copy. Will email copy back to her address. She confirmed correct address  New copay card info: ID: [id number] BIN: [id number] Group: [id number] PCN: [id number]  Chesley Mires, PharmD, MPH, BCPS, CPP Clinical Pharmacist (Rheumatology and Pulmonology)

## 2023-06-16 ENCOUNTER — Ambulatory Visit: Payer: BC Managed Care – PPO | Admitting: Pulmonary Disease

## 2023-06-18 ENCOUNTER — Other Ambulatory Visit (HOSPITAL_COMMUNITY): Payer: Self-pay

## 2023-07-15 ENCOUNTER — Other Ambulatory Visit (HOSPITAL_COMMUNITY): Payer: Self-pay

## 2023-07-15 NOTE — Progress Notes (Signed)
Specialty Pharmacy Refill Coordination Note  Angela Reyes is a 62 y.o. female contacted today regarding refills of specialty medication(s) Benralizumab .  Patient requested Delivery  on 07/17/23  to verified address [address] [place] [address]   Medication will be filled on 07/16/23.

## 2023-07-16 ENCOUNTER — Other Ambulatory Visit: Payer: Self-pay

## 2023-07-16 ENCOUNTER — Other Ambulatory Visit (HOSPITAL_COMMUNITY): Payer: Self-pay

## 2023-07-16 NOTE — Progress Notes (Signed)
Specialty Pharmacy Ongoing Clinical Assessment Note  Angela Reyes is a 62 y.o. female who is being followed by the specialty pharmacy service for RxSp Asthma/COPD   Review of patient's specialty medication(s) Benralizumab  occurred today.   Patient has missed 0  doses in the last 4 weeks.   Patient did not have any additional questions or concerns.   Therapeutic benefit summary: Patient is achieving benefit   Adverse events/side effects summary: No adverse events/side effects   Patient's therapy is appropriate to : Continue    Goals      Maintain pulmonary function as close to normal as possible     Patient is on track . Patient will maintain adherence         Follow up:  6 months

## 2023-08-19 ENCOUNTER — Other Ambulatory Visit: Payer: Self-pay | Admitting: Pulmonary Disease

## 2023-08-27 ENCOUNTER — Other Ambulatory Visit (HOSPITAL_COMMUNITY): Payer: Self-pay

## 2023-09-03 ENCOUNTER — Other Ambulatory Visit (HOSPITAL_COMMUNITY): Payer: Self-pay

## 2023-09-03 ENCOUNTER — Other Ambulatory Visit: Payer: Self-pay

## 2023-09-03 ENCOUNTER — Other Ambulatory Visit: Payer: Self-pay | Admitting: Pulmonary Disease

## 2023-09-03 DIAGNOSIS — D721 Eosinophilia, unspecified: Secondary | ICD-10-CM

## 2023-09-03 DIAGNOSIS — J4550 Severe persistent asthma, uncomplicated: Secondary | ICD-10-CM

## 2023-09-03 MED ORDER — FASENRA PEN 30 MG/ML ~~LOC~~ SOAJ
SUBCUTANEOUS | 1 refills | Status: DC
  Filled 2023-09-03: qty 1, 56d supply, fill #0
  Filled 2023-10-29: qty 1, 56d supply, fill #1

## 2023-09-03 NOTE — Progress Notes (Signed)
Specialty Pharmacy Refill Coordination Note  Angela Reyes is a 62 y.o. female contacted today regarding refills of specialty medication(s) Benralizumab   Patient requested Delivery   Delivery date: 09/10/23   Verified address: [address] [place] [address]   Medication will be filled on 09/09/23 for 09/23/23 injection.  Refill request pending.

## 2023-09-04 ENCOUNTER — Other Ambulatory Visit (HOSPITAL_COMMUNITY): Payer: Self-pay

## 2023-09-09 ENCOUNTER — Other Ambulatory Visit: Payer: Self-pay

## 2023-09-09 ENCOUNTER — Telehealth: Payer: Self-pay

## 2023-09-09 ENCOUNTER — Other Ambulatory Visit (HOSPITAL_COMMUNITY): Payer: Self-pay

## 2023-09-09 NOTE — Telephone Encounter (Signed)
Received notification from [facility] that pt needs a new PA.  Initiated a Prior Authorization request to [employer] for [employer] via CoverMyMeds. Waiting for clinical questions to populate.  Key:  [id number]

## 2023-09-09 NOTE — Telephone Encounter (Signed)
Received message stating that new PA is not required. Claim still rejects for 56 days stating that maximum days supply is 30 and then also rejects as a 30 day supply stating that a new PA is required.  Reached out to [facility] PA dept for additional help, the rep states that they've been having issues with CMM requests not being able to reach them and that filling out a faxed PA form is currently the best option for submission. Fax has been sent to our office, will need to complete and return once it has been received.

## 2023-09-09 NOTE — Progress Notes (Signed)
Need PA message sent to [facility].  Spoke with patient & aware of the delay. Please call patient with new shipping date once approved.  There is some kind of disconnect with [employer] and CMM. They are going to fax a PA form to office, however there is a turnaround time of 72 hours.

## 2023-09-10 ENCOUNTER — Other Ambulatory Visit (HOSPITAL_COMMUNITY): Payer: Self-pay

## 2023-09-10 NOTE — Telephone Encounter (Signed)
PA renewal form completed and faxed back to [facility]. Will wait for response.  Phone #: [phone] Fax #: [phone]

## 2023-09-12 ENCOUNTER — Other Ambulatory Visit: Payer: Self-pay

## 2023-09-12 ENCOUNTER — Other Ambulatory Visit (HOSPITAL_COMMUNITY): Payer: Self-pay

## 2023-09-15 ENCOUNTER — Other Ambulatory Visit: Payer: Self-pay

## 2023-09-15 NOTE — Progress Notes (Signed)
Patient was notified PA was approved medication will be filled 09/15/23 and delivered 09/16/23 to Verified address: [address] [place]  [id number]

## 2023-09-16 ENCOUNTER — Other Ambulatory Visit (HOSPITAL_COMMUNITY): Payer: Self-pay

## 2023-09-16 NOTE — Telephone Encounter (Signed)
Received notification from [facility] regarding a prior authorization for [facility]. Authorization has been APPROVED from 09/10/2023 to 09/08/2024. Approval letter sent to scan center.  Patient can continue to fill through [facility]: [phone]   Authorization # [id number] Phone # [phone]  Chesley Mires, PharmD, MPH, BCPS, CPP Clinical Pharmacist (Rheumatology and Pulmonology)

## 2023-09-23 ENCOUNTER — Telehealth: Payer: BC Managed Care – PPO

## 2023-09-23 NOTE — Telephone Encounter (Signed)
Received alternative request or PA required for Breo Ellipta 200-25 MCG/ACT.

## 2023-09-24 ENCOUNTER — Other Ambulatory Visit (HOSPITAL_COMMUNITY): Payer: Self-pay

## 2023-09-24 NOTE — Telephone Encounter (Signed)
Showing PA not needed-refill too soon to fill

## 2023-09-24 NOTE — Telephone Encounter (Signed)
[facility] pharmacy notified that PA is not needed. Nothing further needed.

## 2023-10-28 ENCOUNTER — Other Ambulatory Visit (HOSPITAL_COMMUNITY): Payer: Self-pay

## 2023-10-29 ENCOUNTER — Other Ambulatory Visit (HOSPITAL_COMMUNITY): Payer: Self-pay | Admitting: Pharmacy Technician

## 2023-10-29 ENCOUNTER — Other Ambulatory Visit (HOSPITAL_COMMUNITY): Payer: Self-pay

## 2023-10-29 NOTE — Progress Notes (Signed)
 Specialty Pharmacy Refill Coordination Note  Angela Reyes is a 63 y.o. female contacted today regarding refills of specialty medication(s) Benralizumab  (Fasenra  Pen)   Patient requested Delivery   Delivery date: 11/11/23   Verified address: [address] [place]   Medication will be filled on 11/10/23.

## 2023-11-07 ENCOUNTER — Other Ambulatory Visit: Payer: Self-pay

## 2023-11-10 ENCOUNTER — Other Ambulatory Visit: Payer: Self-pay

## 2023-12-04 ENCOUNTER — Ambulatory Visit: Payer: BC Managed Care – PPO | Admitting: Pulmonary Disease

## 2023-12-04 ENCOUNTER — Encounter: Payer: Self-pay | Admitting: Pulmonary Disease

## 2023-12-04 VITALS — BP 128/78 | HR 88 | Temp 98.6°F | Resp 16 | Ht 69.5 in | Wt 183.4 lb

## 2023-12-04 DIAGNOSIS — J0110 Acute frontal sinusitis, unspecified: Secondary | ICD-10-CM

## 2023-12-04 DIAGNOSIS — J4550 Severe persistent asthma, uncomplicated: Secondary | ICD-10-CM | POA: Diagnosis not present

## 2023-12-04 MED ORDER — AZITHROMYCIN 250 MG PO TABS: ORAL_TABLET | ORAL | 0 refills | Status: AC

## 2023-12-04 MED ORDER — METHYLPREDNISOLONE 4 MG PO TBPK: ORAL_TABLET | ORAL | 0 refills | Status: DC

## 2023-12-04 NOTE — Progress Notes (Signed)
Subjective:    Patient ID: Angela Reyes, female    DOB: 12-Jan-1961, 63 y.o.   MRN: [id number]  Patient Care Team: Jacky Kindle, FNP as PCP - General (Family Medicine) Salena Saner, MD as Consulting Physician (Pulmonary Disease)  Chief Complaint  Patient presents with   Follow-up    BACKGROUND/INTERVAL: 63 year old lifelong never smoker with severe persistent asthma, eosinophilic type, currently on Sandra.  Follows up from her visit of 06 June 2023.  Notes good control of asthma with Breo Ellipta 200 and as needed albuterol.  Consistent with Harrington Challenger use.  Sinus issues since Christmas.  HPI  Discussed the use of AI scribe software for clinical note transcription with the patient, who gave verbal consent to proceed.  History of Present Illness   Angela Reyes is a 63 year old female with asthma who presents with nasal congestion since Christmas.  She has been experiencing nasal congestion since Christmas, describing it as glue-like and occasionally dripping. Despite using over-the-counter medications, she is unable to clear it completely. She has previously consulted an Ear, Nose, and Throat specialist before the COVID pandemic. Currently, she is taking Mucinex D with little relief. Her symptoms vary daily, with some days being better than others, and she occasionally experiences tenderness in the sinus area.  Some purulent drainage when she is able to clear it.  Her asthma is well-controlled, and she is doing well with her asthma shots, specifically mentioning Harrington Challenger, which she receives every eight weeks. She notes that this treatment has made a significant difference in her condition.     She has not had any fevers, chills or sweats.  No cough or sputum production.  Despite her sinus issues she has not require rescue inhaler over her usual which is perhaps once a month.  DATA 05/15/2022 allergen panel: IgE 5 IU/mL, no allergens identified, Aspergillus antibody  negative. 07/23/2022 nitric oxide: 220 ppb 07/23/2022 immunoglobulins: Normal. 07/23/2022 CBC with differential: Eosinophils absolute 1.5 K/uL. 07/23/2022 chest x-ray PA and lateral: No acute cardiopulmonary disease. 08/13/2022 PFTs: FEV1 2.85 L or 92% predicted, FVC 3.68 L or 92% predicted, FEV1/FVC 77%, no significant bronchodilator response.  Lung volumes at lower limit of normal, diffusion capacity normal.  Essentially within normal limits. 08/14/2022 nitric oxide: 126 ppb 08/27/2022 Fasenra: started 09/23/2022 nitric oxide: 52 ppb 11/04/2022 nitric oxide: 53 ppb 12/30/2022 nitric oxide: 62 ppb 06/06/2023 nitric oxide: 40 ppb    Review of Systems A 10 point review of systems was performed and it is as noted above otherwise negative.   Patient Active Problem List   Diagnosis Date Noted   Upper respiratory tract infection 05/13/2023   Moderate persistent asthma with exacerbation 12/04/2021   Pain around toenail, left foot 09/03/2021   Acute right-sided low back pain without sciatica 09/03/2021   Toenail fungus 09/03/2021   Allergic rhinitis 05/01/2015   Absolute anemia 03/06/2015   At risk for osteoporosis 03/06/2015   Long term current use of systemic steroids 03/06/2015   Acid reflux 03/06/2015   External hemorrhoid 03/06/2015   Plantar fasciitis 03/06/2015   Severe persistent asthma 01/27/2014   Lung nodule 10/14/2013   Chronic cough 09/12/2013   Asthma, cough variant 06/30/2009   Fatty tumor 12/08/2008   Adiposity 12/06/2008   Anxiety 11/17/2008   Adaptive colitis 11/17/2008   Low back pain 11/17/2008    Social History   Tobacco Use   Smoking status: Never   Smokeless tobacco: Never  Substance Use Topics   Alcohol  use: No    No Known Allergies  Current Meds  Medication Sig   albuterol (PROVENTIL) (2.5 MG/3ML) 0.083% nebulizer solution TAKE 3 MLS BY NEBULIZATION EVERY 6 HOURS AS NEEDED FOR WHEEZING OR SHORTNESS OF BREATH.   albuterol (VENTOLIN HFA) 108 (90  Base) MCG/ACT inhaler TAKE 2 PUFFS BY MOUTH EVERY 6 HOURS AS NEEDED FOR WHEEZE OR SHORTNESS OF BREATH   azithromycin (ZITHROMAX) 250 MG tablet Take 2 tablets (500 mg) on  Day 1,  followed by 1 tablet (250 mg) once daily on Days 2 through 5.   benralizumab (FASENRA PEN) 30 MG/ML prefilled autoinjector Inject 30mg  into the skin at Week 8, then every 8 weeks thereafter   BREO ELLIPTA 200-25 MCG/ACT AEPB TAKE 1 PUFF BY MOUTH EVERY DAY   cetirizine (ZYRTEC) 10 MG tablet Take 10 mg by mouth at bedtime. Reported on 05/01/2016   conjugated estrogens (PREMARIN) vaginal cream Place 1 Applicatorful vaginally daily.   esomeprazole (NEXIUM) 20 MG capsule Take 20 mg by mouth daily at 12 noon.   INCRUSE ELLIPTA 62.5 MCG/ACT AEPB Inhale 1 puff into the lungs daily.   methylPREDNISolone (MEDROL DOSEPAK) 4 MG TBPK tablet Take as directed in the package.   montelukast (SINGULAIR) 10 MG tablet TAKE 1 TABLET BY MOUTH EVERYDAY AT BEDTIME   [DISCONTINUED] predniSONE (DELTASONE) 5 MG tablet Take 1 tablet (5 mg total) by mouth daily with breakfast.    Immunization History  Administered Date(s) Administered   Influenza,inj,Quad PF,6+ Mos 11/21/2014, 07/23/2017   Pneumococcal Polysaccharide-23 08/21/2012, 03/03/2013   Tdap 11/21/2014        Objective:     BP 128/78   Pulse 88   Temp 98.6 F (37 C) (Temporal)   Resp 16   Ht 5' 9.5" (1.765 m)   Wt 183 lb 6.4 oz (83.2 kg)   LMP 02/10/2015 Comment: 1st one since dec 2014  SpO2 98%   BMI 26.70 kg/m   SpO2: 98 %  GENERAL: Well-developed, well-nourished woman, no acute distress.  No tachypnea or conversational dyspnea.  Fluent speech.  Nasal quality to speech today. HEAD: Normocephalic, atraumatic.  Tenderness over frontal sinuses bilaterally. EYES: Pupils equal, round, reactive to light.  No scleral icterus.  MOUTH: Dentition intact, oral mucosa moist.  No thrush. NECK: Supple. No thyromegaly. Trachea midline. No JVD.  No adenopathy. PULMONARY: Good air  entry bilaterally.  No adventitious sounds. CARDIOVASCULAR: S1 and S2. Regular rate and rhythm.  No rubs, murmurs or gallops heard. ABDOMEN: Benign. MUSCULOSKELETAL: No joint deformity, no clubbing, no edema.  NEUROLOGIC: No overt focal deficit, no gait disturbance, speech is fluent. SKIN: Intact,warm,dry. PSYCH: Mood and behavior normal.    Assessment & Plan:     ICD-10-CM   1. Severe persistent asthma without complication  J45.50     2. Acute frontal sinusitis, recurrence not specified  J01.10       Meds ordered this encounter  Medications   methylPREDNISolone (MEDROL DOSEPAK) 4 MG TBPK tablet    Sig: Take as directed in the package.    Dispense:  21 tablet    Refill:  0   azithromycin (ZITHROMAX) 250 MG tablet    Sig: Take 2 tablets (500 mg) on  Day 1,  followed by 1 tablet (250 mg) once daily on Days 2 through 5.    Dispense:  6 each    Refill:  0   Discussion:    Sinusitis Chronic sinus congestion since Christmas with thick nasal discharge, especially in the mornings, and intermittent  sinus tenderness. Likely sinusitis flare. Current treatment with OTC Mucinex D ineffective. Discussed switching to regular Mucinex (extra strength) to avoid drying and thickening of secretions by the decongestant component in Mucinex D. Informed about short course of prednisone and antibiotic to help loosen congestion. - Prescribe short course of methylprednisolone (Medrol Dosepak - Prescribe antibiotic (azithromycin) - Switch to regular Mucinex (extra strength) BID - Encourage adequate hydration  Asthma Asthma well-controlled with Fasenra every eight weeks, significantly improving her condition. No current asthma symptoms. Deferred nitric oxide testing due to sinusitis flare and supply issues. - Continue Fasenra every eight weeks - Defer nitric oxide testing due to sinusitis flare and supply issues - Follow-up in 3-4 weeks to reassess asthma and sinusitis  Follow-up - Schedule  follow-up appointment in 3-4 weeks.      Advised if symptoms do not improve or worsen, to please contact office for sooner follow up or seek emergency care.    I spent 30 minutes of dedicated to the care of this patient on the date of this encounter to include pre-visit review of records, face-to-face time with the patient discussing conditions above, post visit ordering of testing, clinical documentation with the electronic health record, making appropriate referrals as documented, and communicating necessary findings to members of the patients care team.     C. Danice Goltz, MD Advanced Bronchoscopy PCCM [facility]    *This note was generated using voice recognition software/Dragon and/or AI transcription program.  Despite best efforts to proofread, errors can occur which can change the meaning. Any transcriptional errors that result from this process are unintentional and may not be fully corrected at the time of dictation.

## 2023-12-04 NOTE — Patient Instructions (Signed)
VISIT SUMMARY:  During today's visit, we discussed your ongoing nasal congestion and well-controlled asthma. You have been experiencing persistent nasal congestion since Christmas, which has not improved with over-the-counter medications. Your asthma remains well-managed with your current treatment plan.  YOUR PLAN:  -SINUSITIS: Sinusitis is an inflammation of the sinuses that can cause congestion, thick nasal discharge, and sinus tenderness. We will switch you to regular Mucinex (extra strength) taken twice a day to help with your symptoms. Additionally, we will start you on a short course of prednisone and an antibiotic to help reduce the inflammation and clear the infection. Make sure to stay well-hydrated.  -ASTHMA: Asthma is a condition where your airways narrow and swell, making it difficult to breathe. Your asthma is currently well-controlled with Harrington Challenger, which you receive every eight weeks. We will continue this treatment and defer nitric oxide testing for now due to your sinusitis flare. We will reassess your asthma and sinusitis in 3-4 weeks.  INSTRUCTIONS:  Please schedule a follow-up appointment in 3-4 weeks to reassess your asthma and sinusitis.

## 2023-12-25 ENCOUNTER — Encounter: Payer: Self-pay | Admitting: Pulmonary Disease

## 2023-12-25 ENCOUNTER — Ambulatory Visit: Payer: BC Managed Care – PPO | Admitting: Pulmonary Disease

## 2023-12-25 VITALS — BP 124/84 | HR 74 | Temp 96.9°F | Ht 69.5 in | Wt 185.0 lb

## 2023-12-25 DIAGNOSIS — J4550 Severe persistent asthma, uncomplicated: Secondary | ICD-10-CM

## 2023-12-25 DIAGNOSIS — J3089 Other allergic rhinitis: Secondary | ICD-10-CM

## 2023-12-25 DIAGNOSIS — D721 Eosinophilia, unspecified: Secondary | ICD-10-CM | POA: Diagnosis not present

## 2023-12-25 LAB — NITRIC OXIDE: Nitric Oxide: 33

## 2023-12-25 NOTE — Progress Notes (Signed)
 Subjective:    Patient ID: Angela Reyes, female    DOB: 07-24-1961, 63 y.o.   MRN: [id number]  Patient Care Team: Jacky Kindle, FNP as PCP - General (Family Medicine) Salena Saner, MD as Consulting Physician (Pulmonary Disease)  Chief Complaint  Patient presents with   Follow-up    No SOB, wheezing or cough.     BACKGROUND/INTERVAL:63 year old lifelong never smoker with severe persistent asthma, eosinophilic type, currently on Sandra. Follows up from her visit of 04 December 2023.  At that time she had a minor asthma exacerbation triggered by sinusitis.  HPI  DATA 05/15/2022 allergen panel: IgE 5 IU/mL, no allergens identified, Aspergillus antibody negative. 07/23/2022 nitric oxide: 220 ppb 07/23/2022 immunoglobulins: Normal. 07/23/2022 CBC with differential: Eosinophils absolute 1.5 K/uL. 07/23/2022 chest x-ray PA and lateral: No acute cardiopulmonary disease. 08/13/2022 PFTs: FEV1 2.85 L or 92% predicted, FVC 3.68 L or 92% predicted, FEV1/FVC 77%, no significant bronchodilator response.  Lung volumes at lower limit of normal, diffusion capacity normal.  Essentially within normal limits. 08/14/2022 nitric oxide: 126 ppb 08/27/2022 Fasenra: started 09/23/2022 nitric oxide: 52 ppb 11/04/2022 nitric oxide: 53 ppb 12/30/2022 nitric oxide: 62 ppb 06/06/2023 nitric oxide: 40 ppb 12/25/2023 nitirc oxide: 33 ppb  Review of Systems A 10 point review of systems was performed and it is as noted above otherwise negative.   Patient Active Problem List   Diagnosis Date Noted   Upper respiratory tract infection 05/13/2023   Moderate persistent asthma with exacerbation 12/04/2021   Pain around toenail, left foot 09/03/2021   Acute right-sided low back pain without sciatica 09/03/2021   Toenail fungus 09/03/2021   Allergic rhinitis 05/01/2015   Absolute anemia 03/06/2015   At risk for osteoporosis 03/06/2015   Long term current use of systemic steroids 03/06/2015   Acid  reflux 03/06/2015   External hemorrhoid 03/06/2015   Plantar fasciitis 03/06/2015   Severe persistent asthma 01/27/2014   Lung nodule 10/14/2013   Chronic cough 09/12/2013   Asthma, cough variant 06/30/2009   Fatty tumor 12/08/2008   Adiposity 12/06/2008   Anxiety 11/17/2008   Adaptive colitis 11/17/2008   Low back pain 11/17/2008    Social History   Tobacco Use   Smoking status: Never   Smokeless tobacco: Never  Substance Use Topics   Alcohol use: No    No Known Allergies  Current Meds  Medication Sig   albuterol (PROVENTIL) (2.5 MG/3ML) 0.083% nebulizer solution TAKE 3 MLS BY NEBULIZATION EVERY 6 HOURS AS NEEDED FOR WHEEZING OR SHORTNESS OF BREATH.   albuterol (VENTOLIN HFA) 108 (90 Base) MCG/ACT inhaler TAKE 2 PUFFS BY MOUTH EVERY 6 HOURS AS NEEDED FOR WHEEZE OR SHORTNESS OF BREATH   benralizumab (FASENRA PEN) 30 MG/ML prefilled autoinjector Inject 30mg  into the skin at Week 8, then every 8 weeks thereafter   BREO ELLIPTA 200-25 MCG/ACT AEPB TAKE 1 PUFF BY MOUTH EVERY DAY   cetirizine (ZYRTEC) 10 MG tablet Take 10 mg by mouth at bedtime. Reported on 05/01/2016   conjugated estrogens (PREMARIN) vaginal cream Place 1 Applicatorful vaginally daily.   esomeprazole (NEXIUM) 20 MG capsule Take 20 mg by mouth daily at 12 noon.    Immunization History  Administered Date(s) Administered   Influenza,inj,Quad PF,6+ Mos 11/21/2014, 07/23/2017   Pneumococcal Polysaccharide-23 08/21/2012, 03/03/2013   Tdap 11/21/2014        Objective:     BP 124/84 (BP Location: Right Arm, Cuff Size: Normal)   Pulse 74   Temp (!) 96.9 F (  36.1 C)   Ht 5' 9.5" (1.765 m)   Wt 185 lb (83.9 kg)   LMP 02/10/2015 Comment: 1st one since dec 2014  SpO2 97%   BMI 26.93 kg/m   SpO2: 97 % O2 Device: None (Room air)  GENERAL: Well-developed, well-nourished woman, no acute distress.  No tachypnea or conversational dyspnea.  Fluent speech.  Mild nasal quality to speech today. HEAD: Normocephalic,  atraumatic.  EYES: Pupils equal, round, reactive to light.  No scleral icterus.  MOUTH: Dentition intact, oral mucosa moist.  No thrush. NECK: Supple. No thyromegaly. Trachea midline. No JVD.  No adenopathy. PULMONARY: Good air entry bilaterally.  No adventitious sounds. CARDIOVASCULAR: S1 and S2. Regular rate and rhythm.  No rubs, murmurs or gallops heard. ABDOMEN: Benign. MUSCULOSKELETAL: No joint deformity, no clubbing, no edema.  NEUROLOGIC: No overt focal deficit, no gait disturbance, speech is fluent. SKIN: Intact,warm,dry. PSYCH: Mood and behavior normal.   Lab Results  Component Value Date   NITRICOXIDE 33 12/25/2023    Assessment & Plan:     ICD-10-CM   1. Severe persistent asthma without complication  J45.50 Nitric oxide    2. Perennial allergic rhinitis  J30.89     3. Eosinophilia, unspecified type  D72.10       Orders Placed This Encounter  Procedures   Nitric oxide   Discussion:    Asthma Exacerbation Itzabella presents for follow-up after a mid-February asthma exacerbation. She reports improvement but not full recovery. Physical exam shows no wheezing. Nitric oxide level is 33, which is acceptable given her lowest recorded level of 40 previously. Discussed the importance of monitoring inflammation and the potential need for extending prednisone. Reviewed risks of prolonged prednisone use, including weight gain, hypertension, and osteoporosis, versus benefits of reduced inflammation and prevention of further exacerbations. Alternative approaches include adjusting her current asthma management plan and close monitoring without extending prednisone. Overall she is doing better.  Feels that Harrington Challenger has been a Corporate treasurer".  As she is getting better will not add any new medications today. - Check nitric oxide level - Follow-up in 4 months.    Advised if symptoms do not improve or worsen, to please contact office for sooner follow up or seek emergency care.    I  spent 30 minutes of dedicated to the care of this patient on the date of this encounter to include pre-visit review of records, face-to-face time with the patient discussing conditions above, post visit ordering of testing, clinical documentation with the electronic health record, making appropriate referrals as documented, and communicating necessary findings to members of the patients care team.     C. Danice Goltz, MD Advanced [facility]ch[facility]-    *This note was generated using voice recognition software/Dragon and/or AI transcription program.  Despite best efforts to proofread, errors can occur which can change the meaning. Any transcriptional errors that result from this process are unintentional and may not be fully corrected at the time of dictation.

## 2023-12-25 NOTE — Patient Instructions (Addendum)
 VISIT SUMMARY:  Angela Reyes, you had a follow-up visit today to check on your asthma after your exacerbation in mid-February. You mentioned that you are feeling much better, although not completely recovered. We discussed your current treatment and the importance of monitoring your inflammation levels.  YOUR PLAN:  -ASTHMA EXACERBATION: An asthma exacerbation is a worsening of asthma symptoms. You are feeling better now, and your physical exam shows no wheezing. Your nitric oxide level is 33, which is good compared to your previous lowest level of 40. We talked about the importance of monitoring your inflammation and the possibility of extending your prednisone treatment. We also reviewed the risks and benefits of prolonged prednisone use. For now, we will check your inflammation levels and nitric oxide level regularly and follow up in 4 months.  INSTRUCTIONS:  Please follow up in 4 months for a re-evaluation of your asthma and to check your inflammation and nitric oxide levels.  For more information, you can read your full clinical note, available in your patient portal.

## 2024-01-01 ENCOUNTER — Other Ambulatory Visit (HOSPITAL_COMMUNITY): Payer: Self-pay

## 2024-01-01 ENCOUNTER — Other Ambulatory Visit: Payer: Self-pay

## 2024-01-01 ENCOUNTER — Other Ambulatory Visit: Payer: Self-pay | Admitting: Pulmonary Disease

## 2024-01-01 DIAGNOSIS — J4550 Severe persistent asthma, uncomplicated: Secondary | ICD-10-CM

## 2024-01-01 DIAGNOSIS — D721 Eosinophilia, unspecified: Secondary | ICD-10-CM

## 2024-01-01 MED ORDER — FASENRA PEN 30 MG/ML ~~LOC~~ SOAJ
SUBCUTANEOUS | 1 refills | Status: DC
  Filled 2024-01-01: qty 1, 56d supply, fill #0
  Filled 2024-02-19 – 2024-02-26 (×2): qty 1, 56d supply, fill #1

## 2024-01-01 NOTE — Progress Notes (Signed)
 Specialty Pharmacy Refill Coordination Note  Angela Reyes is a 63 y.o. female contacted today regarding refills of specialty medication(s) Benralizumab Harrington Challenger Pen)   Patient requested Delivery   Delivery date: 01/08/24   Verified address: [address], [place], [place] [address]   Medication will be filled on 01/07/24.This fill date is pending response to refill request from provider. Patient is aware and if they have not received fill by intended date they must follow up with pharmacy.

## 2024-01-01 NOTE — Progress Notes (Signed)
 Specialty Pharmacy Ongoing Clinical Assessment Note  Angela Reyes is a 63 y.o. female who is being followed by the specialty pharmacy service for RxSp Asthma/COPD   Patient's specialty medication(s) reviewed today: Benralizumab (Fasenra Pen)   Missed doses in the last 4 weeks: 0   Patient/Caregiver did not have any additional questions or concerns.   Therapeutic benefit summary: Patient is achieving benefit   Adverse events/side effects summary: No adverse events/side effects   Patient's therapy is appropriate to: Continue    Goals Addressed             This Visit's Progress    Maintain pulmonary function as close to normal as possible   On track    Patient is on track . Patient will maintain adherence         Follow up:  6 months  Servando Snare Specialty Pharmacist

## 2024-01-01 NOTE — Telephone Encounter (Signed)
 Refill sent for [facility] to [facility]: [phone]   Dose: 30mg  subcut every 8 weeks  Last OV: 12/25/2023 Provider: Dr. Jayme Cloud  Next OV: 04/19/2024  Chesley Mires, PharmD, MPH, BCPS Clinical Pharmacist (Rheumatology and Pulmonology)

## 2024-01-07 ENCOUNTER — Other Ambulatory Visit: Payer: Self-pay

## 2024-02-17 ENCOUNTER — Other Ambulatory Visit: Payer: Self-pay

## 2024-02-19 ENCOUNTER — Other Ambulatory Visit: Payer: Self-pay | Admitting: Pharmacy Technician

## 2024-02-19 ENCOUNTER — Other Ambulatory Visit: Payer: Self-pay

## 2024-02-19 NOTE — Progress Notes (Signed)
 Specialty Pharmacy Refill Coordination Note  Angela Reyes is a 63 y.o. female contacted today regarding refills of specialty medication(s) Benralizumab  (Fasenra  Pen)   Patient requested Delivery   Delivery date: 02/27/24   Verified address: [address], [place], [place] [address]   Medication will be filled on 02/26/24.

## 2024-02-26 ENCOUNTER — Other Ambulatory Visit (HOSPITAL_COMMUNITY): Payer: Self-pay

## 2024-02-26 ENCOUNTER — Telehealth: Payer: Self-pay

## 2024-02-26 ENCOUNTER — Other Ambulatory Visit: Payer: Self-pay

## 2024-02-26 NOTE — Progress Notes (Signed)
 New insurance requires PA. Routed to Rheum/Pulm.

## 2024-02-26 NOTE — Progress Notes (Signed)
 Submitted an URGENT Prior Authorization request to [facility] for FASENRA  via CoverMyMeds. Will update once we receive a response.  Key: [id number]

## 2024-02-26 NOTE — Telephone Encounter (Signed)
 Received notification from [facility] regarding a prior authorization for FASENRA . Authorization has been APPROVED from 02/26/2024 to 02/25/2025. Approval letter sent to scan center.  Patient can continue to fill through [facility]: [phone]   Authorization # [id number]  Geraldene Kleine, PharmD, MPH, BCPS, CPP Clinical Pharmacist (Rheumatology and Pulmonology)

## 2024-02-26 NOTE — Progress Notes (Signed)
 Ship 05/12 Left voicemail. PA approved 5/8. Cannot mail until Monday 5/12.

## 2024-02-26 NOTE — Progress Notes (Signed)
 Received notification from [facility] regarding a prior authorization for FASENRA . Authorization has been APPROVED from 02/26/2024 to 02/25/2025. Approval letter sent to scan center.  Patient can continue to fill through [facility]: [phone]   Authorization # [id number]  Geraldene Kleine, PharmD, MPH, BCPS, CPP Clinical Pharmacist (Rheumatology and Pulmonology)

## 2024-02-26 NOTE — Telephone Encounter (Signed)
 Patient with insurance change.  Submitted an URGENT Prior Authorization request to [facility] for FASENRA  via CoverMyMeds. Will update once we receive a response.  Key: [id number]  Geraldene Kleine, PharmD, MPH, BCPS, CPP Clinical Pharmacist (Rheumatology and Pulmonology)

## 2024-02-27 ENCOUNTER — Other Ambulatory Visit: Payer: Self-pay

## 2024-03-01 ENCOUNTER — Other Ambulatory Visit: Payer: Self-pay

## 2024-03-08 ENCOUNTER — Ambulatory Visit: Payer: Self-pay | Admitting: Family Medicine

## 2024-03-08 ENCOUNTER — Other Ambulatory Visit: Payer: Self-pay | Admitting: Family Medicine

## 2024-03-08 ENCOUNTER — Encounter: Payer: Self-pay | Admitting: Family Medicine

## 2024-03-08 VITALS — BP 141/85 | HR 64 | Temp 97.9°F | Ht 69.0 in | Wt 187.4 lb

## 2024-03-08 DIAGNOSIS — J0101 Acute recurrent maxillary sinusitis: Secondary | ICD-10-CM

## 2024-03-08 DIAGNOSIS — D649 Anemia, unspecified: Secondary | ICD-10-CM

## 2024-03-08 DIAGNOSIS — R002 Palpitations: Secondary | ICD-10-CM

## 2024-03-08 DIAGNOSIS — Z1211 Encounter for screening for malignant neoplasm of colon: Secondary | ICD-10-CM

## 2024-03-08 DIAGNOSIS — Z79899 Other long term (current) drug therapy: Secondary | ICD-10-CM

## 2024-03-08 DIAGNOSIS — Z130 Encounter for screening for diseases of the blood and blood-forming organs and certain disorders involving the immune mechanism: Secondary | ICD-10-CM

## 2024-03-08 DIAGNOSIS — J3089 Other allergic rhinitis: Secondary | ICD-10-CM

## 2024-03-08 DIAGNOSIS — Z1231 Encounter for screening mammogram for malignant neoplasm of breast: Secondary | ICD-10-CM

## 2024-03-08 DIAGNOSIS — K219 Gastro-esophageal reflux disease without esophagitis: Secondary | ICD-10-CM

## 2024-03-08 DIAGNOSIS — Z13220 Encounter for screening for lipoid disorders: Secondary | ICD-10-CM

## 2024-03-08 MED ORDER — PREDNISONE 20 MG PO TABS: ORAL_TABLET | ORAL | 0 refills | Status: DC

## 2024-03-08 MED ORDER — AMOXICILLIN-POT CLAVULANATE 875-125 MG PO TABS: 1.0000 | ORAL_TABLET | Freq: Two times a day (BID) | ORAL | 0 refills | Status: DC

## 2024-03-08 MED ORDER — FLUTICASONE PROPIONATE 50 MCG/ACT NA SUSP: 2.0000 | Freq: Every day | NASAL | 6 refills | Status: AC

## 2024-03-08 NOTE — Progress Notes (Signed)
 Established patient visit   Patient: Angela Reyes   DOB: December 13, 1960   63 y.o. Female  MRN: [id number] Visit Date: 03/08/2024  Today's healthcare provider: Carlean Charter, DO   Chief Complaint  Patient presents with   Cough    Patient is present here today with a cough, nasal congestion, and scratchy throat, symptoms started early part of January.  Reports she went to see the Pulmonogist for her asthma in either February or March and was prescribed Prednisone  stated it worked but a little bit and now it is reoccurring.  Started being able to blow her nose with green phlegm last week and reports she has pressure with headaches.  Reports no fever.   Subjective    HPI Angela Reyes is a 63 year old female with asthma who presents with sinus congestion and pressure.  She has been experiencing sinus congestion and pressure since January, with symptoms worsening over the past few years. The congestion is sometimes described as 'like glue' and difficult to expel, with occasional yellow discharge. She experiences intermittent ear pain and a dry, scratchy throat with postnasal drip. No fever, chills, or sore throat.  She has a history of asthma and is currently on Fasenra  injections every eight weeks, which she finds effective. She also uses albuterol  via both nebulizer and inhaler, and takes Breo Ellipta  and Zyrtec daily. She previously used montelukast  but is unsure if she is still taking it. Her breathing is currently stable and not affected by her sinus issues. - Occasionally takes Alavert (loratadine) when her symptoms worsen.  In the past, she frequently used prednisone  for asthma exacerbations, which were often triggered by sinusitis. However, since starting Fasenra , her need for prednisone  has decreased. She recalls a recent prescription of a Z-Pak and prednisone  in February for similar symptoms.  No shortness of breath, chest pain, dizziness, or weakness. No history of high blood  pressure or diabetes, and she is unsure about vitamin D deficiency. Occasionally, she experiences heart palpitations but describes them as brief and not concerning.      Medications: Outpatient Medications Prior to Visit  Medication Sig   albuterol  (PROVENTIL ) (2.5 MG/3ML) 0.083% nebulizer solution TAKE 3 MLS BY NEBULIZATION EVERY 6 HOURS AS NEEDED FOR WHEEZING OR SHORTNESS OF BREATH.   albuterol  (VENTOLIN  HFA) 108 (90 Base) MCG/ACT inhaler TAKE 2 PUFFS BY MOUTH EVERY 6 HOURS AS NEEDED FOR WHEEZE OR SHORTNESS OF BREATH   benralizumab  (FASENRA  PEN) 30 MG/ML prefilled autoinjector Inject 30mg  into the skin at Week 8, then every 8 weeks thereafter   BREO ELLIPTA  200-25 MCG/ACT AEPB TAKE 1 PUFF BY MOUTH EVERY DAY   cetirizine (ZYRTEC) 10 MG tablet Take 10 mg by mouth at bedtime. Reported on 05/01/2016   esomeprazole  (NEXIUM ) 20 MG capsule Take 20 mg by mouth daily at 12 noon.   montelukast  (SINGULAIR ) 10 MG tablet TAKE 1 TABLET BY MOUTH EVERYDAY AT BEDTIME   conjugated estrogens  (PREMARIN ) vaginal cream Place 1 Applicatorful vaginally daily. (Patient not taking: Reported on 03/08/2024)   [DISCONTINUED] methylPREDNISolone  (MEDROL  DOSEPAK) 4 MG TBPK tablet Take as directed in the package. (Patient not taking: Reported on 03/08/2024)   No facility-administered medications prior to visit.    Review of Systems  Constitutional:  Negative for appetite change, chills, fatigue and fever.  HENT:  Positive for congestion, ear pain (intermittent), postnasal drip, sinus pressure and sinus pain. Negative for rhinorrhea.   Respiratory:  Negative for chest tightness and shortness of  breath.   Cardiovascular:  Negative for chest pain and palpitations.  Neurological:  Negative for dizziness and weakness.        Objective    BP (!) 141/85 (BP Location: Right Arm, Patient Position: Sitting, Cuff Size: Normal)   Pulse 64   Temp 97.9 F (36.6 C) (Oral)   Ht 5\' 9"  (1.753 m)   Wt 187 lb 6.4 oz (85 kg)   LMP  02/10/2015 Comment: 1st one since dec 2014  SpO2 99%   BMI 27.67 kg/m     Physical Exam Vitals reviewed.  Constitutional:      General: She is not in acute distress.    Appearance: Normal appearance. She is well-developed. She is not diaphoretic.  HENT:     Head: Normocephalic and atraumatic.     Right Ear: Tympanic membrane, ear canal and external ear normal.     Left Ear: Tympanic membrane, ear canal and external ear normal.     Nose: Congestion present.     Mouth/Throat:     Mouth: Mucous membranes are moist.     Pharynx: Oropharynx is clear. No oropharyngeal exudate.  Eyes:     General: No scleral icterus.    Conjunctiva/sclera: Conjunctivae normal.     Pupils: Pupils are equal, round, and reactive to light.  Cardiovascular:     Rate and Rhythm: Normal rate and regular rhythm.     Pulses: Normal pulses.     Heart sounds: Normal heart sounds. No murmur heard. Pulmonary:     Effort: Pulmonary effort is normal. No respiratory distress.     Breath sounds: Normal breath sounds. No wheezing or rales.  Musculoskeletal:     Cervical back: Neck supple.     Right lower leg: No edema.     Left lower leg: No edema.  Lymphadenopathy:     Cervical: No cervical adenopathy.  Skin:    General: Skin is warm and dry.     Findings: No rash.  Neurological:     Mental Status: She is alert.      No results found for any visits on 03/08/24.  Assessment & Plan    Acute recurrent maxillary sinusitis -     Fluticasone  Propionate; Place 2 sprays into both nostrils daily.  Dispense: 16 g; Refill: 6 -     predniSONE ; Take 60mg  PO daily x 2 days, then40mg  PO daily x 2 days, then 20mg  PO daily x 3 days  Dispense: 13 tablet; Refill: 0 -     Amoxicillin -Pot Clavulanate; Take 1 tablet by mouth 2 (two) times daily.  Dispense: 20 tablet; Refill: 0  Non-seasonal allergic rhinitis due to other allergic trigger  Encounter for screening mammogram for breast cancer -     3D Screening Mammogram,  Left and Right; Future  Encounter for colorectal cancer screening -     Cologuard     Bacterial sinusitis Chronic sinusitis with recent exacerbation. Treated with Z-Pak and methylprednisolone  in February by pulmonologist. Plan to treat as bacterial sinusitis with Augmentin  and prednisone . - Prescribe Augmentin . - Prescribe prednisone . - Prescribed fluticasone  nasal spray.  Asthma Well-controlled with Fasenra  injections. No recent exacerbations.  Unclear why patient stopped Singulair ; she only filled it once in May 2023 and started Fasenra  December 2023.   - Recommended patient discuss with pulmonology regarding restarting montelukast  at her next appointment.  Acid reflux Managed with over-the-counter Nexium . No recent exacerbations.  Anemia No current symptoms. Routine screening planned.  General Health Maintenance Routine health maintenance is  due. - Order mammogram. - Order Cologuard. - Schedule annual physical and Pap smear.  Ordered blood work for patient to obtain prior to CPE.   Return in about 3 months (around 06/08/2024) for CPE, pap.      I discussed the assessment and treatment plan with the patient  The patient was provided an opportunity to ask questions and all were answered. The patient agreed with the plan and demonstrated an understanding of the instructions.   The patient was advised to call back or seek an in-person evaluation if the symptoms worsen or if the condition fails to improve as anticipated.    Carlean Charter, DO  [facility] [phone] (phone) [phone] (fax)  [facility]

## 2024-03-08 NOTE — Patient Instructions (Addendum)
 Ask pulmonology about restarting montelukast   Please contact [phone] to schedule your mammogram. You will be asked your location preference to have procedure performed. You have two options listed below.  1) [facility] located at [address] [place], [place] [address] 2) [facility] located at [address] [place], [place] [address]  Upon results being received our office will contact you. As well as all results can be viewed through your MyChart. Please feel free to contact us  if you have any further questions or concerns.

## 2024-04-08 ENCOUNTER — Other Ambulatory Visit (HOSPITAL_COMMUNITY): Payer: Self-pay

## 2024-04-08 ENCOUNTER — Other Ambulatory Visit: Payer: Self-pay

## 2024-04-08 ENCOUNTER — Other Ambulatory Visit: Payer: Self-pay | Admitting: Pulmonary Disease

## 2024-04-08 DIAGNOSIS — D721 Eosinophilia, unspecified: Secondary | ICD-10-CM

## 2024-04-08 DIAGNOSIS — J4550 Severe persistent asthma, uncomplicated: Secondary | ICD-10-CM

## 2024-04-08 MED ORDER — FASENRA PEN 30 MG/ML ~~LOC~~ SOAJ
SUBCUTANEOUS | 1 refills | Status: DC
  Filled 2024-04-08 – 2024-04-26 (×3): qty 1, 56d supply, fill #0
  Filled 2024-06-22: qty 1, 56d supply, fill #1

## 2024-04-08 NOTE — Progress Notes (Signed)
 Specialty Pharmacy Refill Coordination Note  Angela Reyes is a 63 y.o. female contacted today regarding refills of specialty medication(s) Benralizumab  (Fasenra  Pen)   Patient requested Delivery   Delivery date: 04/27/24   Verified address: [address], [place], [place] [address]   Medication will be filled on 04/26/24.    New insurance added via eligibility check

## 2024-04-13 DIAGNOSIS — Z1212 Encounter for screening for malignant neoplasm of rectum: Secondary | ICD-10-CM | POA: Diagnosis not present

## 2024-04-13 DIAGNOSIS — Z1211 Encounter for screening for malignant neoplasm of colon: Secondary | ICD-10-CM | POA: Diagnosis not present

## 2024-04-15 ENCOUNTER — Ambulatory Visit: Admitting: Pulmonary Disease

## 2024-04-15 ENCOUNTER — Telehealth: Payer: Self-pay

## 2024-04-15 ENCOUNTER — Encounter: Payer: Self-pay | Admitting: Pulmonary Disease

## 2024-04-15 VITALS — BP 130/86 | HR 62 | Temp 98.2°F | Ht 69.0 in | Wt 188.6 lb

## 2024-04-15 DIAGNOSIS — J3089 Other allergic rhinitis: Secondary | ICD-10-CM

## 2024-04-15 DIAGNOSIS — J4550 Severe persistent asthma, uncomplicated: Secondary | ICD-10-CM

## 2024-04-15 DIAGNOSIS — D721 Eosinophilia, unspecified: Secondary | ICD-10-CM | POA: Diagnosis not present

## 2024-04-15 DIAGNOSIS — R0602 Shortness of breath: Secondary | ICD-10-CM | POA: Diagnosis not present

## 2024-04-15 LAB — NITRIC OXIDE: Nitric Oxide: 35

## 2024-04-15 NOTE — Progress Notes (Signed)
 Subjective:    Patient ID: Angela Reyes, female    DOB: [birth year], 63 y.o.   MRN: [id number]  Patient Care Team: Donzella Lauraine SAILOR, DO as PCP - General (Family Medicine) Tamea Dedra LITTIE, MD as Consulting Physician (Pulmonary Disease)  Chief Complaint  Patient presents with   Follow-up    No breathing problems.    BACKGROUND/INTERVAL:63 year old lifelong never smoker with severe persistent asthma, eosinophilic type, currently on Fasenra , started on 27 August 2022. Follows up from her visit of 25 December 2023.   HPI Discussed the use of AI scribe software for clinical note transcription with the patient, who gave verbal consent to proceed.  History of Present Illness   Angela Reyes is a 63 year old female with severe persistent asthma who presents for follow-up.  She continues to use Fasenra  and Breo Ellipta  for asthma management. No recent episodes of shortness of breath, wheezing, or cough, indicating good control of her symptoms. Obtaining her inhalers is no longer a challenge.  She had a sinusitis in May treated with Augmentin  and prednisone , no exacerbations since then but it did not affect her asthma.  She recently changed her insurance due to her husband's transition to Medicare and is now under a new policy with [employer]. She is concerned about the potential impact on her medication coverage, particularly for Fasenra , which is expensive.  We discussed that we will forward this to our pharmacy team to ensure smooth transition.  No shortness of breath, wheezing, or cough.  No chest pain.  No lower extremity edema.    DATA 05/15/2022 allergen panel: IgE 5 IU/mL, no allergens identified, Aspergillus antibody negative. 07/23/2022 nitric oxide : 220 ppb 07/23/2022 immunoglobulins: Normal. 07/23/2022 CBC with differential: Eosinophils absolute 1.5 K/uL. 07/23/2022 chest x-ray PA and lateral: No acute cardiopulmonary disease. 08/13/2022 PFTs: FEV1 2.85 L or 92%  predicted, FVC 3.68 L or 92% predicted, FEV1/FVC 77%, no significant bronchodilator response.  Lung volumes at lower limit of normal, diffusion capacity normal.  Essentially within normal limits. 08/14/2022 nitric oxide : 126 ppb 08/27/2022 Fasenra : started 09/23/2022 nitric oxide : 52 ppb 11/04/2022 nitric oxide : 53 ppb 12/30/2022 nitric oxide : 62 ppb 06/06/2023 nitric oxide : 40 ppb 12/25/2023 nitirc oxide: 33 ppb 04/15/2024 nitric oxide : 35 ppb  Review of Systems A 10 point review of systems was performed and it is as noted above otherwise negative.   Patient Active Problem List   Diagnosis Date Noted   Upper respiratory tract infection 05/13/2023   Moderate persistent asthma with exacerbation 12/04/2021   Pain around toenail, left foot 09/03/2021   Acute right-sided low back pain without sciatica 09/03/2021   Toenail fungus 09/03/2021   Allergic rhinitis 05/01/2015   Absolute anemia 03/06/2015   At risk for osteoporosis 03/06/2015   Long term current use of systemic steroids 03/06/2015   Acid reflux 03/06/2015   External hemorrhoid 03/06/2015   Plantar fasciitis 03/06/2015   Severe persistent asthma 01/27/2014   Lung nodule 10/14/2013   Chronic cough 09/12/2013   Asthma, cough variant 06/30/2009   Fatty tumor 12/08/2008   Adiposity 12/06/2008   Anxiety 11/17/2008   Adaptive colitis 11/17/2008   Low back pain 11/17/2008    Social History   Tobacco Use   Smoking status: Never   Smokeless tobacco: Never  Substance Use Topics   Alcohol use: No    No Known Allergies  Current Meds  Medication Sig   albuterol  (PROVENTIL ) (2.5 MG/3ML) 0.083% nebulizer solution TAKE 3 MLS BY NEBULIZATION  EVERY 6 HOURS AS NEEDED FOR WHEEZING OR SHORTNESS OF BREATH.   albuterol  (VENTOLIN  HFA) 108 (90 Base) MCG/ACT inhaler TAKE 2 PUFFS BY MOUTH EVERY 6 HOURS AS NEEDED FOR WHEEZE OR SHORTNESS OF BREATH   benralizumab  (FASENRA  PEN) 30 MG/ML prefilled autoinjector Inject 30mg  into the skin at  Week 8, then every 8 weeks thereafter   BREO ELLIPTA  200-25 MCG/ACT AEPB TAKE 1 PUFF BY MOUTH EVERY DAY   cetirizine (ZYRTEC) 10 MG tablet Take 10 mg by mouth at bedtime. Reported on 05/01/2016   esomeprazole  (NEXIUM ) 20 MG capsule Take 20 mg by mouth daily at 12 noon.   fluticasone  (FLONASE ) 50 MCG/ACT nasal spray Place 2 sprays into both nostrils daily.   montelukast  (SINGULAIR ) 10 MG tablet TAKE 1 TABLET BY MOUTH EVERYDAY AT BEDTIME    Immunization History  Administered Date(s) Administered   Influenza,inj,Quad PF,6+ Mos 11/21/2014, 07/23/2017   Pneumococcal Polysaccharide-23 08/21/2012, 03/03/2013   Tdap 11/21/2014      Objective:     BP 130/86 (BP Location: Left Arm, Patient Position: Sitting, Cuff Size: Large)   Pulse 62   Temp 98.2 F (36.8 C) (Oral)   Ht 5' 9 (1.753 m)   Wt 188 lb 9.6 oz (85.5 kg)   LMP 02/10/2015 Comment: 1st one since dec 2014  SpO2 98%   BMI 27.85 kg/m   SpO2: 98 %  GENERAL: Well-developed, well-nourished woman, no acute distress.  No tachypnea or conversational dyspnea.  Fluent speech.  Mild nasal quality to speech today. HEAD: Normocephalic, atraumatic.  EYES: Pupils equal, round, reactive to light.  No scleral icterus.  MOUTH: Dentition intact, oral mucosa moist.  No thrush. NECK: Supple. No thyromegaly. Trachea midline. No JVD.  No adenopathy. PULMONARY: Good air entry bilaterally.  No adventitious sounds. CARDIOVASCULAR: S1 and S2. Regular rate and rhythm.  No rubs, murmurs or gallops heard. ABDOMEN: Benign. MUSCULOSKELETAL: No joint deformity, no clubbing, no edema.  NEUROLOGIC: No overt focal deficit, no gait disturbance, speech is fluent. SKIN: Intact,warm,dry. PSYCH: Mood and behavior normal.   Lab Results  Component Value Date   NITRICOXIDE 35 04/15/2024  *Mild type II inflammation, value is actually baseline for patient       Assessment & Plan:     ICD-10-CM   1. Severe persistent asthma without complication  J45.50  Nitric oxide     2. Perennial allergic rhinitis  J30.89     3. Eosinophilia, unspecified type  D72.10       Orders Placed This Encounter  Procedures   Nitric oxide    Discussion:    Severe persistent asthma Severe persistent asthma is well-controlled with current treatment regimen. Airway inflammation levels are within her normal range. No symptoms of dyspnea, wheezing, or cough reported. - Continue Fasenra  and Breo Ellipta  as current treatment regimen. - Refer insurance details to pharmacy team to verify continued coverage for Fasenra  due to its high cost. - Schedule follow-up appointment in six months.   Advised if symptoms do not improve or worsen, to please contact office for sooner follow up or seek emergency care.    I spent 32 minutes of dedicated to the care of this patient on the date of this encounter to include pre-visit review of records, face-to-face time with the patient discussing conditions above, post visit ordering of testing, clinical documentation with the electronic health record, making appropriate referrals as documented, and communicating necessary findings to members of the patients care team.     C. Leita Sanders, MD Advanced Bronchoscopy PCCM  [facility]    *This note was generated using voice recognition software/Dragon and/or AI transcription program.  Despite best efforts to proofread, errors can occur which can change the meaning. Any transcriptional errors that result from this process are unintentional and may not be fully corrected at the time of dictation.

## 2024-04-15 NOTE — Telephone Encounter (Signed)
 Patient's PA for Fasenra  was approved through new insurance on 02/26/2024. Should be no issues but if there are, [facility] will notify [facility] directly  Sherry Pennant, PharmD, MPH, BCPS, CPP Clinical Pharmacist (Rheumatology and Pulmonology)

## 2024-04-15 NOTE — Telephone Encounter (Signed)
 [facility] Pharmacy Team, Dr. Tamea saw this patient today. She said her policy number for her [employer] changed. We did get it updated in her chart. Just want to make sure there will not be a problem with getting her Fasenra . Can you confirm that she is still good to go? Thank you!

## 2024-04-15 NOTE — Patient Instructions (Signed)
 VISIT SUMMARY:  You had a follow-up appointment to check on your severe persistent asthma. Your symptoms are well-controlled with your current medications, and you have not experienced any recent episodes of shortness of breath, wheezing, or cough. You also discussed your new insurance policy and its potential impact on your medication coverage.  YOUR PLAN:  -SEVERE PERSISTENT ASTHMA: Severe persistent asthma is a long-term condition where the airways in your lungs are always inflamed and can become narrowed, making it hard to breathe. Your asthma is well-controlled with your current medications, Fasenra  and Breo Ellipta . You should continue using these medications as prescribed. We will refer your insurance details to the pharmacy team to ensure that your new policy continues to cover Fasenra , which is an expensive medication.  INSTRUCTIONS:  Please schedule a follow-up appointment in six months.

## 2024-04-16 LAB — COLOGUARD: COLOGUARD: NEGATIVE

## 2024-04-16 NOTE — Telephone Encounter (Signed)
 Patient advised. NFN.

## 2024-04-19 ENCOUNTER — Ambulatory Visit: Admitting: Pulmonary Disease

## 2024-04-25 ENCOUNTER — Ambulatory Visit: Payer: Self-pay | Admitting: Family Medicine

## 2024-04-26 ENCOUNTER — Other Ambulatory Visit: Payer: Self-pay

## 2024-04-29 ENCOUNTER — Other Ambulatory Visit: Payer: Self-pay | Admitting: Family Medicine

## 2024-04-29 DIAGNOSIS — R928 Other abnormal and inconclusive findings on diagnostic imaging of breast: Secondary | ICD-10-CM

## 2024-04-29 DIAGNOSIS — N6001 Solitary cyst of right breast: Secondary | ICD-10-CM

## 2024-04-29 DIAGNOSIS — R921 Mammographic calcification found on diagnostic imaging of breast: Secondary | ICD-10-CM

## 2024-05-04 ENCOUNTER — Inpatient Hospital Stay
Admission: RE | Admit: 2024-05-04 | Discharge: 2024-05-04 | Discharge status: Home / Self Care | Source: Ambulatory Visit | Attending: Family Medicine | Admitting: Family Medicine

## 2024-05-04 ENCOUNTER — Ambulatory Visit
Admission: RE | Admit: 2024-05-04 | Discharge: 2024-05-04 | Disposition: A | Discharge status: Home / Self Care | Source: Ambulatory Visit | Attending: Family Medicine | Admitting: Family Medicine

## 2024-05-04 DIAGNOSIS — N6001 Solitary cyst of right breast: Secondary | ICD-10-CM | POA: Diagnosis not present

## 2024-05-04 DIAGNOSIS — R921 Mammographic calcification found on diagnostic imaging of breast: Secondary | ICD-10-CM

## 2024-05-04 DIAGNOSIS — R928 Other abnormal and inconclusive findings on diagnostic imaging of breast: Secondary | ICD-10-CM | POA: Diagnosis not present

## 2024-05-04 DIAGNOSIS — R92333 Mammographic heterogeneous density, bilateral breasts: Secondary | ICD-10-CM | POA: Diagnosis not present

## 2024-05-06 ENCOUNTER — Ambulatory Visit: Payer: Self-pay | Admitting: Family Medicine

## 2024-05-27 ENCOUNTER — Other Ambulatory Visit: Payer: Self-pay

## 2024-05-27 DIAGNOSIS — K219 Gastro-esophageal reflux disease without esophagitis: Secondary | ICD-10-CM | POA: Diagnosis not present

## 2024-05-27 DIAGNOSIS — Z130 Encounter for screening for diseases of the blood and blood-forming organs and certain disorders involving the immune mechanism: Secondary | ICD-10-CM | POA: Diagnosis not present

## 2024-05-27 DIAGNOSIS — Z1329 Encounter for screening for other suspected endocrine disorder: Secondary | ICD-10-CM | POA: Diagnosis not present

## 2024-05-27 DIAGNOSIS — Z79899 Other long term (current) drug therapy: Secondary | ICD-10-CM | POA: Diagnosis not present

## 2024-05-27 DIAGNOSIS — D649 Anemia, unspecified: Secondary | ICD-10-CM | POA: Diagnosis not present

## 2024-05-27 DIAGNOSIS — R002 Palpitations: Secondary | ICD-10-CM | POA: Diagnosis not present

## 2024-05-27 DIAGNOSIS — Z13220 Encounter for screening for lipoid disorders: Secondary | ICD-10-CM | POA: Diagnosis not present

## 2024-05-27 DIAGNOSIS — Z13228 Encounter for screening for other metabolic disorders: Secondary | ICD-10-CM | POA: Diagnosis not present

## 2024-05-28 LAB — CBC
Hematocrit: 45.8 % (ref 34.0–46.6)
Hemoglobin: 14.2 g/dL (ref 11.1–15.9)
MCH: 28.3 pg (ref 26.6–33.0)
MCHC: 31 g/dL — ABNORMAL LOW (ref 31.5–35.7)
MCV: 91 fL (ref 79–97)
Platelets: 353 x10E3/uL (ref 150–450)
RBC: 5.02 x10E6/uL (ref 3.77–5.28)
RDW: 13.4 % (ref 11.7–15.4)
WBC: 5.5 x10E3/uL (ref 3.4–10.8)

## 2024-05-28 LAB — COMPREHENSIVE METABOLIC PANEL WITH GFR
ALT: 13 IU/L (ref 0–32)
AST: 18 IU/L (ref 0–40)
Albumin: 4.6 g/dL (ref 3.9–4.9)
Alkaline Phosphatase: 115 IU/L (ref 44–121)
BUN/Creatinine Ratio: 14 (ref 12–28)
BUN: 13 mg/dL (ref 8–27)
Bilirubin Total: 0.3 mg/dL (ref 0.0–1.2)
CO2: 21 mmol/L (ref 20–29)
Calcium: 9.3 mg/dL (ref 8.7–10.3)
Chloride: 102 mmol/L (ref 96–106)
Creatinine, Ser: 0.95 mg/dL (ref 0.57–1.00)
Globulin, Total: 2.7 g/dL (ref 1.5–4.5)
Glucose: 84 mg/dL (ref 70–99)
Potassium: 4.6 mmol/L (ref 3.5–5.2)
Sodium: 141 mmol/L (ref 134–144)
Total Protein: 7.3 g/dL (ref 6.0–8.5)
eGFR: 68 mL/min/1.73 (ref >59)

## 2024-05-28 LAB — LIPID PANEL
Chol/HDL Ratio: 5.1 ratio — ABNORMAL HIGH (ref 0.0–4.4)
Cholesterol, Total: 236 mg/dL — ABNORMAL HIGH (ref 100–199)
HDL: 46 mg/dL
LDL Chol Calc (NIH): 173 mg/dL — ABNORMAL HIGH (ref 0–99)
Triglycerides: 96 mg/dL (ref 0–149)
VLDL Cholesterol Cal: 17 mg/dL (ref 5–40)

## 2024-05-28 LAB — TSH RFX ON ABNORMAL TO FREE T4: TSH: 3.07 u[IU]/mL (ref 0.450–4.500)

## 2024-05-28 LAB — VITAMIN B12: Vitamin B-12: 352 pg/mL (ref 232–1245)

## 2024-06-02 ENCOUNTER — Ambulatory Visit: Payer: Self-pay | Admitting: Family Medicine

## 2024-06-08 ENCOUNTER — Ambulatory Visit: Payer: Self-pay | Admitting: Family Medicine

## 2024-06-08 ENCOUNTER — Encounter: Payer: Self-pay | Admitting: Family Medicine

## 2024-06-08 ENCOUNTER — Other Ambulatory Visit (HOSPITAL_COMMUNITY)
Admission: RE | Admit: 2024-06-08 | Discharge: 2024-06-08 | Disposition: A | Discharge status: Home / Self Care | Source: Ambulatory Visit | Attending: Family Medicine | Admitting: Family Medicine

## 2024-06-08 VITALS — BP 131/82 | HR 65 | Temp 97.7°F | Ht 69.0 in | Wt 187.1 lb

## 2024-06-08 DIAGNOSIS — Z0001 Encounter for general adult medical examination with abnormal findings: Secondary | ICD-10-CM

## 2024-06-08 DIAGNOSIS — M545 Low back pain, unspecified: Secondary | ICD-10-CM | POA: Diagnosis not present

## 2024-06-08 DIAGNOSIS — Z124 Encounter for screening for malignant neoplasm of cervix: Secondary | ICD-10-CM

## 2024-06-08 DIAGNOSIS — E7800 Pure hypercholesterolemia, unspecified: Secondary | ICD-10-CM | POA: Diagnosis not present

## 2024-06-08 DIAGNOSIS — N952 Postmenopausal atrophic vaginitis: Secondary | ICD-10-CM

## 2024-06-08 DIAGNOSIS — Z0000 Encounter for general adult medical examination without abnormal findings: Secondary | ICD-10-CM

## 2024-06-08 DIAGNOSIS — E538 Deficiency of other specified B group vitamins: Secondary | ICD-10-CM

## 2024-06-08 DIAGNOSIS — Z713 Dietary counseling and surveillance: Secondary | ICD-10-CM

## 2024-06-08 DIAGNOSIS — E663 Overweight: Secondary | ICD-10-CM

## 2024-06-08 NOTE — Progress Notes (Signed)
 Complete physical exam   Patient: Angela Reyes   DOB: 03/07/1961   63 y.o. Female  MRN: [id number] Visit Date: 06/08/2024  Today's healthcare provider: LAURAINE LOISE BUOY, DO   Chief Complaint  Patient presents with   Annual Exam    Patient presents for need of CPE and Pap. Patient reports that she is very busy with work, also cares for her mother who is 33 years old.  Diet: Patient reports avoiding carbs most of the time. Otherwise normal.  Exercise: 1-2 days week walking Sleep: Well Overall feeling: Could be better  Vaccines: Patient would like to have time to think about shingrix. Please discuss some side effects if any    Subjective    Angela Reyes is a 63 y.o. female who presents today for a complete physical exam.   HPI HPI     Annual Exam    Additional comments: Patient presents for need of CPE and Pap. Patient reports that she is very busy with work, also cares for her mother who is 19 years old.  Diet: Patient reports avoiding carbs most of the time. Otherwise normal.  Exercise: 1-2 days week walking Sleep: Well Overall feeling: Could be better  Vaccines: Patient would like to have time to think about shingrix. Please discuss some side effects if any       Last edited by Cherry Chiquita HERO, CMA on 06/08/2024  9:32 AM.      Angela Reyes is a 63 year old female who presents for an annual physical exam and Pap smear.  She experiences occasional headaches. She feels fatigued but considers it appropriate given her circumstances.  She reports intermittent lower back pain, which she attributes to prolonged sitting at work.  She has not received a flu shot recently and is uncertain about getting the shingles vaccine. Her father had shingles, and she expresses concern about potential side effects of the vaccine.  She has started taking B12 supplements after being out of town and acknowledges her cholesterol was slightly elevated.  She recently had a normal  mammogram in July. She is sexually active with no concerns about sexually transmitted infections or urinary incontinence. She experiences some dryness but no pain during sexual activity.   Blood work done 05/27/24   Past Medical History:  Diagnosis Date   Asthma    Chronic rhinosinusitis    GERD (gastroesophageal reflux disease)    History of dysplastic nevus 04/26/2010   left mid to upper back, mod to severe/excised 06/13/10   History of dysplastic nevus 01/07/2018   right mid medial dorsum scapula, severe   History of gestational diabetes    History of hiatal hernia    OSA (obstructive sleep apnea)    non-compliant cpap   Pulmonary nodule, right    middle lobe-- stable low risk--  pulmologist,  dr geronimo   Right ureteral stone    Past Surgical History:  Procedure Laterality Date   CYSTOSCOPY W/ URETERAL STENT PLACEMENT Right 06/07/2015   Procedure: CYSTOSCOPY WITH RETROGRADE PYELOGRAM/ STONE EXTRACTION AND URETERAL STENT PLACEMENT;  Surgeon: Belvie LITTIE Clara, MD;  Location: [facility];  Service: Urology;  Laterality: Right;   EXTRACORPOREAL SHOCK WAVE LITHOTRIPSY Right 05-15-2015   HOLMIUM LASER APPLICATION Right 06/07/2015   Procedure: HOLMIUM LASER APPLICATION;  Surgeon: Belvie LITTIE Clara, MD;  Location: [facility];  Service: Urology;  Laterality: Right;   TONSILLECTOMY  age 60   WISDOM TOOTH EXTRACTION  age  22   Social History   Socioeconomic History   Marital status: Married    Spouse name: Not on file   Number of children: Not on file   Years of education: Not on file   Highest education level: Not on file  Occupational History   Not on file  Tobacco Use   Smoking status: Never   Smokeless tobacco: Never  Substance and Sexual Activity   Alcohol use: No   Drug use: No   Sexual activity: Yes    Comment: friend has vasctomy  Other Topics Concern   Not on file  Social History Narrative   Not on file   Social Drivers of Health    Financial Resource Strain: Not on file  Food Insecurity: Not on file  Transportation Needs: Not on file  Physical Activity: Not on file  Stress: Not on file  Social Connections: Not on file  Intimate Partner Violence: Not on file   Family Status  Relation Name Status   Mother  Alive   Father  Alive   Sister  Deceased at age 18       complication of stroke   Brother 1 Alive   MGM  Deceased   MGF  Deceased   PGM  Deceased   PGF  Deceased   Brother 2 Alive   Other  (Not Specified)   Neg Hx  (Not Specified)  No partnership data on file   Family History  Problem Relation Age of Onset   Hypertension Mother    Hypertension Father    Kidney disease Brother    Hypertension Maternal Grandmother    Cancer Paternal Grandfather    Allergies Other        siblings   Breast cancer Neg Hx    No Known Allergies  Patient Care Team: Jamia Hoban, Lauraine SAILOR, DO as PCP - General (Family Medicine) Tamea Dedra CROME, MD as Consulting Physician (Pulmonary Disease)   Medications: Outpatient Medications Prior to Visit  Medication Sig   albuterol  (PROVENTIL ) (2.5 MG/3ML) 0.083% nebulizer solution TAKE 3 MLS BY NEBULIZATION EVERY 6 HOURS AS NEEDED FOR WHEEZING OR SHORTNESS OF BREATH.   albuterol  (VENTOLIN  HFA) 108 (90 Base) MCG/ACT inhaler TAKE 2 PUFFS BY MOUTH EVERY 6 HOURS AS NEEDED FOR WHEEZE OR SHORTNESS OF BREATH   benralizumab  (FASENRA  PEN) 30 MG/ML prefilled autoinjector Inject 30mg  into the skin at Week 8, then every 8 weeks thereafter   BREO ELLIPTA  200-25 MCG/ACT AEPB TAKE 1 PUFF BY MOUTH EVERY DAY   cetirizine (ZYRTEC) 10 MG tablet Take 10 mg by mouth at bedtime. Reported on 05/01/2016   esomeprazole  (NEXIUM ) 20 MG capsule Take 20 mg by mouth daily at 12 noon.   fluticasone  (FLONASE ) 50 MCG/ACT nasal spray Place 2 sprays into both nostrils daily.   montelukast  (SINGULAIR ) 10 MG tablet TAKE 1 TABLET BY MOUTH EVERYDAY AT BEDTIME   [DISCONTINUED] amoxicillin -clavulanate (AUGMENTIN ) 875-125  MG tablet Take 1 tablet by mouth 2 (two) times daily.   [DISCONTINUED] conjugated estrogens  (PREMARIN ) vaginal cream Place 1 Applicatorful vaginally daily.   [DISCONTINUED] predniSONE  (DELTASONE ) 20 MG tablet Take 60mg  PO daily x 2 days, then40mg  PO daily x 2 days, then 20mg  PO daily x 3 days   No facility-administered medications prior to visit.    Review of Systems  Constitutional:  Negative for chills, fatigue and fever.  HENT:  Negative for congestion, ear pain, rhinorrhea, sneezing and sore throat.   Eyes: Negative.  Negative for pain and redness.  Respiratory:  Negative for cough, shortness of breath and wheezing.   Cardiovascular:  Negative for chest pain and leg swelling.  Gastrointestinal:  Negative for abdominal pain, blood in stool, constipation, diarrhea and nausea.  Endocrine: Negative for polydipsia and polyphagia.  Genitourinary: Negative.  Negative for dysuria, flank pain, hematuria, pelvic pain, vaginal bleeding and vaginal discharge.  Musculoskeletal:  Negative for arthralgias, back pain, gait problem and joint swelling.  Skin:  Negative for rash.  Neurological: Negative.  Negative for dizziness, tremors, seizures, weakness, light-headedness, numbness and headaches.  Hematological:  Negative for adenopathy.  Psychiatric/Behavioral: Negative.  Negative for behavioral problems, confusion and dysphoric mood. The patient is not nervous/anxious and is not hyperactive.       Objective    BP 131/82 (BP Location: Left Arm, Patient Position: Sitting, Cuff Size: Normal)   Pulse 65   Temp 97.7 F (36.5 C) (Oral)   Ht 5' 9 (1.753 m)   Wt 187 lb 1.6 oz (84.9 kg)   LMP 02/10/2015 Comment: 1st one since dec 2014  SpO2 99%   BMI 27.63 kg/m    Physical Exam Vitals and nursing note reviewed.  Constitutional:      General: She is awake.     Appearance: Normal appearance.  HENT:     Head: Normocephalic and atraumatic.     Right Ear: Tympanic membrane, ear canal and  external ear normal.     Left Ear: Tympanic membrane, ear canal and external ear normal.     Nose: Nose normal.     Mouth/Throat:     Mouth: Mucous membranes are moist.     Pharynx: Oropharynx is clear. No oropharyngeal exudate or posterior oropharyngeal erythema.  Eyes:     General: No scleral icterus.    Extraocular Movements: Extraocular movements intact.     Conjunctiva/sclera: Conjunctivae normal.     Pupils: Pupils are equal, round, and reactive to light.  Neck:     Thyroid: No thyromegaly or thyroid tenderness.  Cardiovascular:     Rate and Rhythm: Normal rate and regular rhythm.     Pulses: Normal pulses.     Heart sounds: Normal heart sounds.  Pulmonary:     Effort: Pulmonary effort is normal. No tachypnea, bradypnea or respiratory distress.     Breath sounds: Normal breath sounds. No stridor. No wheezing, rhonchi or rales.  Abdominal:     General: Bowel sounds are normal. There is no distension.     Palpations: Abdomen is soft. There is no mass.     Tenderness: There is no abdominal tenderness. There is no guarding.     Hernia: No hernia is present.  Genitourinary:    General: Normal vulva.     Exam position: Lithotomy position.     Pubic Area: No rash.   Musculoskeletal:     Cervical back: Normal range of motion and neck supple.     Right lower leg: No edema.     Left lower leg: No edema.  Lymphadenopathy:     Cervical: No cervical adenopathy.  Skin:    General: Skin is warm and dry.  Neurological:     Mental Status: She is alert and oriented to person, place, and time. Mental status is at baseline.  Psychiatric:        Mood and Affect: Mood normal.        Behavior: Behavior normal.      The 10-year ASCVD risk score (Arnett DK, et al., 2019) is: 4.8%   Values used to calculate  the score:     Age: 61 years     Clincally relevant sex: Female     Is Non-Hispanic African American: No     Diabetic: No     Tobacco smoker: No     Systolic Blood Pressure: 131  mmHg     Is BP treated: No     HDL Cholesterol: 46 mg/dL     Total Cholesterol: 236 mg/dL   Last depression screening scores    06/08/2024    9:35 AM 03/08/2024    8:25 AM 04/18/2022   11:13 AM  PHQ 2/9 Scores  PHQ - 2 Score 0 0 0  PHQ- 9 Score 1 3 5    Last fall risk screening    06/08/2024    9:35 AM  Fall Risk   Falls in the past year? 0  Number falls in past yr: 0  Risk for fall due to : No Fall Risks  Follow up Falls evaluation completed   Last Audit-C alcohol use screening    04/18/2022   11:13 AM  Alcohol Use Disorder Test (AUDIT)  1. How often do you have a drink containing alcohol? 1  2. How many drinks containing alcohol do you have on a typical day when you are drinking? 0  3. How often do you have six or more drinks on one occasion? 0  AUDIT-C Score 1   A score of 3 or more in women, and 4 or more in men indicates increased risk for alcohol abuse, EXCEPT if all of the points are from question 1   No results found for any visits on 06/08/24.  Assessment & Plan    Routine Health Maintenance and Physical Exam  Exercise Activities and Dietary recommendations  Goals      Maintain pulmonary function as close to normal as possible     Patient is on track . Patient will maintain adherence         Immunization History  Administered Date(s) Administered   Influenza,inj,Quad PF,6+ Mos 11/21/2014, 07/23/2017   Pneumococcal Polysaccharide-23 08/21/2012, 03/03/2013   Tdap 11/21/2014    Health Maintenance  Topic Date Due   Hepatitis C Screening  Never done   Cervical Cancer Screening (HPV/Pap Cotest)  11/21/2017   COVID-19 Vaccine (1) 06/24/2024 (Originally 08/29/1966)   Zoster Vaccines- Shingrix (1 of 2) 09/08/2024 (Originally 08/28/1981)   INFLUENZA VACCINE  01/18/2025 (Originally 05/21/2024)   Pneumococcal Vaccine: 50+ Years (2 of 2 - PCV) 06/08/2025 (Originally 03/03/2014)   DTaP/Tdap/Td (2 - Td or Tdap) 11/21/2024   MAMMOGRAM  05/04/2026   Fecal DNA  (Cologuard)  04/14/2027   HIV Screening  Completed   Hepatitis B Vaccines 19-59 Average Risk  Aged Out   HPV VACCINES  Aged Out   Meningococcal B Vaccine  Aged Out   Pneumococcal Vaccine  Discontinued    Discussed health benefits of physical activity, and encouraged her to engage in regular exercise appropriate for her age and condition.   Annual physical exam  Pap smear for cervical cancer screening -     Cytology - PAP  Intermittent low back pain  Elevated LDL cholesterol level  Vitamin B12 deficiency  Overweight (BMI 25.0-29.9)  Weight loss counseling, encounter for  Postmenopausal atrophic vaginitis      Annual physical exam; Pap smear Routine wellness visit with no significant symptoms. Physical exam overall unremarkable except as noted above. Routine lab work obtained prior to visit. Occasional headaches and fatigue noted. Lower back pain due to prolonged  sitting. Regular eye and dental exams. Normal mammogram in July.  Counseled patient regarding the shingles vaccine; she declines for the time being, wanting to think about it more. - Perform Pap smear. - Conduct physical examination. - Encourage stretching exercises for lower back pain.  Low back pain Chronic intermittent lower back pain due to prolonged sitting, likely from tight hip flexors. - Recommend stretching exercises for hip flexors.  Elevated LDL Slightly elevated cholesterol with risk score under 5%, not requiring medication. Dietary modifications advised. - Advise dietary changes to reduce cholesterol, including reducing carbs and increasing vegetable intake.  Vitamin B12 deficiency Vitamin B12 deficiency identified. She plans to start B12 supplements. - Start B12 supplementation.  Overweight (BMI 25.0-29.9); weight loss counseling Discussed weight management options, including GLP-1 receptor agonists and topiramate. Potential side effects and weight regain post-medication discussed. She is  considering options. - Consider GLP-1 receptor agonists for weight management. - Consider topiramate for appetite suppression.  Postmenopausal atrophic vaginitis Reports vaginal dryness without dyspareunia.  Vaginal lubricant effective for patient.  No history of abnormal Pap smears.    Return in about 1 year (around 06/08/2025) for CPE.     I discussed the assessment and treatment plan with the patient  The patient was provided an opportunity to ask questions and all were answered. The patient agreed with the plan and demonstrated an understanding of the instructions.   The patient was advised to call back or seek an in-person evaluation if the symptoms worsen or if the condition fails to improve as anticipated.    LAURAINE LOISE BUOY, DO  [facility] [phone] (phone) [phone] (fax)  [facility]

## 2024-06-10 LAB — CYTOLOGY - PAP
Comment: NEGATIVE
Diagnosis: UNDETERMINED — AB
High risk HPV: NEGATIVE

## 2024-06-14 ENCOUNTER — Other Ambulatory Visit: Payer: Self-pay

## 2024-06-14 ENCOUNTER — Ambulatory Visit: Payer: Self-pay | Admitting: Family Medicine

## 2024-06-17 ENCOUNTER — Other Ambulatory Visit: Payer: Self-pay

## 2024-06-18 ENCOUNTER — Other Ambulatory Visit: Payer: Self-pay

## 2024-06-22 ENCOUNTER — Other Ambulatory Visit: Payer: Self-pay

## 2024-06-22 ENCOUNTER — Other Ambulatory Visit (HOSPITAL_COMMUNITY): Payer: Self-pay

## 2024-06-22 NOTE — Progress Notes (Signed)
 Specialty Pharmacy Refill Coordination Note  Spoke with Angela Reyes is a 63 y.o. female contacted today regarding refills of specialty medication(s) Benralizumab  (Fasenra  Pen)  Doses on hand: 0  Injection date: 06/29/24   Patient requested: Delivery   Delivery date: 06/24/24   Verified address: [address] [place]  [address]  Medication will be filled on 06/23/24.

## 2024-06-23 ENCOUNTER — Other Ambulatory Visit: Payer: Self-pay

## 2024-06-24 ENCOUNTER — Other Ambulatory Visit: Payer: Self-pay

## 2024-08-13 ENCOUNTER — Other Ambulatory Visit: Payer: Self-pay

## 2024-08-13 ENCOUNTER — Other Ambulatory Visit: Payer: Self-pay | Admitting: Pulmonary Disease

## 2024-08-13 DIAGNOSIS — D721 Eosinophilia, unspecified: Secondary | ICD-10-CM

## 2024-08-13 DIAGNOSIS — J4550 Severe persistent asthma, uncomplicated: Secondary | ICD-10-CM

## 2024-08-13 MED ORDER — FASENRA PEN 30 MG/ML ~~LOC~~ SOAJ
30.0000 mg | SUBCUTANEOUS | 2 refills | Status: DC
  Filled 2024-08-13: qty 1, 56d supply, fill #0
  Filled 2024-09-30 – 2024-10-01 (×2): qty 1, 56d supply, fill #1

## 2024-08-13 NOTE — Progress Notes (Signed)
 Specialty Pharmacy Refill Coordination Note  Angela Reyes is a 63 y.o. female contacted today regarding refills of specialty medication(s) Benralizumab  (Fasenra  Pen)   Patient requested Delivery   Delivery date: 08/19/24   Verified address: [address] [place] [address]   Medication will be filled on 08/18/24.     This fill date is pending response to refill request from provider. Patient is aware and if they have not received fill by intended date they must follow up with pharmacy.

## 2024-08-13 NOTE — Telephone Encounter (Signed)
 Refill sent for FASENRA  to [facility]: [phone]   Dose: 30mg  [place] every 8 weeks   Last OV: 04/15/24 Provider: Dr. Tamea  Next OV: due December, not yet scheduled  Routing to scheduling team for follow-up on appt scheduling  Aleck Puls, PharmD, BCPS Clinical Pharmacist  [facility]

## 2024-08-16 ENCOUNTER — Other Ambulatory Visit: Payer: Self-pay

## 2024-08-16 ENCOUNTER — Other Ambulatory Visit (HOSPITAL_COMMUNITY): Payer: Self-pay

## 2024-08-27 ENCOUNTER — Other Ambulatory Visit: Payer: Self-pay | Admitting: Pulmonary Disease

## 2024-08-31 DIAGNOSIS — M5416 Radiculopathy, lumbar region: Secondary | ICD-10-CM | POA: Diagnosis not present

## 2024-08-31 DIAGNOSIS — M9905 Segmental and somatic dysfunction of pelvic region: Secondary | ICD-10-CM | POA: Diagnosis not present

## 2024-08-31 DIAGNOSIS — M5417 Radiculopathy, lumbosacral region: Secondary | ICD-10-CM | POA: Diagnosis not present

## 2024-08-31 DIAGNOSIS — M9903 Segmental and somatic dysfunction of lumbar region: Secondary | ICD-10-CM | POA: Diagnosis not present

## 2024-09-01 DIAGNOSIS — M5417 Radiculopathy, lumbosacral region: Secondary | ICD-10-CM | POA: Diagnosis not present

## 2024-09-01 DIAGNOSIS — M9903 Segmental and somatic dysfunction of lumbar region: Secondary | ICD-10-CM | POA: Diagnosis not present

## 2024-09-01 DIAGNOSIS — M9905 Segmental and somatic dysfunction of pelvic region: Secondary | ICD-10-CM | POA: Diagnosis not present

## 2024-09-01 DIAGNOSIS — M5416 Radiculopathy, lumbar region: Secondary | ICD-10-CM | POA: Diagnosis not present

## 2024-09-03 DIAGNOSIS — M5417 Radiculopathy, lumbosacral region: Secondary | ICD-10-CM | POA: Diagnosis not present

## 2024-09-03 DIAGNOSIS — M5416 Radiculopathy, lumbar region: Secondary | ICD-10-CM | POA: Diagnosis not present

## 2024-09-03 DIAGNOSIS — M9903 Segmental and somatic dysfunction of lumbar region: Secondary | ICD-10-CM | POA: Diagnosis not present

## 2024-09-03 DIAGNOSIS — M9905 Segmental and somatic dysfunction of pelvic region: Secondary | ICD-10-CM | POA: Diagnosis not present

## 2024-09-06 DIAGNOSIS — M5417 Radiculopathy, lumbosacral region: Secondary | ICD-10-CM | POA: Diagnosis not present

## 2024-09-06 DIAGNOSIS — M9905 Segmental and somatic dysfunction of pelvic region: Secondary | ICD-10-CM | POA: Diagnosis not present

## 2024-09-06 DIAGNOSIS — M5416 Radiculopathy, lumbar region: Secondary | ICD-10-CM | POA: Diagnosis not present

## 2024-09-06 DIAGNOSIS — M9903 Segmental and somatic dysfunction of lumbar region: Secondary | ICD-10-CM | POA: Diagnosis not present

## 2024-09-07 DIAGNOSIS — M5416 Radiculopathy, lumbar region: Secondary | ICD-10-CM | POA: Diagnosis not present

## 2024-09-07 DIAGNOSIS — M9905 Segmental and somatic dysfunction of pelvic region: Secondary | ICD-10-CM | POA: Diagnosis not present

## 2024-09-07 DIAGNOSIS — M9903 Segmental and somatic dysfunction of lumbar region: Secondary | ICD-10-CM | POA: Diagnosis not present

## 2024-09-07 DIAGNOSIS — M5417 Radiculopathy, lumbosacral region: Secondary | ICD-10-CM | POA: Diagnosis not present

## 2024-09-14 DIAGNOSIS — M9905 Segmental and somatic dysfunction of pelvic region: Secondary | ICD-10-CM | POA: Diagnosis not present

## 2024-09-14 DIAGNOSIS — M5417 Radiculopathy, lumbosacral region: Secondary | ICD-10-CM | POA: Diagnosis not present

## 2024-09-14 DIAGNOSIS — M5416 Radiculopathy, lumbar region: Secondary | ICD-10-CM | POA: Diagnosis not present

## 2024-09-14 DIAGNOSIS — M9903 Segmental and somatic dysfunction of lumbar region: Secondary | ICD-10-CM | POA: Diagnosis not present

## 2024-09-21 DIAGNOSIS — M9905 Segmental and somatic dysfunction of pelvic region: Secondary | ICD-10-CM | POA: Diagnosis not present

## 2024-09-21 DIAGNOSIS — M5416 Radiculopathy, lumbar region: Secondary | ICD-10-CM | POA: Diagnosis not present

## 2024-09-21 DIAGNOSIS — M9903 Segmental and somatic dysfunction of lumbar region: Secondary | ICD-10-CM | POA: Diagnosis not present

## 2024-09-21 DIAGNOSIS — M5417 Radiculopathy, lumbosacral region: Secondary | ICD-10-CM | POA: Diagnosis not present

## 2024-09-27 ENCOUNTER — Ambulatory Visit: Payer: Self-pay

## 2024-09-27 ENCOUNTER — Ambulatory Visit (INDEPENDENT_AMBULATORY_CARE_PROVIDER_SITE_OTHER): Admitting: Family Medicine

## 2024-09-27 ENCOUNTER — Encounter: Payer: Self-pay | Admitting: Family Medicine

## 2024-09-27 VITALS — BP 147/77 | HR 69 | Temp 97.9°F | Ht 69.0 in | Wt 194.0 lb

## 2024-09-27 DIAGNOSIS — M545 Low back pain, unspecified: Secondary | ICD-10-CM | POA: Diagnosis not present

## 2024-09-27 MED ORDER — TIZANIDINE HCL 4 MG PO TABS: 4.0000 mg | ORAL_TABLET | Freq: Four times a day (QID) | ORAL | 0 refills | Status: DC | PRN

## 2024-09-27 MED ORDER — MELOXICAM 7.5 MG PO TABS: 7.5000 mg | ORAL_TABLET | Freq: Every day | ORAL | 0 refills | Status: AC

## 2024-09-27 NOTE — Patient Instructions (Signed)
              To keep you healthy, please keep in mind the following health maintenance items that you are due for:   Health Maintenance Due  Topic Date Due   Hepatitis C Screening  Never done   Zoster Vaccines- Shingrix (1 of 2) Never done     Best Wishes,   Dr. Lang

## 2024-09-27 NOTE — Telephone Encounter (Signed)
 FYI Only or Action Required?: FYI only for provider: appointment scheduled on this afternoon.  Patient was last seen in primary care on 06/08/2024 by Donzella Lauraine SAILOR, DO.  Called Nurse Triage reporting Back Pain.  Symptoms began several weeks ago.  Interventions attempted: Other: Went to chiropractor.  Symptoms are: unchanged.  Triage Disposition: See PCP When Office is Open (Within 3 Days)  Patient/caregiver understands and will follow disposition?: Yes                        Copied from CRM #[id number]. Topic: Clinical - Red Word Triage >> Sep 27, 2024 10:37 AM Amy B wrote: Red Word that prompted transfer to Nurse Triage: Pain in lower left back x 3 weeks Reason for Disposition  [1] MODERATE back pain (e.g., interferes with normal activities) AND [2] present > 3 days  Answer Assessment - Initial Assessment Questions 1. ONSET: When did the pain begin? (e.g., minutes, hours, days)     Left side 3 weeks 2. LOCATION: Where does it hurt? (upper, mid or lower back)     Left side 3. SEVERITY: How bad is the pain?  (e.g., Scale 1-10; mild, moderate, or severe)     Today a 5/10 - other times an 8-9/10 4. PATTERN: Is the pain constant? (e.g., yes, no; constant, intermittent)      Throbbing pain lower left middle - constant 5. RADIATION: Does the pain shoot into your legs or somewhere else?     no 6. CAUSE:  What do you think is causing the back pain?      Unsure - thinks it might  7. BACK OVERUSE:  Any recent lifting of heavy objects, strenuous work or exercise?     no 8. MEDICINES: What have you taken so far for the pain? (e.g., nothing, acetaminophen , NSAIDS)     Salon pas and heating pad 9. NEUROLOGIC SYMPTOMS: Do you have any weakness, numbness, or problems with bowel/bladder control?     no 10. OTHER SYMPTOMS: Do you have any other symptoms? (e.g., fever, abdomen pain, burning with urination, blood in urine)       no  Protocols used: Back  Pain-A-AH

## 2024-09-27 NOTE — Telephone Encounter (Signed)
 Patient call note and symptoms reviewed. Agree with scheduled appt. Will evaluate during OV

## 2024-09-27 NOTE — Progress Notes (Signed)
 ACUTE VISIT   Patient: Angela Reyes   DOB: Aug 03, 1961   63 y.o. Female  MRN: [id number]  PCP: Donzella Lauraine SAILOR, DO  Chief Complaint  Patient presents with   Back Pain    Patient is present with back pain x 3 weeks, lower/ middle left side no radiation to other areas. Does not recall injury, using chiropractor but no relief, HX of kidney stones     Subjective    HPI HPI     Back Pain    Additional comments: Patient is present with back pain x 3 weeks, lower/ middle left side no radiation to other areas. Does not recall injury, using chiropractor but no relief, HX of kidney stones        Last edited by Cherry Chiquita HERO, CMA on 09/27/2024 12:57 PM.       Discussed the use of AI scribe software for clinical note transcription with the patient, who gave verbal consent to proceed.  History of Present Illness Angela Reyes is a 63 year old female who presents with persistent back pain for three weeks.  She has been experiencing a low-grade throbbing sensation primarily located in the left side of her back. The pain began suddenly, and she is unsure of the cause, though she speculates she may have 'tweaked it somehow'.  She has visited a chiropractor five times, but the treatments have not provided relief. The pain has been variable, with some days being better than others. On Saturday, she could hardly stand up straight due to the severity of the pain, but it improved slightly by the following day.  No numbness, tingling, or issues with bladder or bowel control. The pain does not radiate down her legs and is not associated with any buttock pain. She has been using a heating pad and Salonpas patches for relief, and she takes Tylenol  for pain management. She has not been taking any NSAIDs recently but has started using Mobic  (meloxicam ) for pain relief.  She recalls having kidney stones several years ago and initially worried that her current pain might be related, but she notes  that the pain is different in nature and location. During the review of symptoms, she denies any electrical pain or unusual sensations during leg raises. She experiences some pulling sensation when lying on her back and pulling her knees to her chest.     Medications: Outpatient Medications Prior to Visit  Medication Sig   albuterol  (PROVENTIL ) (2.5 MG/3ML) 0.083% nebulizer solution TAKE 3 MLS BY NEBULIZATION EVERY 6 HOURS AS NEEDED FOR WHEEZING OR SHORTNESS OF BREATH.   albuterol  (VENTOLIN  HFA) 108 (90 Base) MCG/ACT inhaler TAKE 2 PUFFS BY MOUTH EVERY 6 HOURS AS NEEDED FOR WHEEZE OR SHORTNESS OF BREATH   benralizumab  (FASENRA  PEN) 30 MG/ML prefilled autoinjector Inject 1 mL (30 mg total) into the skin every 8 (eight) weeks.   BREO ELLIPTA  200-25 MCG/ACT AEPB TAKE 1 PUFF BY MOUTH EVERY DAY   cetirizine (ZYRTEC) 10 MG tablet Take 10 mg by mouth at bedtime. Reported on 05/01/2016   esomeprazole  (NEXIUM ) 20 MG capsule Take 20 mg by mouth daily at 12 noon.   fluticasone  (FLONASE ) 50 MCG/ACT nasal spray Place 2 sprays into both nostrils daily.   [DISCONTINUED] montelukast  (SINGULAIR ) 10 MG tablet TAKE 1 TABLET BY MOUTH EVERYDAY AT BEDTIME   No facility-administered medications prior to visit.    Last CBC Lab Results  Component Value Date   WBC 5.5 05/27/2024  HGB 14.2 05/27/2024   HCT 45.8 05/27/2024   MCV 91 05/27/2024   MCH 28.3 05/27/2024   RDW 13.4 05/27/2024   PLT 353 05/27/2024   Last metabolic panel Lab Results  Component Value Date   GLUCOSE 84 05/27/2024   NA 141 05/27/2024   K 4.6 05/27/2024   CL 102 05/27/2024   CO2 21 05/27/2024   BUN 13 05/27/2024   CREATININE 0.95 05/27/2024   EGFR 68 05/27/2024   CALCIUM 9.3 05/27/2024   PROT 7.3 05/27/2024   ALBUMIN 4.6 05/27/2024   LABGLOB 2.7 05/27/2024   BILITOT 0.3 05/27/2024   ALKPHOS 115 05/27/2024   AST 18 05/27/2024   ALT 13 05/27/2024   ANIONGAP 9 05/14/2021        Objective    BP (!) 147/77 (BP Location:  Right Arm, Patient Position: Sitting, Cuff Size: Normal)   Pulse 69   Temp 97.9 F (36.6 C) (Oral)   Ht 5' 9 (1.753 m)   Wt 194 lb (88 kg)   LMP 02/10/2015 Comment: 1st one since dec 2014  SpO2 100%   BMI 28.65 kg/m  BP Readings from Last 3 Encounters:  09/27/24 (!) 147/77  06/08/24 131/82  04/15/24 130/86   Wt Readings from Last 3 Encounters:  09/27/24 194 lb (88 kg)  06/08/24 187 lb 1.6 oz (84.9 kg)  04/15/24 188 lb 9.6 oz (85.5 kg)      Physical Exam   Physical Exam MUSCULOSKELETAL: Pain with flexion of the spine, no pain with extension. Normal range of motion with lateral rotation, abduction, and adduction. Spasms in paraspinal muscles. SKIN: No skin deformities, swelling, or erythema in the lumbar region.   No results found for any visits on 09/27/24.   Assessment & Plan     Assessment and Plan Assessment & Plan Low back pain Chronic low back pain for three weeks, primarily in the left myofascial region, with tenderness and spasms in paraspinal muscles. No radicular symptoms or neurological deficits. Pain exacerbated by activity, relieved by rest and heat. Differential includes musculoskeletal strain versus renal etiology. Previous imaging from 2008 was normal. - Prescribed meloxicam  7.5 mg daily, with option to increase to 15 mg if needed, avoiding other NSAIDs. - Prescribed tizanidine  4 mg every six hours as needed for muscle relaxation. - Continue using heating pad and topical patches for symptomatic relief. - Advised gentle stretching and avoidance of heavy lifting or strenuous activity. - Will consider imaging if symptoms persist or worsen, with referral to appropriate specialist if fracture is suspected. - Follow up with Doctor Pardue next month if symptoms persist.      Return in about 1 month (around 10/28/2024) for lumbar back pain .        Rockie Agent, MD  [facility] [phone] (phone) [phone]  (fax)  [facility]

## 2024-09-30 ENCOUNTER — Other Ambulatory Visit: Payer: Self-pay

## 2024-10-01 ENCOUNTER — Other Ambulatory Visit: Payer: Self-pay

## 2024-10-01 NOTE — Progress Notes (Signed)
 Specialty Pharmacy Refill Coordination Note  Angela Reyes is a 63 y.o. female contacted today regarding refills of specialty medication(s) Benralizumab  (Fasenra  Pen)   Patient requested Delivery   Delivery date: 10/12/24   Verified address: [address] [place] [address]   Medication will be filled on: 10/11/24

## 2024-10-01 NOTE — Progress Notes (Signed)
 Specialty Pharmacy Ongoing Clinical Assessment Note  Angela Reyes is a 63 y.o. female who is being followed by the specialty pharmacy service for RxSp Asthma/COPD   Patient's specialty medication(s) reviewed today: Benralizumab  (Fasenra  Pen)   Missed doses in the last 4 weeks: 0   Patient/Caregiver did not have any additional questions or concerns.   Therapeutic benefit summary: Patient is achieving benefit   Adverse events/side effects summary: No adverse events/side effects   Patient's therapy is appropriate to: Continue    Goals Addressed             This Visit's Progress    Maintain pulmonary function as close to normal as possible   On track    Patient is on track . Patient will maintain adherence         Follow up: 12 months  Paquita Printy M Josaphine Shimamoto Specialty Pharmacist

## 2024-10-04 ENCOUNTER — Encounter: Payer: Self-pay | Admitting: Physician Assistant

## 2024-10-04 ENCOUNTER — Ambulatory Visit (INDEPENDENT_AMBULATORY_CARE_PROVIDER_SITE_OTHER): Admitting: Physician Assistant

## 2024-10-04 VITALS — BP 120/63 | HR 72 | Resp 14 | Ht 69.0 in | Wt 194.6 lb

## 2024-10-04 DIAGNOSIS — M545 Low back pain, unspecified: Secondary | ICD-10-CM | POA: Diagnosis not present

## 2024-10-04 MED ORDER — PREDNISONE 20 MG PO TABS: 20.0000 mg | ORAL_TABLET | Freq: Every day | ORAL | 0 refills | Status: DC

## 2024-10-04 NOTE — Progress Notes (Signed)
 Established patient visit  Patient: Angela Reyes   DOB: [birth year]   63 y.o. Female  MRN: [id number] Visit Date: 10/04/2024  Today's healthcare provider: Jolynn Spencer, PA-C   Chief Complaint  Patient presents with   Acute Visit    Lower left back pain x 4 weeks  Saturday pain return. Pt was seen 09/27/24 did improved. Denies injuries. Would like to consider x-rays   Subjective     HPI     Acute Visit    Additional comments: Lower left back pain x 4 weeks  Saturday pain return. Pt was seen 09/27/24 did improved. Denies injuries. Would like to consider x-rays      Last edited by Wilfred Hargis RAMAN, CMA on 10/04/2024 10:07 AM.       Discussed the use of AI scribe software for clinical note transcription with the patient, who gave verbal consent to proceed.  History of Present Illness Angela Reyes is a 63 year old female with a history of back pain who presents with acute exacerbation of left-sided lower back pain.  She developed a twinge in the left lower back after bending over on Saturday, followed by significant pain with sitting and with lying down and moving her legs. Pain is localized to the left lower back, extending toward the spine and into the left buttock.  She has recurrent back pain since 2008 and past kidney stones but feels this episode is consistent with her prior back pain. She has not had recent imaging.  She uses meloxicam  and tizanidine , which initially helped but pain returned. She has tried Salonpas patches. She has not started physical therapy and reports little benefit from a chiropractor. She is cautious with oral NSAIDs due to gastrointestinal concerns.  She has asthma treated with Fasenra  injections for two years, which has reduced prednisone  use. She denies diabetes, urinary problems, or dysuria.       09/27/2024   12:59 PM 06/08/2024    9:35 AM 03/08/2024    8:25 AM  Depression screen PHQ 2/9  Decreased Interest 0 0 0  Down, Depressed,  Hopeless 0 0 0  PHQ - 2 Score 0 0 0  Altered sleeping 1 0 1  Tired, decreased energy 1 1 1   Change in appetite 1 0 1  Feeling bad or failure about yourself  0 0 0  Trouble concentrating 0 0 0  Moving slowly or fidgety/restless 0 0 0  Suicidal thoughts 0 0 0  PHQ-9 Score 3 1  3    Difficult doing work/chores Not difficult at all Not difficult at all Not difficult at all     Data saved with a previous flowsheet row definition      09/27/2024   12:59 PM 06/08/2024    9:35 AM 03/08/2024    8:26 AM  GAD 7 : Generalized Anxiety Score  Nervous, Anxious, on Edge 0 1 1  Control/stop worrying 0 0 0  Worry too much - different things 0 0 1  Trouble relaxing 1 1 1   Restless 1 0 0  Easily annoyed or irritable 0 0 0  Afraid - awful might happen 0 0 0  Total GAD 7 Score 2 2 3   Anxiety Difficulty Not difficult at all Not difficult at all Not difficult at all    Medications: Show/hide medication list[1]  Review of Systems All negative Except see HPI       Objective    BP 120/63   Pulse 72   Resp 14  Ht 5' 9 (1.753 m)   Wt 194 lb 9.6 oz (88.3 kg)   LMP 02/10/2015 Comment: 1st one since dec 2014  SpO2 99%   BMI 28.74 kg/m     Physical Exam Constitutional:      General: She is not in acute distress.    Appearance: Normal appearance.  HENT:     Head: Normocephalic.  Pulmonary:     Effort: Pulmonary effort is normal. No respiratory distress.  Musculoskeletal:        General: Tenderness (left lower back) present. No swelling or deformity.  Neurological:     Mental Status: She is alert and oriented to person, place, and time. Mental status is at baseline.      No results found for any visits on 10/04/24.      Assessment & Plan Acute left-sided low back pain Pain exacerbated by movement Previous chiropractic treatment ineffective. Imaging not performed. - Prescribed prednisone  40 mg for 5 days, then 20 mg for 5 days. - Referred to physical therapy for manual therapy  and exercises. - Advised use of topical Voltaren gel. - Instructed to avoid NSAIDs until after prednisone  course. - Advised against strenuous activities, lifting, bending, and improper movements. Consider imaging Consider a referral to ortho if symptoms persist - Scheduled follow-up with Doctor Pardue in January. Will follow-up   No orders of the defined types were placed in this encounter.   No follow-ups on file.   The patient was advised to call back or seek an in-person evaluation if the symptoms worsen or if the condition fails to improve as anticipated.  I discussed the assessment and treatment plan with the patient. The patient was provided an opportunity to ask questions and all were answered. The patient agreed with the plan and demonstrated an understanding of the instructions.  I, Caidance Sybert, PA-C have reviewed all documentation for this visit. The documentation on 10/04/2024  for the exam, diagnosis, procedures, and orders are all accurate and complete.  Jolynn Spencer, [facility], MMS [facility] [phone] (phone) [phone] (fax)  [facility]     [1]  Outpatient Medications Prior to Visit  Medication Sig   albuterol  (PROVENTIL ) (2.5 MG/3ML) 0.083% nebulizer solution TAKE 3 MLS BY NEBULIZATION EVERY 6 HOURS AS NEEDED FOR WHEEZING OR SHORTNESS OF BREATH.   albuterol  (VENTOLIN  HFA) 108 (90 Base) MCG/ACT inhaler TAKE 2 PUFFS BY MOUTH EVERY 6 HOURS AS NEEDED FOR WHEEZE OR SHORTNESS OF BREATH   benralizumab  (FASENRA  PEN) 30 MG/ML prefilled autoinjector Inject 1 mL (30 mg total) into the skin every 8 (eight) weeks.   BREO ELLIPTA  200-25 MCG/ACT AEPB TAKE 1 PUFF BY MOUTH EVERY DAY   cetirizine (ZYRTEC) 10 MG tablet Take 10 mg by mouth at bedtime. Reported on 05/01/2016   esomeprazole  (NEXIUM ) 20 MG capsule Take 20 mg by mouth daily at 12 noon.   fluticasone  (FLONASE ) 50 MCG/ACT nasal spray Place 2 sprays into both nostrils daily.   meloxicam   (MOBIC ) 7.5 MG tablet Take 1-2 tablets (7.5-15 mg total) by mouth daily.   tiZANidine  (ZANAFLEX ) 4 MG tablet Take 1 tablet (4 mg total) by mouth every 6 (six) hours as needed for muscle spasms.   No facility-administered medications prior to visit.

## 2024-10-06 ENCOUNTER — Encounter: Payer: Self-pay | Admitting: Physician Assistant

## 2024-10-08 ENCOUNTER — Ambulatory Visit: Payer: Self-pay

## 2024-10-08 NOTE — Telephone Encounter (Signed)
 FYI Only or Action Required?: Action required by provider: clinical question for provider.  Patient was last seen in primary care on 10/04/2024 by Ostwalt, Janna, PA-C.  Called Nurse Triage reporting Back Pain.  Symptoms began several days ago.  Interventions attempted: Prescription medications:  SABRA  Symptoms are: unchanged. Continues to have low back pain.Last seen 10/04/24. Has been prescribed Prednisone , Meloxicam , muscle relaxer. Asking if she should be taking all these medicines. No relief from her pain. Please advise pt.  Triage Disposition: See HCP Within 4 Hours (Or PCP Triage)  Patient/caregiver understands and will follow disposition?: No, wishes to speak with PCPCopied from CRM #[id number]. Topic: Clinical - Red Word Triage >> Oct 08, 2024  7:48 AM Deaijah H wrote: Red Word that prompted transfer to Nurse Triage: Patient called in due to having questions regarding medication.. Doesn't think prednisone  is working. Lower back now spreading to right side. Unsure if she can take meloxicam  while taking prednisone . Reason for Disposition  [1] SEVERE back pain (e.g., excruciating, unable to do any normal activities) AND [2] not improved 2 hours after pain medicine  Answer Assessment - Initial Assessment Questions 1. ONSET: When did the pain begin? (e.g., minutes, hours, days)     Several weeks 2. LOCATION: Where does it hurt? (upper, mid or lower back)     lower 3. SEVERITY: How bad is the pain?  (e.g., Scale 1-10; mild, moderate, or severe)     Now - 8 4. PATTERN: Is the pain constant? (e.g., yes, no; constant, intermittent)      constant 5. RADIATION: Does the pain shoot into your legs or somewhere else?     no 6. CAUSE:  What do you think is causing the back pain?      unsure 7. BACK OVERUSE:  Any recent lifting of heavy objects, strenuous work or exercise?     no 8. MEDICINES: What have you taken so far for the pain? (e.g., nothing, acetaminophen , NSAIDS)      yes 9. NEUROLOGIC SYMPTOMS: Do you have any weakness, numbness, or problems with bowel/bladder control?     no 10. OTHER SYMPTOMS: Do you have any other symptoms? (e.g., fever, abdomen pain, burning with urination, blood in urine)       no 11. PREGNANCY: Is there any chance you are pregnant? When was your last menstrual period?       no  Protocols used: Back Pain-A-AH

## 2024-11-02 ENCOUNTER — Encounter: Payer: Self-pay | Admitting: Family Medicine

## 2024-11-02 ENCOUNTER — Ambulatory Visit
Admission: RE | Admit: 2024-11-02 | Discharge: 2024-11-02 | Disposition: A | Discharge status: Home / Self Care | Attending: Family Medicine | Admitting: Family Medicine

## 2024-11-02 ENCOUNTER — Ambulatory Visit: Admitting: Family Medicine

## 2024-11-02 ENCOUNTER — Ambulatory Visit
Admission: RE | Admit: 2024-11-02 | Discharge: 2024-11-02 | Disposition: A | Source: Ambulatory Visit | Attending: Family Medicine | Admitting: Family Medicine

## 2024-11-02 VITALS — BP 137/87 | HR 71 | Temp 98.1°F | Ht 69.0 in | Wt 194.7 lb

## 2024-11-02 DIAGNOSIS — Z1159 Encounter for screening for other viral diseases: Secondary | ICD-10-CM

## 2024-11-02 DIAGNOSIS — E7800 Pure hypercholesterolemia, unspecified: Secondary | ICD-10-CM

## 2024-11-02 DIAGNOSIS — Z23 Encounter for immunization: Secondary | ICD-10-CM

## 2024-11-02 DIAGNOSIS — M545 Low back pain, unspecified: Secondary | ICD-10-CM

## 2024-11-02 DIAGNOSIS — D649 Anemia, unspecified: Secondary | ICD-10-CM

## 2024-11-02 MED ORDER — METHYLPREDNISOLONE ACETATE 40 MG/ML IJ SUSP
40.0000 mg | Freq: Once | INTRAMUSCULAR | Status: AC
  Administered 2024-11-02: 40 mg via INTRAMUSCULAR

## 2024-11-02 MED ORDER — CYCLOBENZAPRINE HCL 5 MG PO TABS: 5.0000 mg | ORAL_TABLET | Freq: Every evening | ORAL | 0 refills | Status: AC | PRN

## 2024-11-02 MED ORDER — METHYLPREDNISOLONE 4 MG PO TBPK: ORAL_TABLET | ORAL | 0 refills | Status: AC

## 2024-11-02 NOTE — Progress Notes (Signed)
 "     Established patient visit   Patient: Angela Reyes   DOB: 05/04/1961   64 y.o. Female  MRN: [id number] Visit Date: 11/02/2024  Today's healthcare provider: LAURAINE LOISE BUOY, DO   Chief Complaint  Patient presents with   Back Pain    Patient is here today for a month follow up on lower back pain, states that it has not been great.  Doing physical therapy and still complains of pain, no relief.  States that she started feeling okay but now she is back to where she was with the pain back around Thanksgiving.  Shingles- wants to discuss   Subjective    Back Pain   Angela Reyes is a 64 year old female who presents with persistent back pain despite physical therapy.  She experiences persistent right-sided back pain that has not improved with physical therapy. Initially, she had some relief after one session, but the pain returned and is now throbbing, primarily on the right side. The pain is centered but more pronounced on the right side.  She attends physical therapy sessions two to three times a week and has completed five sessions. Certain movements, such as leg swings, exacerbate the pain, leading to modifications in her therapy regimen.  She has been prescribed meloxicam  and prednisone . She followed the prescribed prednisone  regimen of two tablets for seven days followed by one tablet, but is uncertain if it provided any relief. She also uses a muscle relaxer, which she takes at night or on weekends due to its sedative effects, but avoids it during workdays.  No bowel or bladder issues, and no numbness or tingling in her legs. However, she experiences difficulty sleeping due to the pain, which affects her ability to change positions in bed.  She has a history of asthma and has previously used prednisone  for asthma management before starting Fasenra  injections.      Medications: Show/hide medication list[1]  Review of Systems  Musculoskeletal:  Positive for back pain.        Objective    BP 137/87 (BP Location: Right Arm, Patient Position: Sitting, Cuff Size: Normal)   Pulse 71   Temp 98.1 F (36.7 C) (Oral)   Ht 5' 9 (1.753 m)   Wt 194 lb 11.2 oz (88.3 kg)   LMP 02/10/2015 Comment: 1st one since dec 2014  SpO2 99%   BMI 28.75 kg/m     Physical Exam Vitals and nursing note reviewed.  Constitutional:      General: She is not in acute distress.    Appearance: Normal appearance.  HENT:     Head: Normocephalic and atraumatic.  Eyes:     General: No scleral icterus.    Conjunctiva/sclera: Conjunctivae normal.  Cardiovascular:     Rate and Rhythm: Normal rate.  Pulmonary:     Effort: Pulmonary effort is normal.  Musculoskeletal:     Cervical back: Normal.     Thoracic back: Normal.     Lumbar back: Tenderness present. No spasms or bony tenderness. Decreased range of motion (d/t pain). Negative right straight leg raise test and negative left straight leg raise test.       Back:  Neurological:     Mental Status: She is alert and oriented to person, place, and time. Mental status is at baseline.  Psychiatric:        Mood and Affect: Mood normal.        Behavior: Behavior normal.      No  results found for any visits on 11/02/24.  Assessment & Plan    Acute right-sided low back pain without sciatica -     DG Lumbar Spine Complete; Future -     Ambulatory referral to Orthopedic Surgery -     methylPREDNISolone  Acetate -     methylPREDNISolone ; Take 6 pills on day 1, 5 pills on day 2, 4 pills on day 3, 3 pills on day 4, 2 pills on day 5, 1 pill on day 6  Dispense: 1 each; Refill: 0 -     Cyclobenzaprine  HCl; Take 1 tablet (5 mg total) by mouth at bedtime as needed and may repeat dose one time if needed for muscle spasms.  Dispense: 30 tablet; Refill: 0   Acute right-sided low back pain without sciatica Persistent pain despite previous treatments with meloxicam , tizanidine , prednisone  and physical therapy. - Ordered lumbar spine  x-ray. - Referred to orthopedics. - Administered methylprednisolone  injection. - Prescribed methylprednisolone  taper pack. - Prescribed cyclobenzaprine  to replace tizanidine . - Advised against ibuprofen with meloxicam .  Hold meloxicam  during methylprednisolone  taper pack  General Health Maintenance Discussed COVID booster and shingles vaccine. Hepatitis C screening offered. - Recommended shingles vaccine before age 57. - Discussed COVID booster options. - Offered hepatitis C screening; deferred for the time being.    Return in about 4 months (around 03/02/2025) for Chronic f/u w/next provider.      I discussed the assessment and treatment plan with the patient  The patient was provided an opportunity to ask questions and all were answered. The patient agreed with the plan and demonstrated an understanding of the instructions.   The patient was advised to call back or seek an in-person evaluation if the symptoms worsen or if the condition fails to improve as anticipated.    LAURAINE LOISE BUOY, DO  [facility] [phone] (phone) [phone] (fax)  [facility]    [1]  Outpatient Medications Prior to Visit  Medication Sig   albuterol  (PROVENTIL ) (2.5 MG/3ML) 0.083% nebulizer solution TAKE 3 MLS BY NEBULIZATION EVERY 6 HOURS AS NEEDED FOR WHEEZING OR SHORTNESS OF BREATH.   albuterol  (VENTOLIN  HFA) 108 (90 Base) MCG/ACT inhaler TAKE 2 PUFFS BY MOUTH EVERY 6 HOURS AS NEEDED FOR WHEEZE OR SHORTNESS OF BREATH   benralizumab  (FASENRA  PEN) 30 MG/ML prefilled autoinjector Inject 1 mL (30 mg total) into the skin every 8 (eight) weeks.   BREO ELLIPTA  200-25 MCG/ACT AEPB TAKE 1 PUFF BY MOUTH EVERY DAY   cetirizine (ZYRTEC) 10 MG tablet Take 10 mg by mouth at bedtime. Reported on 05/01/2016   esomeprazole  (NEXIUM ) 20 MG capsule Take 20 mg by mouth daily at 12 noon.   fluticasone  (FLONASE ) 50 MCG/ACT nasal spray Place 2 sprays into both nostrils daily.    meloxicam  (MOBIC ) 7.5 MG tablet Take 1-2 tablets (7.5-15 mg total) by mouth daily.   [DISCONTINUED] predniSONE  (DELTASONE ) 20 MG tablet Take 1 tablet (20 mg total) by mouth daily with breakfast.   [DISCONTINUED] tiZANidine  (ZANAFLEX ) 4 MG tablet Take 1 tablet (4 mg total) by mouth every 6 (six) hours as needed for muscle spasms.   No facility-administered medications prior to visit.   "

## 2024-11-05 ENCOUNTER — Ambulatory Visit: Admitting: Pulmonary Disease

## 2024-11-05 ENCOUNTER — Encounter: Payer: Self-pay | Admitting: Pulmonary Disease

## 2024-11-05 VITALS — BP 128/82 | HR 71 | Ht 69.0 in | Wt 194.5 lb

## 2024-11-05 DIAGNOSIS — J3089 Other allergic rhinitis: Secondary | ICD-10-CM | POA: Diagnosis not present

## 2024-11-05 DIAGNOSIS — J4540 Moderate persistent asthma, uncomplicated: Secondary | ICD-10-CM

## 2024-11-05 DIAGNOSIS — J4550 Severe persistent asthma, uncomplicated: Secondary | ICD-10-CM

## 2024-11-05 MED ORDER — BREO ELLIPTA 100-25 MCG/ACT IN AEPB: 1.0000 | INHALATION_SPRAY | Freq: Every day | RESPIRATORY_TRACT | 5 refills | Status: AC

## 2024-11-05 NOTE — Progress Notes (Signed)
 "  Subjective:    Patient ID: Angela Reyes, female    DOB: 1960/11/23, 64 y.o.   MRN: [id number]  Patient Care Team: Donzella Lauraine SAILOR, DO as PCP - General (Family Medicine) Tamea Dedra LITTIE, MD as Consulting Physician (Pulmonary Disease)  Chief Complaint  Patient presents with   Follow-up    Asthma, on Fasenra     BACKGROUND/INTERVAL:64 year old lifelong never smoker with severe persistent asthma, eosinophilic type, currently on Fasenra , started on 27 August 2022. Follows up from her visit of 15 April 2024.     HPI Discussed the use of AI scribe software for clinical note transcription with the patient, who gave verbal consent to proceed.  History of Present Illness   Angela Reyes is a 64 year old female with asthma who presents for follow-up of her asthma management.  Her asthma is currently well-controlled with no recent exacerbations. She attributes this improvement to the use of Fasenra . She continues to use her inhaler once a day (Breo Ellipta  200) and takes Zyrtec regularly.  She is experiencing lower back pain for about six weeks, localized to the lower back without radiation to her legs. The pain affects her ability to turn in bed, and she uses a pillow under her legs for relief. She is attending physical therapy and has an upcoming appointment with an orthopedic specialist. An x-ray was performed, but results are pending. She is currently taking prednisone  for the back pain.     DATA 05/15/2022 allergen panel: IgE 5 IU/mL, no allergens identified, Aspergillus antibody negative. 07/23/2022 nitric oxide : 220 ppb 07/23/2022 immunoglobulins: Normal. 07/23/2022 CBC with differential: Eosinophils absolute 1.5 K/uL. 07/23/2022 chest x-ray PA and lateral: No acute cardiopulmonary disease. 08/13/2022 PFTs: FEV1 2.85 L or 92% predicted, FVC 3.68 L or 92% predicted, FEV1/FVC 77%, no significant bronchodilator response.  Lung volumes at lower limit of normal, diffusion capacity  normal.  Essentially within normal limits. 08/14/2022 nitric oxide : 126 ppb 08/27/2022 Fasenra : started 09/23/2022 nitric oxide : 52 ppb 11/04/2022 nitric oxide : 53 ppb 12/30/2022 nitric oxide : 62 ppb 06/06/2023 nitric oxide : 40 ppb 12/25/2023 nitirc oxide: 33 ppb 04/15/2024 nitric oxide : 35 ppb  Review of Systems A 10 point review of systems was performed and it is as noted above otherwise negative.   Patient Active Problem List   Diagnosis Date Noted   Elevated LDL cholesterol level 06/08/2024   Upper respiratory tract infection 05/13/2023   Moderate persistent asthma with exacerbation 12/04/2021   Pain around toenail, left foot 09/03/2021   Acute right-sided low back pain without sciatica 09/03/2021   Toenail fungus 09/03/2021   Allergic rhinitis 05/01/2015   Absolute anemia 03/06/2015   At risk for osteoporosis 03/06/2015   Long term current use of systemic steroids 03/06/2015   Acid reflux 03/06/2015   External hemorrhoid 03/06/2015   Plantar fasciitis 03/06/2015   Severe persistent asthma (HCC) 01/27/2014   Lung nodule 10/14/2013   Chronic cough 09/12/2013   Asthma, cough variant 06/30/2009   Fatty tumor 12/08/2008   Adiposity 12/06/2008   Anxiety 11/17/2008   Adaptive colitis 11/17/2008   Low back pain 11/17/2008    Social History   Tobacco Use   Smoking status: Never   Smokeless tobacco: Never  Substance Use Topics   Alcohol use: No    Allergies[1]  Active Medications[2]  Immunization History  Administered Date(s) Administered   Influenza,inj,Quad PF,6+ Mos 11/21/2014, 07/23/2017   Moderna Sars-Covid-2 Vaccination 12/29/2019, 01/26/2020   Pneumococcal Polysaccharide-23 08/21/2012, 03/03/2013   Tdap 11/21/2014  Objective:     Vitals:   11/05/24 0905  BP: 128/82  Pulse: 71  Height: 5' 9 (1.753 m)  Weight: 194 lb 8 oz (88.2 kg)  SpO2: 96%  BMI (Calculated): 28.71     SpO2: 96 %  GENERAL: Well-developed, well-nourished woman, no  acute distress.  No tachypnea or conversational dyspnea.  Fluent speech.  Mild nasal quality to speech today. HEAD: Normocephalic, atraumatic.  EYES: Pupils equal, round, reactive to light.  No scleral icterus.  MOUTH: Dentition intact, oral mucosa moist.  No thrush. NECK: Supple. No thyromegaly. Trachea midline. No JVD.  No adenopathy. PULMONARY: Good air entry bilaterally.  No adventitious sounds. CARDIOVASCULAR: S1 and S2. Regular rate and rhythm.  No rubs, murmurs or gallops heard. ABDOMEN: Benign. MUSCULOSKELETAL: No joint deformity, no clubbing, no edema.  NEUROLOGIC: No overt focal deficit, no gait disturbance, speech is fluent. SKIN: Intact,warm,dry. PSYCH: Mood and behavior normal.        Assessment & Plan:     ICD-10-CM   1. Severe persistent asthma without complication (HCC)  J45.50     2. Perennial allergic rhinitis  J30.89      Meds ordered this encounter  Medications   BREO ELLIPTA  100-25 MCG/ACT AEPB    Sig: Inhale 1 puff into the lungs daily.    Dispense:  60 each    Refill:  5   Discussion:    Moderate persistent asthma Asthma is well-controlled with current treatment regimen. Fasenra  has been effective in managing symptoms. She uses her inhaler once daily. Consideration to reduce steroid load by decreasing Breo dosage due to good control with Fasenra . - Decreased Breo dosage from 200 mcg to 100 mcg. - Continue Fasenra  as prescribed. - Monitor for any issues with decreased Breo dosage and report if problems arise.     Follow-up in 6 months time or as needed.  Advised if symptoms do not improve or worsen, to please contact office for sooner follow up or seek emergency care.    I spent 32 minutes of dedicated to the care of this patient on the date of this encounter to include pre-visit review of records, face-to-face time with the patient discussing conditions above, post visit ordering of testing, clinical documentation with the electronic health record,  making appropriate referrals as documented, and communicating necessary findings to members of the patients care team.     C. Leita Sanders, MD Advanced Bronchoscopy [facility]    *This note was generated using voice recognition software/Dragon and/or AI transcription program.  Despite best efforts to proofread, errors can occur which can change the meaning. Any transcriptional errors that result from this process are unintentional and may not be fully corrected at the time of dictation.     [1] No Known Allergies [2]  Current Meds  Medication Sig   albuterol  (PROVENTIL ) (2.5 MG/3ML) 0.083% nebulizer solution TAKE 3 MLS BY NEBULIZATION EVERY 6 HOURS AS NEEDED FOR WHEEZING OR SHORTNESS OF BREATH.   albuterol  (VENTOLIN  HFA) 108 (90 Base) MCG/ACT inhaler TAKE 2 PUFFS BY MOUTH EVERY 6 HOURS AS NEEDED FOR WHEEZE OR SHORTNESS OF BREATH   benralizumab  (FASENRA  PEN) 30 MG/ML prefilled autoinjector Inject 1 mL (30 mg total) into the skin every 8 (eight) weeks.   BREO ELLIPTA  100-25 MCG/ACT AEPB Inhale 1 puff into the lungs daily.   cetirizine (ZYRTEC) 10 MG tablet Take 10 mg by mouth at bedtime. Reported on 05/01/2016   cyclobenzaprine  (FLEXERIL ) 5 MG tablet Take 1 tablet (5 mg  total) by mouth at bedtime as needed and may repeat dose one time if needed for muscle spasms.   esomeprazole  (NEXIUM ) 20 MG capsule Take 20 mg by mouth daily at 12 noon.   fluticasone  (FLONASE ) 50 MCG/ACT nasal spray Place 2 sprays into both nostrils daily.   meloxicam  (MOBIC ) 7.5 MG tablet Take 1-2 tablets (7.5-15 mg total) by mouth daily.   methylPREDNISolone  (MEDROL  DOSEPAK) 4 MG TBPK tablet Take 6 pills on day 1, 5 pills on day 2, 4 pills on day 3, 3 pills on day 4, 2 pills on day 5, 1 pill on day 6   [DISCONTINUED] BREO ELLIPTA  200-25 MCG/ACT AEPB TAKE 1 PUFF BY MOUTH EVERY DAY   "

## 2024-11-05 NOTE — Patient Instructions (Signed)
 VISIT SUMMARY:  ANICIA LEUTHOLD, a 64 year old female with asthma, came in for a follow-up on her asthma management. Her asthma is well-controlled with no recent exacerbations, and she attributes this improvement to the use of Fasenra . She also reported experiencing lower back pain for about six weeks, which affects her ability to turn in bed. She is attending physical therapy and has an upcoming appointment with an orthopedic specialist. An x-ray was performed, but results are pending. She is currently taking prednisone  for the back pain.  YOUR PLAN:  - SEVERE PERSISTENT ASTHMA: Asthma is a condition where your airways narrow and swell, producing extra mucus, which can make breathing difficult. Your asthma is well-controlled with your current treatment regimen, including Fasenra  and your inhaler. We have decreased your Breo dosage from 200 mcg to 100 mcg due to good control with Fasenra . Please continue using Fasenra  as prescribed and monitor for any issues with the decreased Breo dosage. Report any problems if they arise.  Please let us  know if you have any difficulties with [employer].  -LOWER BACK PAIN: Lower back pain can be caused by various factors, including muscle strain or underlying conditions. You have been experiencing this pain for about six weeks, and it affects your ability to turn in bed. You are attending physical therapy and have an upcoming appointment with an orthopedic specialist. An x-ray was performed, and we are waiting for the results. You are currently taking prednisone  for the back pain. Continue with physical therapy and follow up with the orthopedic specialist as planned.  INSTRUCTIONS:  Please monitor for any issues with the decreased Breo dosage and report if problems arise. Continue with physical therapy and follow up with the orthopedic specialist as planned. Await the results of your x-ray for further evaluation of your lower back pain.

## 2024-11-13 DIAGNOSIS — M545 Low back pain, unspecified: Secondary | ICD-10-CM

## 2024-11-15 NOTE — Telephone Encounter (Signed)
 Requested medications are due for refill today.  yes  Requested medications are on the active medications list.  yes  Last refill. 11/02/2024 #30 0 rf  Future visit scheduled.   yes  Notes to clinic.  Refill not delegated.    Requested Prescriptions  Pending Prescriptions Disp Refills   cyclobenzaprine  (FLEXERIL ) 5 MG tablet [Pharmacy Med Name: CYCLOBENZAPRINE  5 MG TABLET] 30 tablet 0    Sig: TAKE 1 TABLET BY MOUTH AT BEDTIME AS NEEDED AND MAY REPEAT DOSE 1 TIME IF NEEDED FOR MUSCLE SPASMS.     Not Delegated - Analgesics:  Muscle Relaxants Failed - 11/15/2024 12:06 PM      Failed - This refill cannot be delegated      Passed - Valid encounter within last 6 months    Recent Outpatient Visits           1 week ago Acute right-sided low back pain without sciatica   [facility] [place], Lauraine SAILOR, DO   1 month ago Acute left-sided low back pain without sciatica   [facility] [place], [place], PA-C   1 month ago Left lumbar pain    [facility] [place], [place], MD   5 months ago Annual physical exam   [facility] [place], DO   8 months ago Acute recurrent maxillary sinusitis   [facility] [place], Lauraine SAILOR, DO

## 2024-11-24 ENCOUNTER — Other Ambulatory Visit: Payer: Self-pay

## 2024-11-25 NOTE — Addendum Note (Signed)
 Addended by: Matt Delpizzo L on: 11/25/2024 08:31 AM   Modules accepted: Orders

## 2024-11-26 ENCOUNTER — Ambulatory Visit

## 2024-11-26 ENCOUNTER — Other Ambulatory Visit: Payer: Self-pay

## 2024-11-26 ENCOUNTER — Other Ambulatory Visit (HOSPITAL_COMMUNITY): Payer: Self-pay

## 2024-11-26 DIAGNOSIS — Z79899 Other long term (current) drug therapy: Secondary | ICD-10-CM

## 2024-11-26 DIAGNOSIS — J4550 Severe persistent asthma, uncomplicated: Secondary | ICD-10-CM

## 2024-11-26 DIAGNOSIS — D721 Eosinophilia, unspecified: Secondary | ICD-10-CM

## 2024-11-26 MED ORDER — FASENRA PEN 30 MG/ML ~~LOC~~ SOAJ
30.0000 mg | SUBCUTANEOUS | 0 refills | Status: AC
  Filled 2024-11-26: qty 1, 56d supply, fill #0

## 2024-11-26 NOTE — Progress Notes (Signed)
 Specialty Pharmacy Refill Coordination Note  Angela Reyes is a 64 y.o. female contacted today regarding refills of specialty medication(s) Benralizumab  (Fasenra  Pen)   Patient requested Delivery   Delivery date: 12/09/24   Verified address: [address] [place] [address]   Medication will be filled on: 12/08/24

## 2024-11-26 NOTE — Progress Notes (Signed)
 [facility] - Continuation of Therapy with Biologic  Referring Provider: Dedra Sanders (received verbal authorization for referral)  Virtual Visit via Telephone Note  I connected with Angela Reyes on 11/26/24 at  4:00 PM EST by telephone and verified that I am speaking with the correct person using two identifiers.  Location: Patient: home Provider: office   I discussed the limitations, risks, security and privacy concerns of performing an evaluation and management service by telephone and the availability of in person appointments. I also discussed with the patient that there may be a patient responsible charge related to this service. The patient expressed understanding and agreed to proceed.  HPI: Angela Reyes is a 64 y.o. female who presents to the pharmacotherapy clinic via telephone for continuation of therapy with Fasenra . Last OV with Dr. Sanders was on 11/05/24.   Indication: asthma Dosing: 30mg  every 8 weeks  Patient's current respiratory regimen: Breo  Patient Active Problem List   Diagnosis Date Noted   Elevated LDL cholesterol level 06/08/2024   Upper respiratory tract infection 05/13/2023   Moderate persistent asthma with exacerbation 12/04/2021   Pain around toenail, left foot 09/03/2021   Acute right-sided low back pain without sciatica 09/03/2021   Toenail fungus 09/03/2021   Allergic rhinitis 05/01/2015   Absolute anemia 03/06/2015   At risk for osteoporosis 03/06/2015   Long term current use of systemic steroids 03/06/2015   Acid reflux 03/06/2015   External hemorrhoid 03/06/2015   Plantar fasciitis 03/06/2015   Severe persistent asthma (HCC) 01/27/2014   Lung nodule 10/14/2013   Chronic cough 09/12/2013   Asthma, cough variant 06/30/2009   Fatty tumor 12/08/2008   Adiposity 12/06/2008   Anxiety 11/17/2008   Adaptive colitis 11/17/2008   Low back pain 11/17/2008    Patient's Medications  New Prescriptions   No  medications on file  Previous Medications   ALBUTEROL  (PROVENTIL ) (2.5 MG/3ML) 0.083% NEBULIZER SOLUTION    TAKE 3 MLS BY NEBULIZATION EVERY 6 HOURS AS NEEDED FOR WHEEZING OR SHORTNESS OF BREATH.   ALBUTEROL  (VENTOLIN  HFA) 108 (90 BASE) MCG/ACT INHALER    TAKE 2 PUFFS BY MOUTH EVERY 6 HOURS AS NEEDED FOR WHEEZE OR SHORTNESS OF BREATH   BREO ELLIPTA  100-25 MCG/ACT AEPB    Inhale 1 puff into the lungs daily.   CETIRIZINE (ZYRTEC) 10 MG TABLET    Take 10 mg by mouth at bedtime. Reported on 05/01/2016   CYCLOBENZAPRINE  (FLEXERIL ) 5 MG TABLET    Take 1 tablet (5 mg total) by mouth at bedtime as needed and may repeat dose one time if needed for muscle spasms.   ESOMEPRAZOLE  (NEXIUM ) 20 MG CAPSULE    Take 20 mg by mouth daily at 12 noon.   FLUTICASONE  (FLONASE ) 50 MCG/ACT NASAL SPRAY    Place 2 sprays into both nostrils daily.   MELOXICAM  (MOBIC ) 7.5 MG TABLET    Take 1-2 tablets (7.5-15 mg total) by mouth daily.   METHYLPREDNISOLONE  (MEDROL  DOSEPAK) 4 MG TBPK TABLET    Take 6 pills on day 1, 5 pills on day 2, 4 pills on day 3, 3 pills on day 4, 2 pills on day 5, 1 pill on day 6  Modified Medications   Modified Medication Previous Medication   BENRALIZUMAB  (FASENRA  PEN) 30 MG/ML PREFILLED AUTOINJECTOR benralizumab  (FASENRA  PEN) 30 MG/ML prefilled autoinjector      Inject 1 mL (30 mg total) into the skin every 8 (eight) weeks.    Inject 1 mL (30 mg  total) into the skin every 8 (eight) weeks.  Discontinued Medications   No medications on file    Allergies: Allergies[1]  Past Medical History: Past Medical History:  Diagnosis Date   Asthma    Chronic rhinosinusitis    GERD (gastroesophageal reflux disease)    History of dysplastic nevus 04/26/2010   left mid to upper back, mod to severe/excised 06/13/10   History of dysplastic nevus 01/07/2018   right mid medial dorsum scapula, severe   History of gestational diabetes    History of hiatal hernia    OSA (obstructive sleep apnea)    non-compliant  cpap   Pulmonary nodule, right    middle lobe-- stable low risk--  pulmologist,  dr geronimo   Right ureteral stone     Social History: Social History   Socioeconomic History   Marital status: Married    Spouse name: Not on file   Number of children: Not on file   Years of education: Not on file   Highest education level: Bachelor's degree (e.g., BA, AB, BS)  Occupational History   Not on file  Tobacco Use   Smoking status: Never   Smokeless tobacco: Never  Substance and Sexual Activity   Alcohol use: No   Drug use: No   Sexual activity: Yes    Comment: friend has vasctomy  Other Topics Concern   Not on file  Social History Narrative   Not on file   Social Drivers of Health   Tobacco Use: Low Risk (11/05/2024)   Patient History    Smoking Tobacco Use: Never    Smokeless Tobacco Use: Never    Passive Exposure: Not on file  Financial Resource Strain: Low Risk (11/01/2024)   Overall Financial Resource Strain (CARDIA)    Difficulty of Paying Living Expenses: Not very hard  Food Insecurity: No Food Insecurity (11/01/2024)   Epic    Worried About Programme Researcher, Broadcasting/film/video in the Last Year: Never true    Ran Out of Food in the Last Year: Never true  Transportation Needs: No Transportation Needs (11/01/2024)   Epic    Lack of Transportation (Medical): No    Lack of Transportation (Non-Medical): No  Physical Activity: Insufficiently Active (11/01/2024)   Exercise Vital Sign    Days of Exercise per Week: 3 days    Minutes of Exercise per Session: 20 min  Stress: Stress Concern Present (11/01/2024)   [employer] of Occupational [employer] - Occupational Stress Questionnaire    Feeling of Stress: To some extent  Social Connections: Unknown (11/01/2024)   Social Connection and Isolation Panel    Frequency of Communication with Friends and Family: Three times a week    Frequency of Social Gatherings with Friends and Family: Once a week    Attends Religious Services: Patient  declined    Active Member of Clubs or Organizations: Patient declined    Attends Banker Meetings: Not on file    Marital Status: Married  Depression (PHQ2-9): Low Risk (11/02/2024)   Depression (PHQ2-9)    PHQ-2 Score: 1  Alcohol Screen: Low Risk (04/18/2022)   Alcohol Screen    Last Alcohol Screening Score (AUDIT): 1  Housing: Unknown (11/01/2024)   Epic    Unable to Pay for Housing in the Last Year: No    Number of Times Moved in the Last Year: Not on file    Homeless in the Last Year: No  Utilities: Not on file  Health Literacy: Not on file  Assessment/Plan: 1. Patient prescribed Fasenra  for asthma. Reviewed the medication with the patient, including the following:   Goals of therapy: Mechanism of action: humanized afucosylated, monoclonal antibody (IgG1, kappa) that binds to the alpha subunit of the interleukin-5 receptor. IL-5 is the major cytokine responsible for the growth and differentiation, recruitment, activation, and survival of eosinophils (a cell type associated with inflammation and an important component in the pathogenesis of asthma) Reviewed that Fasenra  is add-on medication and patient must continue maintenance inhaler regimen. Response to therapy: may take 4 months to determine efficacy. Discussed that patients generally feel improvement sooner than 4 months.  Side effects: antibody development (13%), headache (8%), pharyngitis (5%), fever (3%), injection site reaction (2.2%)  Dose: 30 mg subcutaneously at Week 0, Week 4, Week 8, then 30mg  every 8 weeks thereafter  Administration/Storage:   Reviewed administration sites of thigh or abdomen (at least 2-3 inches away from abdomen). Reviewed the upper arm is only appropriate if caregiver is administering injection  Do not shake the pen/syringe as this could lead to product foaming or precipitation. \ Reviewed storage of medication in refrigerator. Reviewed that Fasenra  can be stored at room  temperature in unopened carton for up to 14 days.   Plan:  - Patient will continue injections every 8 weeks at home.  - Rx will be triaged to [facility] for delivery to patient home.   I discussed the assessment and treatment plan with the patient. The patient was provided an opportunity to ask questions and all were answered. The patient agreed with the plan and demonstrated an understanding of the instructions.   The patient was advised to call back or seek an in-person evaluation if the symptoms worsen or if the condition fails to improve as anticipated.  I provided 10 minutes of non-face-to-face time during this encounter.  Patient verbalizes understanding and agreement with plan.   Angela Brow, PharmD, CSP, AAHIVP, CPP Clinical Pharmacist Practitioner - Medication Therapy Disease Management/Specialty Pharmacy Services 11/26/2024, 4:13 PM     [1] No Known Allergies

## 2025-03-02 ENCOUNTER — Ambulatory Visit: Admitting: Family Medicine
